# Patient Record
Sex: Male | Born: 1945 | Race: White | Hispanic: No | Smoking: Current every day smoker
Health system: Southern US, Community
[De-identification: ages and names within clinical notes are randomized; demographics above are authoritative.]

## PROBLEM LIST (undated history)

## (undated) DIAGNOSIS — C049 Malignant neoplasm of floor of mouth, unspecified: Secondary | ICD-10-CM

## (undated) DIAGNOSIS — M47812 Spondylosis without myelopathy or radiculopathy, cervical region: Secondary | ICD-10-CM

## (undated) DIAGNOSIS — Z923 Personal history of irradiation: Secondary | ICD-10-CM

## (undated) DIAGNOSIS — C61 Malignant neoplasm of prostate: Secondary | ICD-10-CM

## (undated) DIAGNOSIS — IMO0002 Reserved for concepts with insufficient information to code with codable children: Secondary | ICD-10-CM

## (undated) DIAGNOSIS — I10 Essential (primary) hypertension: Secondary | ICD-10-CM

## (undated) DIAGNOSIS — Z862 Personal history of diseases of the blood and blood-forming organs and certain disorders involving the immune mechanism: Secondary | ICD-10-CM

## (undated) DIAGNOSIS — Z811 Family history of alcohol abuse and dependence: Secondary | ICD-10-CM

## (undated) DIAGNOSIS — F32A Depression, unspecified: Secondary | ICD-10-CM

## (undated) DIAGNOSIS — F329 Major depressive disorder, single episode, unspecified: Secondary | ICD-10-CM

## (undated) HISTORY — DX: Personal history of diseases of the blood and blood-forming organs and certain disorders involving the immune mechanism: Z86.2

## (undated) HISTORY — DX: Reserved for concepts with insufficient information to code with codable children: IMO0002

## (undated) HISTORY — DX: Depression, unspecified: F32.A

## (undated) HISTORY — DX: Major depressive disorder, single episode, unspecified: F32.9

## (undated) HISTORY — PX: TONSILLECTOMY AND ADENOIDECTOMY: SHX28

## (undated) HISTORY — DX: Family history of alcohol abuse and dependence: Z81.1

---

## 2005-10-01 DIAGNOSIS — C049 Malignant neoplasm of floor of mouth, unspecified: Secondary | ICD-10-CM

## 2005-10-01 HISTORY — PX: LESION REMOVAL: SHX5196

## 2005-10-01 HISTORY — DX: Malignant neoplasm of floor of mouth, unspecified: C04.9

## 2006-08-07 ENCOUNTER — Ambulatory Visit (HOSPITAL_COMMUNITY): Admission: RE | Admit: 2006-08-07 | Discharge: 2006-08-08 | Payer: Self-pay | Admitting: Otolaryngology

## 2006-09-06 ENCOUNTER — Ambulatory Visit: Admission: RE | Admit: 2006-09-06 | Discharge: 2006-11-27 | Payer: Self-pay | Admitting: *Deleted

## 2011-10-11 DIAGNOSIS — Z23 Encounter for immunization: Secondary | ICD-10-CM | POA: Diagnosis not present

## 2012-09-27 DIAGNOSIS — Z23 Encounter for immunization: Secondary | ICD-10-CM | POA: Diagnosis not present

## 2013-10-19 DIAGNOSIS — J040 Acute laryngitis: Secondary | ICD-10-CM | POA: Diagnosis not present

## 2013-10-27 DIAGNOSIS — H66009 Acute suppurative otitis media without spontaneous rupture of ear drum, unspecified ear: Secondary | ICD-10-CM | POA: Diagnosis not present

## 2013-12-09 DIAGNOSIS — J029 Acute pharyngitis, unspecified: Secondary | ICD-10-CM | POA: Diagnosis not present

## 2014-05-10 DIAGNOSIS — R5381 Other malaise: Secondary | ICD-10-CM | POA: Diagnosis not present

## 2014-05-10 DIAGNOSIS — Z131 Encounter for screening for diabetes mellitus: Secondary | ICD-10-CM | POA: Diagnosis not present

## 2014-05-10 DIAGNOSIS — Z125 Encounter for screening for malignant neoplasm of prostate: Secondary | ICD-10-CM | POA: Diagnosis not present

## 2014-05-10 DIAGNOSIS — M2550 Pain in unspecified joint: Secondary | ICD-10-CM | POA: Diagnosis not present

## 2014-05-10 DIAGNOSIS — Z136 Encounter for screening for cardiovascular disorders: Secondary | ICD-10-CM | POA: Diagnosis not present

## 2014-07-14 DIAGNOSIS — J410 Simple chronic bronchitis: Secondary | ICD-10-CM | POA: Diagnosis not present

## 2014-07-14 DIAGNOSIS — J040 Acute laryngitis: Secondary | ICD-10-CM | POA: Diagnosis not present

## 2014-07-14 DIAGNOSIS — Z87891 Personal history of nicotine dependence: Secondary | ICD-10-CM | POA: Diagnosis not present

## 2014-07-14 DIAGNOSIS — H903 Sensorineural hearing loss, bilateral: Secondary | ICD-10-CM | POA: Diagnosis not present

## 2014-07-14 DIAGNOSIS — H6063 Unspecified chronic otitis externa, bilateral: Secondary | ICD-10-CM | POA: Diagnosis not present

## 2014-07-14 DIAGNOSIS — J322 Chronic ethmoidal sinusitis: Secondary | ICD-10-CM | POA: Diagnosis not present

## 2014-07-21 DIAGNOSIS — N529 Male erectile dysfunction, unspecified: Secondary | ICD-10-CM | POA: Diagnosis not present

## 2014-07-21 DIAGNOSIS — E780 Pure hypercholesterolemia: Secondary | ICD-10-CM | POA: Diagnosis not present

## 2014-07-21 DIAGNOSIS — Z1389 Encounter for screening for other disorder: Secondary | ICD-10-CM | POA: Diagnosis not present

## 2014-07-21 DIAGNOSIS — Z1211 Encounter for screening for malignant neoplasm of colon: Secondary | ICD-10-CM | POA: Diagnosis not present

## 2014-07-21 DIAGNOSIS — R972 Elevated prostate specific antigen [PSA]: Secondary | ICD-10-CM | POA: Diagnosis not present

## 2014-07-21 DIAGNOSIS — Z23 Encounter for immunization: Secondary | ICD-10-CM | POA: Diagnosis not present

## 2014-07-21 DIAGNOSIS — F329 Major depressive disorder, single episode, unspecified: Secondary | ICD-10-CM | POA: Diagnosis not present

## 2014-07-21 DIAGNOSIS — Z0000 Encounter for general adult medical examination without abnormal findings: Secondary | ICD-10-CM | POA: Diagnosis not present

## 2014-07-26 DIAGNOSIS — H6523 Chronic serous otitis media, bilateral: Secondary | ICD-10-CM | POA: Diagnosis not present

## 2014-07-26 DIAGNOSIS — H6693 Otitis media, unspecified, bilateral: Secondary | ICD-10-CM | POA: Diagnosis not present

## 2014-08-17 DIAGNOSIS — R972 Elevated prostate specific antigen [PSA]: Secondary | ICD-10-CM | POA: Diagnosis not present

## 2014-08-17 DIAGNOSIS — N400 Enlarged prostate without lower urinary tract symptoms: Secondary | ICD-10-CM | POA: Diagnosis not present

## 2014-08-17 DIAGNOSIS — N5201 Erectile dysfunction due to arterial insufficiency: Secondary | ICD-10-CM | POA: Diagnosis not present

## 2014-09-29 DIAGNOSIS — R972 Elevated prostate specific antigen [PSA]: Secondary | ICD-10-CM | POA: Diagnosis not present

## 2014-10-08 DIAGNOSIS — R972 Elevated prostate specific antigen [PSA]: Secondary | ICD-10-CM | POA: Diagnosis not present

## 2014-10-08 DIAGNOSIS — C61 Malignant neoplasm of prostate: Secondary | ICD-10-CM | POA: Diagnosis not present

## 2014-10-15 DIAGNOSIS — C61 Malignant neoplasm of prostate: Secondary | ICD-10-CM | POA: Diagnosis not present

## 2014-10-18 ENCOUNTER — Other Ambulatory Visit (HOSPITAL_COMMUNITY): Payer: Self-pay | Admitting: Urology

## 2014-10-18 DIAGNOSIS — C61 Malignant neoplasm of prostate: Secondary | ICD-10-CM

## 2014-10-29 ENCOUNTER — Encounter (HOSPITAL_COMMUNITY)
Admission: RE | Admit: 2014-10-29 | Discharge: 2014-10-29 | Disposition: A | Discharge status: Home / Self Care | Payer: Medicare HMO | Source: Ambulatory Visit | Attending: Urology | Admitting: Urology

## 2014-10-29 ENCOUNTER — Encounter (HOSPITAL_COMMUNITY): Payer: Self-pay

## 2014-10-29 DIAGNOSIS — C61 Malignant neoplasm of prostate: Secondary | ICD-10-CM | POA: Insufficient documentation

## 2014-10-29 MED ORDER — TECHNETIUM TC 99M MEDRONATE IV KIT
26.0000 | PACK | Freq: Once | INTRAVENOUS | Status: AC | PRN
  Administered 2014-10-29: 26 via INTRAVENOUS

## 2014-11-03 DIAGNOSIS — C61 Malignant neoplasm of prostate: Secondary | ICD-10-CM | POA: Diagnosis not present

## 2014-11-03 DIAGNOSIS — N5201 Erectile dysfunction due to arterial insufficiency: Secondary | ICD-10-CM | POA: Diagnosis not present

## 2014-11-04 ENCOUNTER — Other Ambulatory Visit (HOSPITAL_COMMUNITY): Payer: Self-pay | Admitting: Urology

## 2014-11-04 DIAGNOSIS — C61 Malignant neoplasm of prostate: Secondary | ICD-10-CM

## 2014-11-15 ENCOUNTER — Telehealth: Payer: Self-pay | Admitting: Medical Oncology

## 2014-11-15 NOTE — Telephone Encounter (Signed)
I called pt to introduce myself as the Prostate Nurse Navigator and the Coordinator of the Prostate [place].  1. I confirmed with the patient he is aware of his referral to the clinic by [name]. We confirmed his appointments for 2/23 with arrival of 12:15pm.  2. I discussed the format of the clinic and the physicians he will be seeing that day.  3. I discussed where the clinic is located and how to contact me.  4. I confirmed his address and informed him I would be mailing a packet of information and forms to be completed. I asked him to bring them with him the day of his appointment.   He voiced understanding of the above. I asked him to call me if he has any questions or concerns regarding his appointments or the forms he needs to complete.   [name], RN, BSN, [place]  [phone] Fax [phone]

## 2014-11-17 ENCOUNTER — Ambulatory Visit (HOSPITAL_COMMUNITY)
Admission: RE | Admit: 2014-11-17 | Discharge: 2014-11-17 | Disposition: A | Discharge status: Home / Self Care | Payer: Commercial Managed Care - HMO | Source: Ambulatory Visit | Attending: Urology | Admitting: Urology

## 2014-11-17 ENCOUNTER — Other Ambulatory Visit (HOSPITAL_COMMUNITY): Payer: Self-pay | Admitting: Urology

## 2014-11-17 DIAGNOSIS — C61 Malignant neoplasm of prostate: Secondary | ICD-10-CM

## 2014-11-17 DIAGNOSIS — M4804 Spinal stenosis, thoracic region: Secondary | ICD-10-CM | POA: Insufficient documentation

## 2014-11-17 DIAGNOSIS — R938 Abnormal findings on diagnostic imaging of other specified body structures: Secondary | ICD-10-CM | POA: Diagnosis present

## 2014-11-17 DIAGNOSIS — M5126 Other intervertebral disc displacement, lumbar region: Secondary | ICD-10-CM | POA: Diagnosis not present

## 2014-11-17 LAB — POCT I-STAT CREATININE: Creatinine, Ser: 1.4 mg/dL — ABNORMAL HIGH (ref 0.50–1.35)

## 2014-11-17 MED ORDER — GADOBENATE DIMEGLUMINE 529 MG/ML IV SOLN
20.0000 mL | Freq: Once | INTRAVENOUS | Status: AC | PRN
  Administered 2014-11-17: 20 mL via INTRAVENOUS

## 2014-11-22 ENCOUNTER — Telehealth: Payer: Self-pay | Admitting: Medical Oncology

## 2014-11-22 NOTE — Progress Notes (Addendum)
GU Location of Tumor / Histology: Prostate Cancer: Adenocarcinoma    If Prostate Cancer, Gleason Score is (5 +4=9 ) and PSA is (19.10 on 08/18/2014)  Theodore Lamb presented 3 1/2 years ago ago with some testicular discomfort, which a small spermatocele was discovered.  His prostate exam was unremarkable at that time with a PSA of 3.7 (2012). No PSA record from 2012-2015. There is a family history of prostate cancer.      Past/Anticipated interventions by urology, if any:Dr/ Rana Snare: Prostate Biopsy  Past/Anticipated interventions by medical oncology, if any:   Weight changes, if any: no  Bowel/Bladder complaints, if any: Urinary frequency, post-void dribbling and erectile dysfunction, diarrhea  Nausea/Vomiting, if any: no  Pain issues, if any:  no  SAFETY ISSUES:  Prior radiation? no  Pacemaker/ICD? no  Possible current pregnancy? no  Is the patient on methotrexate? no  Current Complaints / other details:

## 2014-11-22 NOTE — Telephone Encounter (Signed)
I called pt to confirm his appointment for the Prostate [facility] 11/23/14 with arrival at 12:15pm. I asked him to bring his completed paper work and he stated what paper work U asked if her received the packet I mailed him and he stated I am terrible at paper work. I encouraged him to complete it and bring it with him. If he doesn't then I will have a packet he can fill out when he arrives We reviewed where the cancer center is and the [place] parking. He voiced understanding. I asked him to call me if any questions or concerns.   [name], RN, BSN, CRNI Prostate Oncology Navigator [facility]  [phone]  Fax [phone].

## 2014-11-23 ENCOUNTER — Encounter: Payer: Self-pay | Admitting: Radiation Oncology

## 2014-11-23 ENCOUNTER — Encounter: Payer: Self-pay | Admitting: Medical Oncology

## 2014-11-23 ENCOUNTER — Ambulatory Visit (HOSPITAL_BASED_OUTPATIENT_CLINIC_OR_DEPARTMENT_OTHER): Payer: Commercial Managed Care - HMO | Admitting: Oncology

## 2014-11-23 ENCOUNTER — Ambulatory Visit
Admission: RE | Admit: 2014-11-23 | Discharge: 2014-11-23 | Disposition: A | Discharge status: Home / Self Care | Payer: Commercial Managed Care - HMO | Source: Ambulatory Visit | Attending: Radiation Oncology | Admitting: Radiation Oncology

## 2014-11-23 VITALS — BP 134/62 | HR 79 | Temp 98.5°F | Ht 69.0 in | Wt 243.4 lb

## 2014-11-23 DIAGNOSIS — F17200 Nicotine dependence, unspecified, uncomplicated: Secondary | ICD-10-CM | POA: Diagnosis not present

## 2014-11-23 DIAGNOSIS — E780 Pure hypercholesterolemia: Secondary | ICD-10-CM | POA: Diagnosis not present

## 2014-11-23 DIAGNOSIS — Z8042 Family history of malignant neoplasm of prostate: Secondary | ICD-10-CM | POA: Insufficient documentation

## 2014-11-23 DIAGNOSIS — C61 Malignant neoplasm of prostate: Secondary | ICD-10-CM

## 2014-11-23 DIAGNOSIS — N529 Male erectile dysfunction, unspecified: Secondary | ICD-10-CM | POA: Insufficient documentation

## 2014-11-23 DIAGNOSIS — Z85819 Personal history of malignant neoplasm of unspecified site of lip, oral cavity, and pharynx: Secondary | ICD-10-CM | POA: Diagnosis not present

## 2014-11-23 HISTORY — DX: Malignant neoplasm of floor of mouth, unspecified: C04.9

## 2014-11-23 HISTORY — DX: Spondylosis without myelopathy or radiculopathy, cervical region: M47.812

## 2014-11-23 HISTORY — DX: Malignant neoplasm of prostate: C61

## 2014-11-23 NOTE — Consult Note (Signed)
Chief Complaint  Prostate Cancer   Reason For Visit  Reason for consult: To discuss treatment options for high risk prostate cancer. Physician requesting consult: Dr. Rana Snare Location of consult: [place] PCP: Dr. Leighton Ruff   History of Present Illness  Theodore Lamb is a 69 year old gentleman seen today in the [facility].  He was originally seen by Dr. Risa Lamb in 2012 for a spermatocele.  His baseline PSA at that time was 3.7.  He was again seen in November of 2015 after his PSA was noted to have increased to 16.75.  This was repeated by Dr. Risa Lamb and was 19.10.  His DRE was unremarkable.  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 He underwent staging studies on 10/29/14 including a bone scan that demonstrated a focus of uptake at the right 9th thoracic vertebrae that was equivocal for degenerative change vs. a possible solitary metastatic focus and a CT scan of the pelvis that was negative for lymphadenopathy or other evidence of metastatic disease. He also underwent an MRI on 11/17/14 which was not suspicious for metastatic disease.  He does have a paternal family history of prostate cancer with his father having been diagnosed in his 65s.   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  TNM stage: cT1c N0 M0 PSA: 19.10 Gleason score: 5+4=9 Biopsy (10/08/14): 7/12 cores positive    Left: L lateral apex (20%, 3+4=7), L lateral mid (50%, 4+4=8), L mid (60%, 4+5=9), L lateral base (70%,4+3=7), L base (80%, 5+4=9)    Right: R apex (10%, 3+3=6), R lateral apex (10%, 3+3=6, PNI) Prostate volume: 27 cc  Nomogram OC disease: 8% EPE: 88% SVI: 37% LNI: 38% PFS (surgery): 21% at 5 years, 13% at 10 years  Urinary function: He has minimal LUTS. IPSS is 5. Erectile  function: He is not currently sexually active.  He is not currently in a relationship but does say that he feels it would be difficult to obtain an erection likely at this time.   Past Medical History  1. History of Anxiety (F41.9)  2. History of Arthritis  3. History of depression (Z86.59)  4. History of hypercholesterolemia (Z86.39)  5. 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 History of Oral Surgery  2. History of Sinus Surgery  3. History of Tonsillectomy  Current Meds  1. Allegra-D 24 Hour 180-240 MG TB24;  Therapy: (Recorded:17Nov2015) to Recorded  2. Cialis 20 MG Oral Tablet; TAKE 1 TABLET As Directed;  Therapy: [id number] to (Last Rx:03Feb2016)  Requested for: 03Feb2016 Ordered  3. Ibuprofen CAPS;  Therapy: (Recorded:11Jul2012) to Recorded  Allergies  1. No Known Drug Allergies  Family History  1. Family history of Family Health Status - Mother's Age  90. Family history of Father Deceased At Age 38  3. Family history of Prostate Cancer : Father  4. Family history of Renal Cell Carcinoma : Paternal Grandmother  4. Family history of Renal Failure : Maternal Aunt  6. Family history of Testicular Cancer : Paternal Uncle  44. Family history of Tuberculosis : Mother  Social History   Denied: History of Alcohol Use   Current every day smoker (F17.200)   Father deceased   Former smoker [phone])   Marital History - Single   Occupation:  One child  Review of Systems AU Complete-Male: Genitourinary, constitutional, skin, eye, otolaryngeal, hematologic/lymphatic, cardiovascular, pulmonary, endocrine, musculoskeletal, gastrointestinal, neurological and psychiatric system(s) were reviewed and pertinent findings if present are noted and are otherwise negative.    Physical Exam Constitutional: Well nourished and well developed . No acute distress.  ENT:. The ears and nose are normal in appearance.  Neck: The appearance of the neck is  normal and no neck mass is present.  Pulmonary: No respiratory distress and normal respiratory rhythm and effort.  Cardiovascular:. No peripheral edema.  Skin: Normal skin turgor, no visible rash and no visible skin lesions.  Neuro/Psych:. Mood and affect are appropriate.    Results/Data  I have reviewed his medical records, his PSA results, and we have reviewed his pathology slides and imaging studies in the GU multidisciplinary conference today.     Assessment  1. Prostate cancer (C61)  Discussion/Summary  1. High risk localized prostate cancer: I had a detailed discussion with [name] today regarding his prostate cancer and treatment options.  He has seen Dr. Alen Blew and Dr. Valere Dross already today. We reviewed the high risk nature of his disease, need for aggressive multimodality therapy, and potential risk for possible micrometastatic disease.  He understands options for treatment would include primary surgical therapy with a high risk of needing adjuvant or salvage radiation therapy and/or ADT vs. primary radiotherapy plus long term ADT.   The patient was counseled about the natural history of prostate cancer and the standard treatment options that are available for prostate cancer. It was explained to him how his age and life expectancy, clinical stage, Gleason score, and PSA affect his prognosis, the decision to proceed with additional staging studies, as well as how that information influences recommended treatment strategies. We discussed the roles for active surveillance, radiation therapy, surgical therapy, androgen deprivation, as well as ablative therapy options for the treatment of prostate cancer as appropriate to his individual cancer situation. We discussed the risks and benefits of these options with regard to their impact on cancer control and also in terms of potential adverse events, complications, and impact on quiality of life particularly related to urinary, bowel, and  sexual function. The patient was encouraged to ask questions throughout the discussion today and all questions were answered to his stated satisfaction. In addition, the patient was provided with and/or directed to appropriate resources and literature for further education about prostate cancer and treatment options.   He appears to have a very good understanding of the pros and cons of each of the outlined approaches with either radiation therapy plus long term ADT vs. primary surgical therapy.  He appears to be leaning toward radiotherapy plus ADT.  He will let me know if he does elect to proceed with surgery, and if so, I would have him return for a full examination and further discussion regarding recovery and expectations regarding his postoperative quality of life.  cc: Dr. Rana Snare Dr. Leighton Ruff Dr. Zola Button Dr. Arloa Koh  A total of 40 minutes were spent in the overall care of the patient today with 40 minutes in direct face to face consultation.    Signatures Electronically signed by : Raynelle Bring, M.D.; Nov 23 2014  9:46PM EST

## 2014-11-23 NOTE — [facility] Oncology Navigator Note (Signed)
                               Care Plan Summary  Name: [name] DOB: December 14, 1945   Your Medical Team:   [name], [facility]  [name], [facility]   [name], [place]  Recommendations: 1) ADT ( Hormone therapy)   2) Radiation Therapy  3) Prostatectomy   * These recommendations are based on information available as of today's consult.      Recommendations may change depending on the results of further tests or exams.    Next Steps: 1) Appointment with [name] to further discuss treatment    When appointments need to be scheduled, you will be contacted by [facility] and/or [facility].  Questions?  Please do not hesitate to call [name], RN, BSN, CRNI at [phone] any questions or concerns.  [name] is your Oncology Nurse Navigator and is available to assist you while you're receiving your medical care at [facility].

## 2014-11-23 NOTE — Addendum Note (Signed)
Encounter addended by: Raynelle Bring, MD on: 11/23/2014  9:52 PM<BR>     Documentation filed: Clinical Notes

## 2014-11-23 NOTE — Progress Notes (Signed)
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 male who is seen today through the courtesy of Theodore Lamb at the prostate multidisciplinary clinic for evaluation of his stage T1c high risk adenocarcinoma prostate.  He was seen over 3-1/2 years ago for a small spermatocele at which time his PSA was 3.7.  The patient was lost to follow-up and Theodore Lamb obtained a PSA this past August which was 16.75.  He was seen back by  Theodore Lamb in November 2015 and had him scheduled for prostate biopsies on 10/08/2014. His repeat PSA on 08/17/2014 was 19.1. He was found have Gleason 9 (5+4) involving 80% of one core from the left base and also Gleason 9 (4+5) involving 60% of one core from the left mid gland.  He also had Gleason 8 (4+4) involving 50% of one core from the left lateral mid gland and Gleason 7 (4+3) involving 70% of one core from the left lateral base, and Gleason 7 (3+4) involving 20% of one core from the left lateral apex.  He also had Gleason 6 (3+3) involving 10% of one core from right lateral apex and 10% of one core from the right apex. His prostate volume was approximately 30 mL.  His staging workup included a bone scan on 10/29/2014 which did show enhanced uptake along the region of the right portion of the right ninth vertebra.  A follow-up MRI scan on 11/17/2014 he showed that this represented degenerative changes.  A CT scan of the abdomen/pelvis was without evidence for metastatic disease. He is doing well from a GU and GI standpoint.  His  I PSS score is 4.  He does have erectile dysfunction which is improved with  Cialis.  PREVIOUS RADIATION THERAPY: No   PAST MEDICAL HISTORY:  has a past medical history of Depression; Squamous cell carcinoma; FH: alcoholism; H/O leukocytosis; Prostate cancer; Cervical spondylosis; and Cancer of floor of mouth (2007).     PAST SURGICAL HISTORY:  Past Surgical History  Procedure Laterality Date  . Tonsillectomy and adenoidectomy    . Lesion removal  2007    floor of mouth     FAMILY HISTORY: family history includes Colon cancer in an other family member; Testicular cancer in his paternal uncle.  His father was diagnosed with prostate cancer in his 31s and had seed implantation, he died from complications of hip surgery at 47.  His mother is alive and well at 87.   SOCIAL HISTORY:  reports that he has been smoking.  He does not have any smokeless tobacco history on file. He reports that he does not drink alcohol or use illicit drugs.  Never married, one child. 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   ALLERGIES: Viagra   MEDICATIONS:  Current Outpatient Prescriptions  Medication Sig Dispense Refill  . ibuprofen (ADVIL,MOTRIN) 200 MG tablet Take 200 mg by mouth every 6 (six) hours as needed.     No current facility-administered medications for this encounter.     REVIEW OF SYSTEMS:  Pertinent items are noted in HPI.  PHYSICAL EXAM:  height is 5\' 9"  (1.753 m) and weight is 243 lb 6.4 oz (110.406 kg). His temperature is 98.5 F (36.9 C). His blood pressure is 134/62 and his pulse is 79.   Alert and oriented 69 year old white male appearing his stated age.  Rectal examination: I am unable to feel his prostate base but there is no focal induration or nodularity.   LABORATORY DATA:  No results found for: WBC, HGB, HCT, MCV, PLT No results found for: NA, K, CL, CO2 No results found for: ALT, AST, GGT, ALKPHOS, BILITOT  PSA 19.1 from 08/18/2014   IMPRESSION:  StageT1c high risk adenocarcinoma prostate.I explained to the patient  that his prognosis is related to his stage, PSA level, and Gleason score.  His PSA level of 19.1 is of intermediate favorability but his Gleason score of 9 is distinctly unfavorable. Other prognostic factors include PSA doubling time and disease volume, and these are also unfavorable. He has high-risk disease.  We discussed surgery with the possible need for postoperative radiation therapy versus radiation therapy along with androgen deprivation therapy, ideally for 2 years.  We discussed external beam for 5 weeks followed by seed implantation or 8 weeks of external beam/IMRT.  After lengthy discussion he is most interested in external beam/IMRT along with androgen deprivation therapy.  We discussed the potential acute and late toxicities of radiation therapy and also the side effects of androgen deprivation therapy. He will need to be started on androgen deprivation therapy, and I will confirm with the patient that this is what he wants to do.  He would also need placement of 3 gold seed markers for image guidance and see me back for a follow-up visit in 2 months.   PLAN: As discussed above.    I spent 45  minutes face to face with the patient and more than 50% of that time was spent in counseling and/or coordination of care.

## 2014-11-23 NOTE — Consult Note (Signed)
Reason for Referral: Prostate cancer.   HPI: 69 year old gentleman currently of [place] where the recent diagnosis of prostate cancer. He was evaluated 3 years ago by [name] and at that time his PSA was 3.7. A repeat PSA back in November 2015 was up to 16.75 and repeated and it was 19.1. His digital rectal examination did not reveal any abnormalities. On 10/08/2014 he had a biopsy which showed a Gleason score 5+4 = 9 prostate cancer in 1 core and a 4+5 = 9 in another core. He had also 4+4 = 8 in 1 core with 2 cores showed 3+4 = 7. He also had 2 cores of 3+3 = 6. His staging workup including a bone scan which showed some activity at the T9 level but MRI ruled out any metastatic disease. He was referred to the prostate cancer multidisciplinary clinic for an evaluation. Clinically he feels well with very little urinary symptoms. His urine flow is adequate without any major decline. He has not been sexually active as of late. He does not report any headaches or blurry vision or syncope. He does not report any chest pain or palpitation. He does not report any nausea, vomiting abdominal pain. He continues to be active performing activities of daily living without any hindrance or decline.   Past Medical History  Diagnosis Date  . Depression   . 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 FH: alcoholism   . H/O leukocytosis     leukocytosis and erythrocytosis  . Prostate cancer   . Cervical spondylosis   . Cancer of floor of mouth 2007  :  Past Surgical History  Procedure Laterality Date  . Tonsillectomy and adenoidectomy    . Lesion removal  2007    floor of mouth  :   Current outpatient prescriptions:  .  ibuprofen (ADVIL,MOTRIN) 200 MG tablet, Take 200 mg by mouth every 6 (six) hours as needed., Disp: , Rfl: :  Allergies  Allergen Reactions  . Viagra [Sildenafil Citrate]   :  Family History  Problem Relation Age of Onset  . Testicular cancer  Paternal Uncle   . Colon cancer    :  History   Social History  . Marital Status: Divorced    Spouse Name: N/A  . Number of Children: N/A  . Years of Education: N/A   Occupational History  . Not on file.   Social History Main Topics  . Smoking status: Current Every Day Smoker  . Smokeless tobacco: Not on file     Comment: frequency  1 PPD . Smoking : smoking electric cigaretes . Alcohol : no     . Alcohol Use: No  . Drug Use: No  . Sexual Activity: Not on file   Other Topics Concern  . Not on file   Social History Narrative   History of smoking cigarettes :Current smoker,   Frequency : 1 PPD Smoking: smoking electric  cigaretes. Alcohol: no.  :  Pertinent items are noted in HPI.  Exam: ECOG 0 There were no vitals taken for this visit. General appearance: alert and cooperative Head: Normocephalic, without obvious abnormality Throat: lips, mucosa, and tongue normal; teeth and gums normal Neck: no adenopathy Back: negative Resp: clear to auscultation bilaterally Cardio: regular rate and rhythm, S1, S2 normal, no murmur, click, rub or gallop GI: soft, non-tender; bowel sounds normal; no masses,  no organomegaly Extremities: extremities normal, atraumatic, no cyanosis or edema Pulses: 2+ and  symmetric Skin: Skin color, texture, turgor normal. No rashes or lesions Lymph nodes: Cervical, supraclavicular, and axillary nodes normal.     Mr Thoracic Spine W Wo Contrast  11/17/2014   CLINICAL DATA:  69 year old male with prostate cancer and abnormal bone scan in the thoracic spine, approximately T9 level. Subsequent encounter.  EXAM: MRI THORACIC SPINE WITHOUT AND WITH CONTRAST  TECHNIQUE: Multiplanar and multiecho pulse sequences of the thoracic spine were obtained without and with intravenous contrast.  CONTRAST:  45mL MULTIHANCE GADOBENATE DIMEGLUMINE 529 MG/ML IV SOLN  COMPARISON:  Whole-body bone scan 10/29/2014.  FINDINGS: No thoracic spine marrow edema or abnormal  thoracic vertebral marrow enhancement is identified. There is a small benign hemangioma in the posterior T8 vertebral body. No suspicious thoracic vertebral marrow lesion.  Despite thoracic degenerative changes described below, no thoracic spinal cord signal abnormality. No abnormal intradural enhancement.  Negative visualized thoracic and upper abdominal viscera.  The following degenerative changes are noted:  T1-T2: Left paracentral disc protrusion. Uncovertebral and facet hypertrophy. Borderline to mild spinal stenosis. Up to moderate T1 foraminal stenosis.  T2-T3: Moderate to severe facet hypertrophy on the right. Moderate to severe right T2 foraminal stenosis.  T3-T4:  Moderate facet hypertrophy greater on the right.  T4-T5: Circumferential disc bulge. Moderate to severe facet hypertrophy. Mild right T4 foraminal stenosis.  T5-T6: Negative.  T6-T7: Negative.  T7-T8: Small left paracentral disc protrusion.  T8-T9: Small right paracentral disc protrusion.  T9-T10: Right eccentric disc bulge. Moderate facet hypertrophy. Borderline to mild T9 foraminal stenosis.  T10-T11: Moderate to severe facet hypertrophy greater on the right. Mild right T10 foraminal stenosis.  T11-T12:  Mild disc bulge and facet hypertrophy.  T12-L1:  Disc bulge.  IMPRESSION: 1. No metastatic disease identified in the thoracic spine. Suspect degenerative etiology for the recent bone scan findings. 2. Thoracic spine degenerative changes including occasional spinal and foraminal stenosis. No thoracic spinal cord signal abnormality.   Electronically Signed   By: Genevie Ann M.D.   On: 11/17/2014 16:26   Nm Bone Scan Whole Body  10/29/2014   CLINICAL DATA:  Prostate cancer.  EXAM: NUCLEAR MEDICINE WHOLE BODY BONE SCAN  TECHNIQUE: Whole body anterior and posterior images were obtained approximately 3 hours after intravenous injection of radiopharmaceutical.  RADIOPHARMACEUTICALS:  26 mCi Technetium-99 MDP  COMPARISON:  No prior.  FINDINGS: Bilateral  renal function and excretion. Punctate area of increased activity noted over the region of right ninth vertebrae . This may be secondary to degenerative change, gadolinium-enhanced thoracic spine MRI can be obtained to further evaluate for a solitary metastatic lesion. No other bony abnormalities noted to suggest metastatic disease.  IMPRESSION: Punctate increased activity noted over the region of the right portion of the right ninth vertebrae. This may be degenerative. Gadolinium-enhanced MRI of the thoracic spine however should be considered to evaluate for the possibility of a solitary metastatic focus. No other bony abnormalities noted to suggest metastatic disease.   Electronically Signed   By: Marcello Moores  Register   On: 10/29/2014 14:31    Assessment and Plan:   69 year old gentleman with diagnosis of prostate cancer with a PSA of 19.1 and a Gleason score of 5+4 = 9. His staging workup did not reveal any evidence of metastatic disease. His case was discussed today in the prostate cancer multidisciplinary clinic. His pathology was reviewed with the reviewing pathologist as well as his imaging studies were discussed with radiology. Options of treatments were discussed today with the patient which include radical prostatectomy  versus radiation therapy with androgen deprivation. He is in excellent health and shape and certainly both options are reasonable but he is favoring radiation treatment at this time. I discussed with him the role of androgen deprivation for at least 16-24 months and the complications associated with that. These would include fatigue, tiredness, hot flashes among others. It appears to be a reasonable option and he will consider that. All his questions were answered today.

## 2014-11-23 NOTE — Addendum Note (Signed)
Encounter addended by: Rexene Edison, MD on: 11/23/2014  5:36 PM<BR>     Documentation filed: Arn Medal VN

## 2014-11-23 NOTE — Progress Notes (Signed)
Please see consult note.  

## 2014-11-24 ENCOUNTER — Encounter: Payer: Self-pay | Admitting: Radiation Oncology

## 2014-11-24 ENCOUNTER — Telehealth: Payer: Self-pay | Admitting: *Deleted

## 2014-11-24 NOTE — Progress Notes (Signed)
CC: Dr. Rana Snare   Chart note:  I spoke with Mr. Scholze again today, and he has decided to proceed with androgen deprivation therapy with external beam/IMRT.  I spoke with Dr. Cy Blamer nurse today and she will get him set up for initiation of androgen deprivation therapy which, ideally, should be given for a total of 2 years.  I will also give him scheduled for placement of 3 gold seed markers, and a follow-up visit with me in 2 months to proceed with scheduling of treatment planning.

## 2014-11-24 NOTE — Telephone Encounter (Signed)
CALLED PATIENT TO INFORM OF GOLD SEED PLACEMENT @ DR. GRAPEY'S OFFICE ON 01-20-15 - ARRIVAL TIME - 9:30 AM AND HIS FNC VISIT WITH DR. Valere Dross ON 01/25/15 @ 8:30 AM, LVM FOR A RETURN CALL

## 2014-11-25 NOTE — Addendum Note (Signed)
Encounter addended by: Gwenette Greet, RN on: 11/25/2014 10:34 AM<BR>     Documentation filed: Inpatient Patient Education

## 2014-11-26 ENCOUNTER — Encounter: Payer: Self-pay | Admitting: *Deleted

## 2014-11-26 DIAGNOSIS — C61 Malignant neoplasm of prostate: Secondary | ICD-10-CM | POA: Diagnosis not present

## 2014-11-26 NOTE — Progress Notes (Signed)
Prostate Multidisciplinary Clinic  Clinical Social Work  Clinical Social Work met with patient prostate multidisciplinary clinic to offer support and assess for psychosocial needs. The patient scored a 6 on the Psychosocial Distress Thermometer which indicates moderate distress. Mr. Santillo shared he is somewhat apprehensive about the plan, but experiencing more distress in his personal life.  He shared he has recently seperated from his spouse and is experiencing spousal conflict.  CSW provided brief emotional support and discussed possible interventions to help cope with family issues such as counseling by a community provider or through [facility] counseling interns.  He shared he enjoys fishing and "talks with his fishing buddies" about issues he may experience.  ONCBCN DISTRESS SCREENING 11/23/2014  Screening Type Initial Screening  Distress experienced in past week (1-10) 6  Practical problem type Work/school  Family Problem type Partner  Emotional problem type Depression;Nervousness/Anxiety;Adjusting to illness;Isolation/feeling alone  Spiritual/Religous concerns type Facing my mortality  Physical Problem type Pain;Sleep/insomnia;Tingling hands/feet  Physician notified of physical symptoms Yes  Referral to clinical psychology No  Referral to clinical social work Yes  Referral to dietition No  Referral to financial advocate No  Referral to support programs Yes  Referral to palliative care No     Clinical Social Work briefly discussed Clinical Social Work role and [employer] support programs/services. Mr. Nawrot expressed interest in the prostate group and TaiChi class.  Clinical Social Work encouraged patient to call with any additional questions or concerns.   Polo Riley, MSW, LCSW, OSW-C  Clinical Social Worker  [facility]  [phone]

## 2015-01-11 DIAGNOSIS — J069 Acute upper respiratory infection, unspecified: Secondary | ICD-10-CM | POA: Diagnosis not present

## 2015-01-11 DIAGNOSIS — J209 Acute bronchitis, unspecified: Secondary | ICD-10-CM | POA: Diagnosis not present

## 2015-01-25 ENCOUNTER — Ambulatory Visit: Admission: RE | Admit: 2015-01-25 | Payer: Commercial Managed Care - HMO | Source: Ambulatory Visit

## 2015-01-25 ENCOUNTER — Ambulatory Visit
Admission: RE | Admit: 2015-01-25 | Payer: Commercial Managed Care - HMO | Source: Ambulatory Visit | Admitting: Radiation Oncology

## 2015-02-01 ENCOUNTER — Ambulatory Visit
Admission: RE | Admit: 2015-02-01 | Discharge: 2015-02-01 | Disposition: A | Discharge status: Home / Self Care | Payer: Commercial Managed Care - HMO | Source: Ambulatory Visit | Attending: Radiation Oncology | Admitting: Radiation Oncology

## 2015-02-01 ENCOUNTER — Encounter: Payer: Self-pay | Admitting: Medical Oncology

## 2015-02-01 ENCOUNTER — Encounter: Payer: Self-pay | Admitting: Radiation Oncology

## 2015-02-01 VITALS — BP 153/69 | HR 69 | Temp 97.9°F | Resp 20 | Ht 68.0 in | Wt 238.0 lb

## 2015-02-01 DIAGNOSIS — C61 Malignant neoplasm of prostate: Secondary | ICD-10-CM

## 2015-02-01 NOTE — Progress Notes (Signed)
CC: Dr. Leighton Ruff, Dr. 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  I saw him with Dr. Alinda Money at the prostate multidisciplinary clinic on 11/23/2014.  He was seen over 3-1/2 years ago for a small spermatocele at which time his PSA was 3.7. The patient was lost to follow-up and Dr. Leighton Ruff obtained a PSA this past August which was 16.75. He was seen back by [name] in November 2015 and had him scheduled for prostate biopsies on 10/08/2014. His repeat PSA on 08/17/2014 was 19.1. He was found have Gleason 9 (5+4) involving 80% of one core from the left base and also Gleason 9 (4+5) involving 60% of one core from the left mid gland. He also had Gleason 8 (4+4) involving 50% of one core from the left lateral mid gland and Gleason 7 (4+3) involving 70% of one core from the left lateral base, and Gleason 7 (3+4) involving 20% of one core from the left lateral apex. He also had Gleason 6 (3+3) involving 10% of one core from right lateral apex and 10% of one core from the right apex. His prostate volume was approximately 30 mL. His staging workup included a bone scan on 10/29/2014 which did show enhanced uptake along the region of the right portion of the right ninth vertebra. A follow-up MRI scan on 11/17/2014 he showed that this represented degenerative changes. A CT scan of the abdomen/pelvis was without evidence for metastatic disease.  His I PSS score was 4.  He has a history of erectile dysfunction which improved with Cialis.  He elected for androgen deprivation therapy with  weeks of external beam/IMRT.  I understand that he began androgen deprivation therapy in late February.  He does report hot flashes and some fatigue along with loss of sex drive.   He is scheduled for placement of 3 gold seed markers with [name] early next week.  Physical examination: Alert and oriented. Filed Vitals:   02/01/15 0750  BP: 153/69  Pulse: 69  Temp: 97.9 F (36.6 C)  Resp: 20   Rectal examination not performed today.  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  I explained to the patient the natural history of prostate cancer.  He has a very high risk for extracapsular extension and at least a 30% chance for lymph node involvement and seminal vesicle invasion.  I do recommend prostate, seminal vesicle, and nodal irradiation.  We discussed the potential acute and late toxicities of radiation therapy.  We discussed the concept of comfortable bladder filling to minimize urinary toxicity.  He will have placement of 3 gold seed markers next week by [name], and I'll have him return here for CT simulation on May 16.  He'll begin his radiation therapy in late May.  Consent is signed today.  Plan: As above.  30 minutes was spent face-to-face with the patient, primarily counseling patient and coordinating his care. Marland Kitchen

## 2015-02-01 NOTE — Progress Notes (Addendum)
Follow up new consult Prostate cancer, he is scheduled for gold seed placement next week with [name], no dysuria, has good stream, no hematuria, no hesitancy, does have frequency/urgency, at times doesn't empty fully he feel;s, only c/o chronic back pain and arthritis,  Appetite fair, , no nausea, eats 2 meals a day,  Energy level getting fatigued easily regular bowel movements, still having hot flashes worse at nights sweaty  (3-4am) drinks lots water 8:02 AM BP 153/69 mmHg  Pulse 69  Temp(Src) 97.9 F (36.6 C) (Oral)  Resp 20  Ht 5\' 8"  (1.727 m)  Wt 238 lb (107.956 kg)  BMI 36.20 kg/m2  Wt Readings from Last 3 Encounters:  11/17/14 240 lb (108.863 kg)

## 2015-02-01 NOTE — [facility] Oncology Navigator Note (Signed)
I met with Theodore Lamb today after his follow up visit with Dr. Valere Dross. He has decided to move forward with radiation. He is scheduled for his CT simulation on May 16. During his initial visit to the Prostate [employer] he was given a calender of our monthly support groups and other activities offered here at [facility]. He asked me if I had a listing of these programs. I  gave him a May calender of events and explained that they may change from month to month.   All questions were answered and I gave him my business card and asked him to call me with questions or concerns. He voiced understanding.    Theodore Rue, RN, BSN, [place]  [phone]  Fax [phone]

## 2015-02-01 NOTE — Progress Notes (Signed)
Please see the Nurse Progress Note in the MD Initial Consult Encounter for this patient. 

## 2015-02-07 DIAGNOSIS — C61 Malignant neoplasm of prostate: Secondary | ICD-10-CM | POA: Diagnosis not present

## 2015-02-14 ENCOUNTER — Ambulatory Visit
Admission: RE | Admit: 2015-02-14 | Discharge: 2015-02-14 | Disposition: A | Discharge status: Home / Self Care | Payer: Commercial Managed Care - HMO | Source: Ambulatory Visit | Attending: Radiation Oncology | Admitting: Radiation Oncology

## 2015-02-14 ENCOUNTER — Encounter: Payer: Self-pay | Admitting: Medical Oncology

## 2015-02-14 DIAGNOSIS — C61 Malignant neoplasm of prostate: Secondary | ICD-10-CM

## 2015-02-14 NOTE — Progress Notes (Signed)
[name] here for simulation today. No complaints or concerns this time. He did state he did receive a note from [facility] questioning his referral. I asked him to call Dr. Cy Blamer office who made the referral. He voiced understanding and will call me back if he can not get this issue resolved.

## 2015-02-14 NOTE — Progress Notes (Signed)
Simulation/treatment planning note: The patient was taken to the CT simulator.  A Vac lock immobilization device was constructed.  A red rubber cath was placed within the rectal vault.  He was then catheterized and contrast instilled into the bladder/urethra.  The CT data set was sent to the  MIM planning system where contoured his prostate, seminal vesicles, bladder, rectum, rectosigmoid colon, and lymph nodes CTV.  The LN CTV (CTV56) and seminal vesicles will be expanded by 0.5 cm to create respective PTV's which will receive 5600 cGy in 40 sessions.  His prostate PTV represents the prostate +0.8 cm except for 0.5 cm along the rectum.  He is now ready for IMRT simulation/treatment planning.

## 2015-02-16 ENCOUNTER — Encounter: Payer: Self-pay | Admitting: Radiation Oncology

## 2015-02-16 DIAGNOSIS — C61 Malignant neoplasm of prostate: Secondary | ICD-10-CM | POA: Diagnosis not present

## 2015-02-16 NOTE — Progress Notes (Signed)
IMRT simulation/treatment planning note: The patient completed IMRT treatment planning for treatment the management of his carcinoma prostate.  I were to is chosen to decrease the risk for both acute and late bladder and rectal toxicity compared to 3-D conformal or conventional radiation therapy.  Dose volume histograms were obtained for the target structures including the prostate, seminal vesicles, and pelvic lymph nodes.  Dose volume histograms were obtained for the avoidance structures including the bladder, rectum, femoral heads, and bowel.  We met our departmental guidelines.  I'm prescribing 7800 cGy in 40 sessions to his prostate PTV and 5600cGy to his seminal vesicle, and pelvic lymph node PTV's.  He is being treated with helical IMRT Tomotherapy with 6 MV photons.

## 2015-02-17 DIAGNOSIS — C61 Malignant neoplasm of prostate: Secondary | ICD-10-CM | POA: Diagnosis not present

## 2015-02-23 ENCOUNTER — Ambulatory Visit
Admission: RE | Admit: 2015-02-23 | Discharge: 2015-02-23 | Disposition: A | Discharge status: Home / Self Care | Payer: Commercial Managed Care - HMO | Source: Ambulatory Visit | Attending: Radiation Oncology | Admitting: Radiation Oncology

## 2015-02-23 DIAGNOSIS — C61 Malignant neoplasm of prostate: Secondary | ICD-10-CM | POA: Diagnosis not present

## 2015-02-23 NOTE — Progress Notes (Signed)
Chart note: The patient began his IMRT Tomotherapy today in the management of his carcinoma the prostate.  He is being treated with 9.3 delivered field widths corresponding to one set of IMRT treatment devices [phone]).

## 2015-02-24 ENCOUNTER — Ambulatory Visit
Admission: RE | Admit: 2015-02-24 | Discharge: 2015-02-24 | Disposition: A | Discharge status: Home / Self Care | Payer: Commercial Managed Care - HMO | Source: Ambulatory Visit | Attending: Radiation Oncology | Admitting: Radiation Oncology

## 2015-02-24 DIAGNOSIS — C61 Malignant neoplasm of prostate: Secondary | ICD-10-CM | POA: Diagnosis not present

## 2015-02-25 ENCOUNTER — Ambulatory Visit
Admission: RE | Admit: 2015-02-25 | Discharge: 2015-02-25 | Disposition: A | Discharge status: Home / Self Care | Payer: Commercial Managed Care - HMO | Source: Ambulatory Visit | Attending: Radiation Oncology | Admitting: Radiation Oncology

## 2015-02-25 DIAGNOSIS — C61 Malignant neoplasm of prostate: Secondary | ICD-10-CM | POA: Diagnosis not present

## 2015-03-01 ENCOUNTER — Ambulatory Visit
Admission: RE | Admit: 2015-03-01 | Discharge: 2015-03-01 | Disposition: A | Discharge status: Home / Self Care | Payer: Commercial Managed Care - HMO | Source: Ambulatory Visit | Attending: Radiation Oncology | Admitting: Radiation Oncology

## 2015-03-01 VITALS — BP 128/63 | HR 69 | Temp 98.1°F | Wt 241.0 lb

## 2015-03-01 DIAGNOSIS — C61 Malignant neoplasm of prostate: Secondary | ICD-10-CM | POA: Diagnosis not present

## 2015-03-01 NOTE — Progress Notes (Signed)
Weekly Management Note:  Site: Prostate/pelvic lymph nodes Current Dose:  780  cGy Projected Dose: 7800  cGy  Narrative: The patient is seen today for routine under treatment assessment. CBCT/MVCT images/port films were reviewed. The chart was reviewed.   Bladder filling is satisfactory but less than ideal.  He did not feel that his bladder was full today.  No new GU or GI difficulty.  Physical Examination:  Filed Vitals:   03/01/15 1425  BP: 128/63  Pulse: 69  Temp: 98.1 F (36.7 C)  .  Weight: 241 lb (109.317 kg).  No change.  Impression: Tolerating radiation therapy well.  I encouraged him to improve his bladder filling.  Plan: Continue radiation therapy as planned.

## 2015-03-01 NOTE — Addendum Note (Signed)
Encounter addended by: Norm Salt, RN on: 03/01/2015  4:48 PM<BR>     Documentation filed: Notes Section

## 2015-03-01 NOTE — Progress Notes (Signed)
Weekly assessment of radiation to pelvis for prostate cancer.Reviewed clinic routine.Reinforced need to have full bladder for treatment.[facility] Radiation Therapy and You Booklet to read.Dr.Murray's nurse to review side effects and answer questions during future appointment.Denies pain.No bowel or bladder problems.

## 2015-03-02 ENCOUNTER — Encounter: Payer: Self-pay | Admitting: Medical Oncology

## 2015-03-02 ENCOUNTER — Ambulatory Visit
Admission: RE | Admit: 2015-03-02 | Discharge: 2015-03-02 | Disposition: A | Discharge status: Home / Self Care | Payer: Commercial Managed Care - HMO | Source: Ambulatory Visit | Attending: Radiation Oncology | Admitting: Radiation Oncology

## 2015-03-02 DIAGNOSIS — C61 Malignant neoplasm of prostate: Secondary | ICD-10-CM | POA: Diagnosis not present

## 2015-03-02 NOTE — Progress Notes (Signed)
Oncology Nurse Navigator Documentation  Oncology Nurse Navigator Flowsheets 02/14/2015 02/14/2015 03/02/2015  Referral date to RadOnc/MedOnc 11/23/2014 - -  Navigator Encounter Type - - Treatment  Patient Visit Type Follow-up - Radonc-Pt has completed his first week of radiation. He states no problems and he is tolerating well.  Treatment Phase CT SIM CT SIM Treatment  Barriers/Navigation Needs - - No barriers at this time  Time Spent with Patient - - 15

## 2015-03-03 ENCOUNTER — Ambulatory Visit
Admission: RE | Admit: 2015-03-03 | Discharge: 2015-03-03 | Disposition: A | Discharge status: Home / Self Care | Payer: Commercial Managed Care - HMO | Source: Ambulatory Visit | Attending: Radiation Oncology | Admitting: Radiation Oncology

## 2015-03-03 DIAGNOSIS — C61 Malignant neoplasm of prostate: Secondary | ICD-10-CM | POA: Diagnosis not present

## 2015-03-04 ENCOUNTER — Ambulatory Visit
Admission: RE | Admit: 2015-03-04 | Discharge: 2015-03-04 | Disposition: A | Discharge status: Home / Self Care | Payer: Commercial Managed Care - HMO | Source: Ambulatory Visit | Attending: Radiation Oncology | Admitting: Radiation Oncology

## 2015-03-04 DIAGNOSIS — C61 Malignant neoplasm of prostate: Secondary | ICD-10-CM | POA: Diagnosis not present

## 2015-03-07 ENCOUNTER — Encounter: Payer: Self-pay | Admitting: Radiation Oncology

## 2015-03-07 ENCOUNTER — Ambulatory Visit
Admission: RE | Admit: 2015-03-07 | Discharge: 2015-03-07 | Disposition: A | Discharge status: Home / Self Care | Payer: Commercial Managed Care - HMO | Source: Ambulatory Visit | Attending: Radiation Oncology | Admitting: Radiation Oncology

## 2015-03-07 VITALS — BP 151/69 | HR 64 | Resp 16 | Wt 243.8 lb

## 2015-03-07 DIAGNOSIS — C61 Malignant neoplasm of prostate: Secondary | ICD-10-CM | POA: Diagnosis not present

## 2015-03-07 NOTE — Progress Notes (Signed)
Weekly Management Note:  Site: Prostate/pelvic lymph nodes Current Dose:  1560  cGy Projected Dose: 7800  cGy  Narrative: The patient is seen today for routine under treatment assessment. CBCT/MVCT images/port films were reviewed. The chart was reviewed.   Bladder filling is satisfactory.  No significant GU or GI difficulties.  Physical Examination:  Filed Vitals:   03/07/15 1719  BP: 151/69  Pulse: 64  Resp: 16  .  Weight: 243 lb 12.8 oz (110.587 kg).  No change.  Impression: Tolerating radiation therapy well.  Plan: Continue radiation therapy as planned.

## 2015-03-07 NOTE — Progress Notes (Signed)
Weight and vitals stable. Denies pain. Reports fatigue. Reports dysuria. Denies hematuria. Reports he feels as though his pelvis is sunburned. Reports his scrotum itches. Reports two episodes of diarrhea last week. Denies nocturia. Denies urinary leakage. Reports urgency only first thing each morning.   BP 151/69 mmHg  Pulse 64  Resp 16  Wt 243 lb 12.8 oz (110.587 kg) Wt Readings from Last 3 Encounters:  03/01/15 241 lb (109.317 kg)  11/17/14 240 lb (108.863 kg)

## 2015-03-08 ENCOUNTER — Ambulatory Visit
Admission: RE | Admit: 2015-03-08 | Discharge: 2015-03-08 | Disposition: A | Discharge status: Home / Self Care | Payer: Commercial Managed Care - HMO | Source: Ambulatory Visit | Attending: Radiation Oncology | Admitting: Radiation Oncology

## 2015-03-08 DIAGNOSIS — C61 Malignant neoplasm of prostate: Secondary | ICD-10-CM | POA: Diagnosis not present

## 2015-03-09 ENCOUNTER — Ambulatory Visit
Admission: RE | Admit: 2015-03-09 | Discharge: 2015-03-09 | Disposition: A | Discharge status: Home / Self Care | Payer: Commercial Managed Care - HMO | Source: Ambulatory Visit | Attending: Radiation Oncology | Admitting: Radiation Oncology

## 2015-03-09 DIAGNOSIS — C61 Malignant neoplasm of prostate: Secondary | ICD-10-CM | POA: Diagnosis not present

## 2015-03-10 ENCOUNTER — Ambulatory Visit
Admission: RE | Admit: 2015-03-10 | Discharge: 2015-03-10 | Disposition: A | Discharge status: Home / Self Care | Payer: Commercial Managed Care - HMO | Source: Ambulatory Visit | Attending: Radiation Oncology | Admitting: Radiation Oncology

## 2015-03-10 DIAGNOSIS — C61 Malignant neoplasm of prostate: Secondary | ICD-10-CM | POA: Diagnosis not present

## 2015-03-11 ENCOUNTER — Ambulatory Visit
Admission: RE | Admit: 2015-03-11 | Discharge: 2015-03-11 | Disposition: A | Discharge status: Home / Self Care | Payer: Commercial Managed Care - HMO | Source: Ambulatory Visit | Attending: Radiation Oncology | Admitting: Radiation Oncology

## 2015-03-11 DIAGNOSIS — C61 Malignant neoplasm of prostate: Secondary | ICD-10-CM | POA: Diagnosis not present

## 2015-03-14 ENCOUNTER — Ambulatory Visit
Admission: RE | Admit: 2015-03-14 | Discharge: 2015-03-14 | Disposition: A | Discharge status: Home / Self Care | Payer: Commercial Managed Care - HMO | Source: Ambulatory Visit | Attending: Radiation Oncology | Admitting: Radiation Oncology

## 2015-03-14 ENCOUNTER — Encounter: Payer: Self-pay | Admitting: Medical Oncology

## 2015-03-14 ENCOUNTER — Encounter: Payer: Self-pay | Admitting: Radiation Oncology

## 2015-03-14 VITALS — BP 158/81 | HR 65 | Resp 16 | Wt 242.8 lb

## 2015-03-14 DIAGNOSIS — C61 Malignant neoplasm of prostate: Secondary | ICD-10-CM

## 2015-03-14 NOTE — Progress Notes (Signed)
Oncology Nurse Navigator Documentation  Oncology Nurse Navigator Flowsheets 02/14/2015 02/14/2015 03/02/2015 03/14/2015  Referral date to RadOnc/MedOnc 11/23/2014 - - -  Navigator Encounter Type - - Treatment Treatment-Pt states he is doing well with radiation. He has no complaints.  Patient Visit Type Follow-up - Radonc Radonc  Treatment Phase CT SIM CT SIM Treatment Treatment  Barriers/Navigation Needs - - No barriers at this time -  Time Spent with Patient - - 15 15

## 2015-03-14 NOTE — Progress Notes (Signed)
   Weekly Management Note:  Outpatient    ICD-9-CM ICD-10-CM   1. Prostate cancer 185 C61     Current Dose:  25.35 Gy  Projected Dose: 78 Gy   Narrative:  The patient presents for routine under treatment assessment.  CBCT/MVCT images/Port film x-rays were reviewed.  The chart was checked.  Denies pain or fatigue. Denies any significant GU or GI difficulties.   Physical Findings:  weight is 242 lb 12.8 oz (110.133 kg). His blood pressure is 158/81 and his pulse is 65. His respiration is 16.   Wt Readings from Last 3 Encounters:  03/14/15 242 lb 12.8 oz (110.133 kg)  03/07/15 243 lb 12.8 oz (110.587 kg)  03/01/15 241 lb (109.317 kg)   NAD  Impression:  The patient is tolerating radiotherapy.  Plan:  Continue radiotherapy as planned.   This document serves as a record of services personally performed by Eppie Gibson, MD. It was created on her behalf by Arlyce Harman, a trained medical scribe. The creation of this record is based on the scribe's personal observations and the provider's statements to them. This document has been checked and approved by the attending provider. ________________________________   Eppie Gibson, M.D.

## 2015-03-14 NOTE — Progress Notes (Signed)
Weight and vitals stable. Denies pain or fatigue. Denies any significant GU or GI difficulties.

## 2015-03-15 ENCOUNTER — Ambulatory Visit
Admission: RE | Admit: 2015-03-15 | Discharge: 2015-03-15 | Disposition: A | Discharge status: Home / Self Care | Payer: Commercial Managed Care - HMO | Source: Ambulatory Visit | Attending: Radiation Oncology | Admitting: Radiation Oncology

## 2015-03-15 DIAGNOSIS — C61 Malignant neoplasm of prostate: Secondary | ICD-10-CM | POA: Diagnosis not present

## 2015-03-16 ENCOUNTER — Ambulatory Visit
Admission: RE | Admit: 2015-03-16 | Discharge: 2015-03-16 | Disposition: A | Discharge status: Home / Self Care | Payer: Commercial Managed Care - HMO | Source: Ambulatory Visit | Attending: Radiation Oncology | Admitting: Radiation Oncology

## 2015-03-16 DIAGNOSIS — C61 Malignant neoplasm of prostate: Secondary | ICD-10-CM | POA: Diagnosis not present

## 2015-03-17 ENCOUNTER — Ambulatory Visit
Admission: RE | Admit: 2015-03-17 | Discharge: 2015-03-17 | Disposition: A | Discharge status: Home / Self Care | Payer: Commercial Managed Care - HMO | Source: Ambulatory Visit | Attending: Radiation Oncology | Admitting: Radiation Oncology

## 2015-03-17 DIAGNOSIS — C61 Malignant neoplasm of prostate: Secondary | ICD-10-CM | POA: Diagnosis not present

## 2015-03-18 ENCOUNTER — Ambulatory Visit
Admission: RE | Admit: 2015-03-18 | Discharge: 2015-03-18 | Disposition: A | Discharge status: Home / Self Care | Payer: Commercial Managed Care - HMO | Source: Ambulatory Visit | Attending: Radiation Oncology | Admitting: Radiation Oncology

## 2015-03-18 DIAGNOSIS — C61 Malignant neoplasm of prostate: Secondary | ICD-10-CM | POA: Diagnosis not present

## 2015-03-21 ENCOUNTER — Ambulatory Visit
Admission: RE | Admit: 2015-03-21 | Discharge: 2015-03-21 | Disposition: A | Discharge status: Home / Self Care | Payer: Commercial Managed Care - HMO | Source: Ambulatory Visit | Attending: Radiation Oncology | Admitting: Radiation Oncology

## 2015-03-21 VITALS — BP 143/74 | HR 70 | Temp 99.0°F | Ht 68.0 in | Wt 243.0 lb

## 2015-03-21 DIAGNOSIS — C61 Malignant neoplasm of prostate: Secondary | ICD-10-CM | POA: Diagnosis not present

## 2015-03-21 NOTE — Progress Notes (Signed)
Weekly Management Note:  Site: Prostate/pelvic lymph nodes Current Dose:  3510  cGy Projected Dose: 7800  cGy  Narrative: The patient is seen today for routine under treatment assessment. CBCT/MVCT images/port films were reviewed. The chart was reviewed.   Bladder filling satisfactory.  He does have intermittent loose stools which is not unexpected.  He also has moderate fatigue.  Physical Examination:  Filed Vitals:   03/21/15 1630  BP: 143/74  Pulse: 70  Temp: 99 F (37.2 C)  .  Weight: 243 lb (110.224 kg).  No change.  Impression: Tolerating radiation therapy well.  I told he may want to get on a low residue diet to avoid loose bowel movements.  Plan: Continue radiation therapy as planned.

## 2015-03-21 NOTE — Progress Notes (Signed)
Mr. Juncaj has received 18 fractions to his pelvis for prostate cancer.  He states that he has to get up at ~ 3 am to void and, at times, he has needle like sensations in his bladder and he attributes this to 64 oz of water he drinks prior to each radiation therapy treatment, since it "stretches his bladder".  He reports intermittent loose stools and he feels his rectal area has been sunburned.     He also reports fatigue and going to bed earlier than his normal.

## 2015-03-22 ENCOUNTER — Ambulatory Visit
Admission: RE | Admit: 2015-03-22 | Discharge: 2015-03-22 | Disposition: A | Discharge status: Home / Self Care | Payer: Commercial Managed Care - HMO | Source: Ambulatory Visit | Attending: Radiation Oncology | Admitting: Radiation Oncology

## 2015-03-22 DIAGNOSIS — C61 Malignant neoplasm of prostate: Secondary | ICD-10-CM | POA: Diagnosis not present

## 2015-03-23 ENCOUNTER — Ambulatory Visit
Admission: RE | Admit: 2015-03-23 | Discharge: 2015-03-23 | Disposition: A | Discharge status: Home / Self Care | Payer: Commercial Managed Care - HMO | Source: Ambulatory Visit | Attending: Radiation Oncology | Admitting: Radiation Oncology

## 2015-03-23 DIAGNOSIS — C61 Malignant neoplasm of prostate: Secondary | ICD-10-CM | POA: Diagnosis not present

## 2015-03-24 ENCOUNTER — Ambulatory Visit
Admission: RE | Admit: 2015-03-24 | Discharge: 2015-03-24 | Disposition: A | Discharge status: Home / Self Care | Payer: Commercial Managed Care - HMO | Source: Ambulatory Visit | Attending: Radiation Oncology | Admitting: Radiation Oncology

## 2015-03-24 DIAGNOSIS — C61 Malignant neoplasm of prostate: Secondary | ICD-10-CM | POA: Diagnosis not present

## 2015-03-25 ENCOUNTER — Ambulatory Visit
Admission: RE | Admit: 2015-03-25 | Discharge: 2015-03-25 | Disposition: A | Discharge status: Home / Self Care | Payer: Commercial Managed Care - HMO | Source: Ambulatory Visit | Attending: Radiation Oncology | Admitting: Radiation Oncology

## 2015-03-25 DIAGNOSIS — C61 Malignant neoplasm of prostate: Secondary | ICD-10-CM | POA: Diagnosis not present

## 2015-03-28 ENCOUNTER — Ambulatory Visit
Admission: RE | Admit: 2015-03-28 | Discharge: 2015-03-28 | Disposition: A | Discharge status: Home / Self Care | Payer: Commercial Managed Care - HMO | Source: Ambulatory Visit | Attending: Radiation Oncology | Admitting: Radiation Oncology

## 2015-03-28 ENCOUNTER — Encounter: Payer: Self-pay | Admitting: Radiation Oncology

## 2015-03-28 VITALS — BP 159/83 | Temp 98.2°F | Ht 68.0 in | Wt 242.7 lb

## 2015-03-28 DIAGNOSIS — C61 Malignant neoplasm of prostate: Secondary | ICD-10-CM | POA: Diagnosis not present

## 2015-03-28 NOTE — Progress Notes (Signed)
Theodore Lamb has received 23 fractions to his pelvis. Reports less pressure of urinary stream, with needle like sensation at the beginning of his urinary stream.  After bedtime he voids daily at ~ 5 am.  He reports that he has a decrease in rectal irritation at this time with intermittent loose stools.

## 2015-03-28 NOTE — Progress Notes (Signed)
Weekly Management Note:  Site: Prostate/pelvic lymph nodes Current Dose:  4485  cGy Projected Dose: 7800  cGy  Narrative: The patient is seen today for routine under treatment assessment. CBCT/MVCT images/port films were reviewed. The chart was reviewed.   Bladder filling satisfactory.  No new significant GU or GI difficulties.  Physical Examination:  Filed Vitals:   03/28/15 1646  BP: 159/83  Temp: 98.2 F (36.8 C)  .  Weight: 242 lb 11.2 oz (110.088 kg).  No change.  Impression: Tolerating radiation therapy well.  Plan: Continue radiation therapy as planned.

## 2015-03-29 ENCOUNTER — Ambulatory Visit
Admission: RE | Admit: 2015-03-29 | Discharge: 2015-03-29 | Disposition: A | Discharge status: Home / Self Care | Payer: Commercial Managed Care - HMO | Source: Ambulatory Visit | Attending: Radiation Oncology | Admitting: Radiation Oncology

## 2015-03-29 DIAGNOSIS — C61 Malignant neoplasm of prostate: Secondary | ICD-10-CM | POA: Diagnosis not present

## 2015-03-30 ENCOUNTER — Ambulatory Visit
Admission: RE | Admit: 2015-03-30 | Discharge: 2015-03-30 | Disposition: A | Discharge status: Home / Self Care | Payer: Commercial Managed Care - HMO | Source: Ambulatory Visit | Attending: Radiation Oncology | Admitting: Radiation Oncology

## 2015-03-30 ENCOUNTER — Encounter: Payer: Self-pay | Admitting: Medical Oncology

## 2015-03-30 DIAGNOSIS — C61 Malignant neoplasm of prostate: Secondary | ICD-10-CM | POA: Diagnosis not present

## 2015-03-31 ENCOUNTER — Ambulatory Visit
Admission: RE | Admit: 2015-03-31 | Discharge: 2015-03-31 | Disposition: A | Discharge status: Home / Self Care | Payer: Commercial Managed Care - HMO | Source: Ambulatory Visit | Attending: Radiation Oncology | Admitting: Radiation Oncology

## 2015-03-31 DIAGNOSIS — C61 Malignant neoplasm of prostate: Secondary | ICD-10-CM | POA: Diagnosis not present

## 2015-03-31 DIAGNOSIS — Z5111 Encounter for antineoplastic chemotherapy: Secondary | ICD-10-CM | POA: Diagnosis not present

## 2015-04-01 ENCOUNTER — Ambulatory Visit
Admission: RE | Admit: 2015-04-01 | Discharge: 2015-04-01 | Disposition: A | Discharge status: Home / Self Care | Payer: Commercial Managed Care - HMO | Source: Ambulatory Visit | Attending: Radiation Oncology | Admitting: Radiation Oncology

## 2015-04-01 DIAGNOSIS — C61 Malignant neoplasm of prostate: Secondary | ICD-10-CM | POA: Diagnosis not present

## 2015-04-05 ENCOUNTER — Ambulatory Visit
Admission: RE | Admit: 2015-04-05 | Discharge: 2015-04-05 | Disposition: A | Discharge status: Home / Self Care | Payer: Commercial Managed Care - HMO | Source: Ambulatory Visit | Attending: Radiation Oncology | Admitting: Radiation Oncology

## 2015-04-05 ENCOUNTER — Encounter: Payer: Self-pay | Admitting: Radiation Oncology

## 2015-04-05 VITALS — BP 142/86 | HR 67 | Resp 16 | Wt 238.2 lb

## 2015-04-05 DIAGNOSIS — C61 Malignant neoplasm of prostate: Secondary | ICD-10-CM | POA: Diagnosis not present

## 2015-04-05 NOTE — Progress Notes (Signed)
Weight and vitals stable. Reports dysuria at the beginning of each void. Reports nocturia x1. Reports intermittent loose stool. Reports rectal irritation has resolved. Reports bowel urgency. Reports weak urine stream. Reports fatigue.  Resp 16  Wt 238 lb 3.2 oz (108.047 kg) Wt Readings from Last 3 Encounters:  04/05/15 238 lb 3.2 oz (108.047 kg)  03/28/15 242 lb 11.2 oz (110.088 kg)  03/21/15 243 lb (110.224 kg)

## 2015-04-05 NOTE — Progress Notes (Signed)
Weekly Management Note:  Site: Prostate/pelvic lymph nodes Current Dose:  5460  cGy Projected Dose: 7800  cGy  Narrative: The patient is seen today for routine under treatment assessment. CBCT/MVCT images/port films were reviewed. The chart was reviewed.   Bladder filling satisfactory.  He does have slight dysuria on initiation of his urinary stream.  Less rectal irritation.  Occasional loosening of his bowels as expected.  Physical Examination:  Filed Vitals:   04/05/15 0920  BP: 142/86  Pulse: 67  Resp: 16  .  Weight: 238 lb 3.2 oz (108.047 kg).  No change.  Impression: Tolerating radiation therapy well.  Plan: Continue radiation therapy as planned.

## 2015-04-06 ENCOUNTER — Ambulatory Visit
Admission: RE | Admit: 2015-04-06 | Discharge: 2015-04-06 | Disposition: A | Discharge status: Home / Self Care | Payer: Commercial Managed Care - HMO | Source: Ambulatory Visit | Attending: Radiation Oncology | Admitting: Radiation Oncology

## 2015-04-06 DIAGNOSIS — C61 Malignant neoplasm of prostate: Secondary | ICD-10-CM | POA: Diagnosis not present

## 2015-04-07 ENCOUNTER — Ambulatory Visit
Admission: RE | Admit: 2015-04-07 | Discharge: 2015-04-07 | Disposition: A | Discharge status: Home / Self Care | Payer: Commercial Managed Care - HMO | Source: Ambulatory Visit | Attending: Radiation Oncology | Admitting: Radiation Oncology

## 2015-04-07 DIAGNOSIS — C61 Malignant neoplasm of prostate: Secondary | ICD-10-CM | POA: Diagnosis not present

## 2015-04-08 ENCOUNTER — Ambulatory Visit
Admission: RE | Admit: 2015-04-08 | Discharge: 2015-04-08 | Disposition: A | Discharge status: Home / Self Care | Payer: Commercial Managed Care - HMO | Source: Ambulatory Visit | Attending: Radiation Oncology | Admitting: Radiation Oncology

## 2015-04-08 DIAGNOSIS — C61 Malignant neoplasm of prostate: Secondary | ICD-10-CM | POA: Diagnosis not present

## 2015-04-11 ENCOUNTER — Ambulatory Visit
Admission: RE | Admit: 2015-04-11 | Discharge: 2015-04-11 | Disposition: A | Discharge status: Home / Self Care | Payer: Commercial Managed Care - HMO | Source: Ambulatory Visit | Attending: Radiation Oncology | Admitting: Radiation Oncology

## 2015-04-11 ENCOUNTER — Encounter: Payer: Self-pay | Admitting: Radiation Oncology

## 2015-04-11 VITALS — BP 143/87 | HR 76 | Temp 98.6°F | Ht 68.0 in | Wt 235.7 lb

## 2015-04-11 DIAGNOSIS — C61 Malignant neoplasm of prostate: Secondary | ICD-10-CM

## 2015-04-11 NOTE — Progress Notes (Addendum)
[name] has received 32 fractions to his pelvis for Prostate cancer. He reports that his urinary stream is not as forceful, and notes a stinging sensation at the start of his stream.  He reports that he intermittent loose stools but states he was constipated this am.  He also notes fatigue,"I don't have any punch anymore." Last Anti-androgen injection was given 2 weeks.

## 2015-04-11 NOTE — Progress Notes (Signed)
   Weekly Management Note:  outpatient    ICD-9-CM ICD-10-CM   1. Prostate cancer 185 C61     Current Dose:  62.4 Gy  Projected Dose: 78 Gy   Narrative:  The patient presents for routine under treatment assessment.  CBCT/MVCT images/Port film x-rays were reviewed.  The chart was checked. [name] has received 32 fractions to his pelvis for Prostate cancer. He reports that his urinary stream is not as forceful, and notes a stinging sensation at the start of his stream.  He reports that he intermittent loose stools but states he was constipated this am.  He also notes fatigue,"I don't have any punch anymore." Last Anti-androgen injection was given 2 weeks.  Physical Findings:  height is 5\' 8"  (1.727 m) and weight is 235 lb 11.2 oz (106.913 kg). His temperature is 98.6 F (37 C). His blood pressure is 143/87 and his pulse is 76.   Wt Readings from Last 3 Encounters:  04/11/15 235 lb 11.2 oz (106.913 kg)  04/05/15 238 lb 3.2 oz (108.047 kg)  03/28/15 242 lb 11.2 oz (110.088 kg)   NAD  Impression:  The patient is tolerating radiotherapy.  Plan:  Continue radiotherapy as planned.    ________________________________   Eppie Gibson, M.D.

## 2015-04-12 ENCOUNTER — Ambulatory Visit
Admission: RE | Admit: 2015-04-12 | Discharge: 2015-04-12 | Disposition: A | Discharge status: Home / Self Care | Payer: Commercial Managed Care - HMO | Source: Ambulatory Visit | Attending: Radiation Oncology | Admitting: Radiation Oncology

## 2015-04-12 DIAGNOSIS — C61 Malignant neoplasm of prostate: Secondary | ICD-10-CM | POA: Diagnosis not present

## 2015-04-13 ENCOUNTER — Ambulatory Visit
Admission: RE | Admit: 2015-04-13 | Discharge: 2015-04-13 | Disposition: A | Discharge status: Home / Self Care | Payer: Commercial Managed Care - HMO | Source: Ambulatory Visit | Attending: Radiation Oncology | Admitting: Radiation Oncology

## 2015-04-13 DIAGNOSIS — C61 Malignant neoplasm of prostate: Secondary | ICD-10-CM | POA: Diagnosis not present

## 2015-04-14 ENCOUNTER — Ambulatory Visit
Admission: RE | Admit: 2015-04-14 | Discharge: 2015-04-14 | Disposition: A | Discharge status: Home / Self Care | Payer: Commercial Managed Care - HMO | Source: Ambulatory Visit | Attending: Radiation Oncology | Admitting: Radiation Oncology

## 2015-04-14 DIAGNOSIS — C61 Malignant neoplasm of prostate: Secondary | ICD-10-CM | POA: Diagnosis not present

## 2015-04-15 ENCOUNTER — Ambulatory Visit
Admission: RE | Admit: 2015-04-15 | Discharge: 2015-04-15 | Disposition: A | Discharge status: Home / Self Care | Payer: Commercial Managed Care - HMO | Source: Ambulatory Visit | Attending: Radiation Oncology | Admitting: Radiation Oncology

## 2015-04-15 DIAGNOSIS — C61 Malignant neoplasm of prostate: Secondary | ICD-10-CM | POA: Diagnosis not present

## 2015-04-18 ENCOUNTER — Ambulatory Visit
Admission: RE | Admit: 2015-04-18 | Discharge: 2015-04-18 | Disposition: A | Discharge status: Home / Self Care | Payer: Commercial Managed Care - HMO | Source: Ambulatory Visit | Attending: Radiation Oncology | Admitting: Radiation Oncology

## 2015-04-18 VITALS — BP 149/72 | HR 68 | Temp 98.0°F | Wt 237.4 lb

## 2015-04-18 DIAGNOSIS — C61 Malignant neoplasm of prostate: Secondary | ICD-10-CM | POA: Diagnosis not present

## 2015-04-18 NOTE — Progress Notes (Signed)
[name] has received 37 fractions to his pelvis for prostate cancer.  He continues to have needle-like sensation at the end of his urinary and intermittent dribbling.  Nocturia, on average, 1-2 times. Reports changes from soft to loose stools intermittently. Had one episode of uncontrolled diarrhea on Friday last week, but states not further episodes.

## 2015-04-18 NOTE — Progress Notes (Signed)
Weekly Management Note:  Site: Prostate/pelvic lymph nodes Current Dose:  7215  cGy Projected Dose: 7800  cGy  Narrative: The patient is seen today for routine under treatment assessment. CBCT/MVCT images/port films were reviewed. The chart was reviewed.   Bladder filling is satisfactory.  No new GU or GI difficulties.  He does report some diminution of his force of urinary stream.  He also had 1 episode of diarrhea but this has not recurred.  Physical Examination:  Filed Vitals:   04/18/15 1617  BP: 149/72  Pulse: 68  Temp: 98 F (36.7 C)  .  Weight: 237 lb 6.4 oz (107.684 kg).  No change.  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  He will try to control this with a low residue diet.  Plan: Continue radiation therapy as planned.  One-month follow-up after completion of radiation therapy.

## 2015-04-19 ENCOUNTER — Ambulatory Visit
Admission: RE | Admit: 2015-04-19 | Discharge: 2015-04-19 | Disposition: A | Discharge status: Home / Self Care | Payer: Commercial Managed Care - HMO | Source: Ambulatory Visit | Attending: Radiation Oncology | Admitting: Radiation Oncology

## 2015-04-19 DIAGNOSIS — C61 Malignant neoplasm of prostate: Secondary | ICD-10-CM | POA: Diagnosis not present

## 2015-04-20 ENCOUNTER — Ambulatory Visit
Admission: RE | Admit: 2015-04-20 | Discharge: 2015-04-20 | Disposition: A | Discharge status: Home / Self Care | Payer: Commercial Managed Care - HMO | Source: Ambulatory Visit | Attending: Radiation Oncology | Admitting: Radiation Oncology

## 2015-04-20 DIAGNOSIS — C61 Malignant neoplasm of prostate: Secondary | ICD-10-CM | POA: Diagnosis not present

## 2015-04-21 ENCOUNTER — Encounter: Payer: Self-pay | Admitting: Medical Oncology

## 2015-04-21 ENCOUNTER — Encounter: Payer: Self-pay | Admitting: Radiation Oncology

## 2015-04-21 ENCOUNTER — Ambulatory Visit
Admission: RE | Admit: 2015-04-21 | Discharge: 2015-04-21 | Disposition: A | Discharge status: Home / Self Care | Payer: Commercial Managed Care - HMO | Source: Ambulatory Visit | Attending: Radiation Oncology | Admitting: Radiation Oncology

## 2015-04-21 DIAGNOSIS — C61 Malignant neoplasm of prostate: Secondary | ICD-10-CM | POA: Diagnosis not present

## 2015-04-21 NOTE — Progress Notes (Signed)
Oncology Nurse Navigator Documentation  Oncology Nurse Navigator Flowsheets 03/14/2015 03/30/2015 04/21/2015  Referral date to RadOnc/MedOnc - - -  Navigator Encounter Type Treatment Treatment Treatment-Celebrated with patient as he rang the bell. He thanked everyone for the great care he received. He will follow up with Dr. Valere Dross in one month. I asked him to call me with any questions or concerns. He requested a calender with the support groups here at [facility]. I encouraged him to join the Prostate support group and other activities we offer.   Patient Visit Type - - Radonc  Treatment Phase - - Final Radiation Tx  Barriers/Navigation Needs - - -  Time Spent with Patient - - 15

## 2015-04-21 NOTE — Progress Notes (Signed)
[facility] End of Treatment Note  Name:Theodore Lamb  Date: 04/21/2015 SHU:[id number] DOB:09-21-1946   Status:outpatient    CC: Gerrit Heck, MD  Dr. Rana Snare  REFERRING PHYSICIAN:   Dr. Rana Snare   DIAGNOSIS:  Clinical stage T1c high-risk adenocarcinoma prostate    INDICATION FOR TREATMENT: Curative   TREATMENT DATES: 02/23/2015 through 04/21/2015                          SITE/DOSE: Prostate 7800 cGy in 40 sessions, seminal vesicles, and pelvic lymph nodes 5600 cGy in 40 sessions                         BEAMS/ENERGY:  Helical IMRT Tomotherapy with 6 MV photons                 NARRATIVE:  Theodore Lamb tolerated his treatment well with only some loosening of his bowels and slowing of his urinary stream by completion of therapy.  He also had moderate fatigue felt to be secondary to both androgen deprivation therapy and his external beam treatment.                    PLAN: Routine followup in one month. Patient instructed to call if questions or worsening complaints in interim.

## 2015-05-24 ENCOUNTER — Encounter: Payer: Self-pay | Admitting: Radiation Oncology

## 2015-05-25 ENCOUNTER — Encounter: Payer: Self-pay | Admitting: Radiation Oncology

## 2015-05-25 ENCOUNTER — Ambulatory Visit
Admission: RE | Admit: 2015-05-25 | Discharge: 2015-05-25 | Disposition: A | Discharge status: Home / Self Care | Payer: Commercial Managed Care - HMO | Source: Ambulatory Visit | Attending: Radiation Oncology | Admitting: Radiation Oncology

## 2015-05-25 VITALS — BP 130/81 | HR 71 | Temp 97.5°F | Ht 68.0 in | Wt 223.6 lb

## 2015-05-25 DIAGNOSIS — C61 Malignant neoplasm of prostate: Secondary | ICD-10-CM

## 2015-05-25 HISTORY — DX: Personal history of irradiation: Z92.3

## 2015-05-25 NOTE — Progress Notes (Addendum)
[name] here for reassessment s/p xrt to his prostate.  He reports feeling depressed with difficulty concentrating since starting Androgen deprivation with 2nd injedtion 5-6 weeks ago. and also experiencing fatigue and periods of weakness and "shaking".  He also feels as if his hand/eye coordination is off.  He is having "pain/discomfort in all of his joints" with lumbar pain and his eyes are more sensitive to light.  Also notes periods of SOB or palpitations.  Also concerned about his bills.

## 2015-05-25 NOTE — Progress Notes (Signed)
CC: Dr. Rana Snare, Dr. Leighton Ruff  Follow-up note:  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  He tells me that he feels somewhat depressed with difficulty concentrating and severe fatigue since beginning  androgen deprivation therapy earlier this year.  He tells me he had his last injection approximate 6 weeks ago and he believes that this was a "four-month shot".    He would like to consider stopping androgen deprivation therapy based on his change in quality of life.  He is doing well from a GU standpoint although he does have occasional discomfort with urination, "but no pain".  He does not have any nocturia.  He does have occasional rectal urgency but is otherwise doing well from a GI standpoint.  He is not yet have an appointment to go back to see [name].  The physical examination: Alert and oriented. Filed Vitals:   05/25/15 1017  BP: 130/81  Pulse: 71  Temp: 97.5 F (36.4 C)   Rectal examination not performed today.  Impression: He has recovered recently well from a GU and GI standpoint.  He is concerned about his quality of life and whether not he needs to continue with his androgen deprivation therapy.  I quoted him a 15% improvement in overall survival, long-term, with standard androgen deprivation therapy for 2 years when given in conjunction with radiation therapy for high risk disease.  He is aware that androgen deprivation therapy may cause dementia, but this risk is very very low.  He tells me he will make an appointment to see [name] within the next few months.  Plan: Follow-up with [name] for discussion of future Lupron shots and future PSA determinations.  I've not scheduled the patient for a formal follow-up visit, but I would be more than happy see him in the future should the need  arise.  I ask that [name] keep me posted on his progress.

## 2015-06-09 DIAGNOSIS — C61 Malignant neoplasm of prostate: Secondary | ICD-10-CM | POA: Diagnosis not present

## 2015-06-13 DIAGNOSIS — C61 Malignant neoplasm of prostate: Secondary | ICD-10-CM | POA: Diagnosis not present

## 2015-07-20 ENCOUNTER — Telehealth: Payer: Self-pay | Admitting: Medical Oncology

## 2015-07-20 NOTE — Telephone Encounter (Signed)
Oncology Nurse Navigator Documentation  Oncology Nurse Navigator Flowsheets 03/30/2015 04/21/2015 07/20/2015  Referral date to RadOnc/MedOnc - - -  Navigator Encounter Type Treatment Treatment Telephone;3 month-Left a voice mail requesting a return call to follow up 3 month post radiation tx.  Patient Visit Type - Radonc -  Treatment Phase - Final Radiation Tx -  Barriers/Navigation Needs - - -  Time Spent with Patient - 15 -

## 2015-07-21 ENCOUNTER — Telehealth: Payer: Self-pay | Admitting: Medical Oncology

## 2015-07-21 NOTE — Telephone Encounter (Signed)
Pt returned my call and left a message stating that he is doing well. He has a few questions and asked me to return his call. I called him back to get the answering machine. I will continue to call him so we can talk.

## 2015-07-25 DIAGNOSIS — R002 Palpitations: Secondary | ICD-10-CM | POA: Diagnosis not present

## 2015-07-25 DIAGNOSIS — Z1389 Encounter for screening for other disorder: Secondary | ICD-10-CM | POA: Diagnosis not present

## 2015-07-25 DIAGNOSIS — Z0000 Encounter for general adult medical examination without abnormal findings: Secondary | ICD-10-CM | POA: Diagnosis not present

## 2015-07-25 DIAGNOSIS — R0609 Other forms of dyspnea: Secondary | ICD-10-CM | POA: Diagnosis not present

## 2015-07-25 DIAGNOSIS — E7800 Pure hypercholesterolemia, unspecified: Secondary | ICD-10-CM | POA: Diagnosis not present

## 2015-07-25 DIAGNOSIS — Z1211 Encounter for screening for malignant neoplasm of colon: Secondary | ICD-10-CM | POA: Diagnosis not present

## 2015-07-25 DIAGNOSIS — R5383 Other fatigue: Secondary | ICD-10-CM | POA: Diagnosis not present

## 2015-07-25 DIAGNOSIS — C61 Malignant neoplasm of prostate: Secondary | ICD-10-CM | POA: Diagnosis not present

## 2015-08-16 ENCOUNTER — Telehealth: Payer: Self-pay | Admitting: Medical Oncology

## 2015-08-16 NOTE — Telephone Encounter (Signed)
Oncology Nurse Navigator Documentation  Oncology Nurse Navigator Flowsheets 04/21/2015 07/20/2015 08/16/2015  Referral date to RadOnc/MedOnc - - -  Navigator Encounter Type Treatment Telephone;3 month Telephone- Left [name] a message to return my call. He had some questions when I called him for his 3 month follow up. I have attempted to reach him but I have been unsuccessful.  Patient Visit Type Radonc - -  Treatment Phase Final Radiation Tx - -  Barriers/Navigation Needs - - -  Time Spent with Patient 15 - -

## 2015-09-13 DIAGNOSIS — R002 Palpitations: Secondary | ICD-10-CM | POA: Diagnosis not present

## 2015-09-13 DIAGNOSIS — R0602 Shortness of breath: Secondary | ICD-10-CM | POA: Diagnosis not present

## 2015-09-13 DIAGNOSIS — E668 Other obesity: Secondary | ICD-10-CM | POA: Diagnosis not present

## 2015-09-14 DIAGNOSIS — R002 Palpitations: Secondary | ICD-10-CM | POA: Diagnosis not present

## 2015-09-14 DIAGNOSIS — R0602 Shortness of breath: Secondary | ICD-10-CM | POA: Diagnosis not present

## 2015-09-14 DIAGNOSIS — E668 Other obesity: Secondary | ICD-10-CM | POA: Diagnosis not present

## 2015-09-16 DIAGNOSIS — R002 Palpitations: Secondary | ICD-10-CM | POA: Diagnosis not present

## 2015-10-17 DIAGNOSIS — R002 Palpitations: Secondary | ICD-10-CM | POA: Diagnosis not present

## 2015-10-17 DIAGNOSIS — R0609 Other forms of dyspnea: Secondary | ICD-10-CM | POA: Diagnosis not present

## 2015-10-17 DIAGNOSIS — E668 Other obesity: Secondary | ICD-10-CM | POA: Diagnosis not present

## 2015-10-17 DIAGNOSIS — R0602 Shortness of breath: Secondary | ICD-10-CM | POA: Diagnosis not present

## 2015-12-29 DIAGNOSIS — C61 Malignant neoplasm of prostate: Secondary | ICD-10-CM | POA: Diagnosis not present

## 2016-01-06 DIAGNOSIS — C61 Malignant neoplasm of prostate: Secondary | ICD-10-CM | POA: Diagnosis not present

## 2016-01-06 DIAGNOSIS — N5201 Erectile dysfunction due to arterial insufficiency: Secondary | ICD-10-CM | POA: Diagnosis not present

## 2016-01-06 DIAGNOSIS — Z0000 Encounter for general adult medical examination without abnormal findings: Secondary | ICD-10-CM | POA: Diagnosis not present

## 2016-07-12 DIAGNOSIS — C61 Malignant neoplasm of prostate: Secondary | ICD-10-CM | POA: Diagnosis not present

## 2016-07-18 DIAGNOSIS — C61 Malignant neoplasm of prostate: Secondary | ICD-10-CM | POA: Diagnosis not present

## 2016-07-26 DIAGNOSIS — E7800 Pure hypercholesterolemia, unspecified: Secondary | ICD-10-CM | POA: Diagnosis not present

## 2016-07-26 DIAGNOSIS — N529 Male erectile dysfunction, unspecified: Secondary | ICD-10-CM | POA: Diagnosis not present

## 2016-07-26 DIAGNOSIS — Z23 Encounter for immunization: Secondary | ICD-10-CM | POA: Diagnosis not present

## 2016-07-26 DIAGNOSIS — Z0000 Encounter for general adult medical examination without abnormal findings: Secondary | ICD-10-CM | POA: Diagnosis not present

## 2016-07-26 DIAGNOSIS — C61 Malignant neoplasm of prostate: Secondary | ICD-10-CM | POA: Diagnosis not present

## 2016-07-26 DIAGNOSIS — Z1389 Encounter for screening for other disorder: Secondary | ICD-10-CM | POA: Diagnosis not present

## 2016-07-26 DIAGNOSIS — Z1211 Encounter for screening for malignant neoplasm of colon: Secondary | ICD-10-CM | POA: Diagnosis not present

## 2016-07-26 DIAGNOSIS — F339 Major depressive disorder, recurrent, unspecified: Secondary | ICD-10-CM | POA: Diagnosis not present

## 2016-07-26 DIAGNOSIS — K625 Hemorrhage of anus and rectum: Secondary | ICD-10-CM | POA: Diagnosis not present

## 2016-08-14 DIAGNOSIS — H524 Presbyopia: Secondary | ICD-10-CM | POA: Diagnosis not present

## 2016-08-14 DIAGNOSIS — H2513 Age-related nuclear cataract, bilateral: Secondary | ICD-10-CM | POA: Diagnosis not present

## 2016-08-14 DIAGNOSIS — H5203 Hypermetropia, bilateral: Secondary | ICD-10-CM | POA: Diagnosis not present

## 2016-12-30 ENCOUNTER — Emergency Department (HOSPITAL_COMMUNITY)
Admission: EM | Admit: 2016-12-30 | Discharge: 2016-12-30 | Disposition: A | Discharge status: Home / Self Care | Payer: Medicare HMO | Attending: Physician Assistant | Admitting: Physician Assistant

## 2016-12-30 ENCOUNTER — Emergency Department (HOSPITAL_COMMUNITY): Payer: Medicare HMO

## 2016-12-30 ENCOUNTER — Encounter (HOSPITAL_COMMUNITY): Payer: Self-pay | Admitting: Emergency Medicine

## 2016-12-30 DIAGNOSIS — F17200 Nicotine dependence, unspecified, uncomplicated: Secondary | ICD-10-CM | POA: Insufficient documentation

## 2016-12-30 DIAGNOSIS — R0789 Other chest pain: Secondary | ICD-10-CM | POA: Diagnosis present

## 2016-12-30 DIAGNOSIS — R1013 Epigastric pain: Secondary | ICD-10-CM | POA: Diagnosis not present

## 2016-12-30 DIAGNOSIS — R0602 Shortness of breath: Secondary | ICD-10-CM | POA: Diagnosis not present

## 2016-12-30 DIAGNOSIS — Z8546 Personal history of malignant neoplasm of prostate: Secondary | ICD-10-CM | POA: Insufficient documentation

## 2016-12-30 LAB — COMPREHENSIVE METABOLIC PANEL
ALT: 16 U/L — ABNORMAL LOW (ref 17–63)
AST: 19 U/L (ref 15–41)
Albumin: 3.7 g/dL (ref 3.5–5.0)
Alkaline Phosphatase: 41 U/L (ref 38–126)
Anion gap: 8 (ref 5–15)
BUN: 18 mg/dL (ref 6–20)
CO2: 25 mmol/L (ref 22–32)
Calcium: 9.4 mg/dL (ref 8.9–10.3)
Chloride: 108 mmol/L (ref 101–111)
Creatinine, Ser: 1.09 mg/dL (ref 0.61–1.24)
GFR calc Af Amer: >60 mL/min (ref >60)
GFR calc non Af Amer: >60 mL/min (ref >60)
Glucose, Bld: 100 mg/dL — ABNORMAL HIGH (ref 65–99)
Potassium: 4.1 mmol/L (ref 3.5–5.1)
Sodium: 141 mmol/L (ref 135–145)
Total Bilirubin: 0.9 mg/dL (ref 0.3–1.2)
Total Protein: 6.4 g/dL — ABNORMAL LOW (ref 6.5–8.1)

## 2016-12-30 LAB — CBC WITH DIFFERENTIAL/PLATELET
Basophils Absolute: 0 10*3/uL (ref 0.0–0.1)
Basophils Relative: 0 %
Eosinophils Absolute: 0.1 10*3/uL (ref 0.0–0.7)
Eosinophils Relative: 2 %
HCT: 44.6 % (ref 39.0–52.0)
Hemoglobin: 15 g/dL (ref 13.0–17.0)
Lymphocytes Relative: 12 %
Lymphs Abs: 1 10*3/uL (ref 0.7–4.0)
MCH: 30.4 pg (ref 26.0–34.0)
MCHC: 33.6 g/dL (ref 30.0–36.0)
MCV: 90.3 fL (ref 78.0–100.0)
Monocytes Absolute: 0.5 10*3/uL (ref 0.1–1.0)
Monocytes Relative: 6 %
Neutro Abs: 7 10*3/uL (ref 1.7–7.7)
Neutrophils Relative %: 80 %
Platelets: 201 10*3/uL (ref 150–400)
RBC: 4.94 MIL/uL (ref 4.22–5.81)
RDW: 13.2 % (ref 11.5–15.5)
WBC: 8.7 10*3/uL (ref 4.0–10.5)

## 2016-12-30 LAB — I-STAT TROPONIN, ED
Troponin i, poc: 0 ng/mL (ref 0.00–0.08)
Troponin i, poc: 0 ng/mL (ref 0.00–0.08)

## 2016-12-30 MED ORDER — OMEPRAZOLE 20 MG PO CPDR: 20.0000 mg | DELAYED_RELEASE_CAPSULE | Freq: Every day | ORAL | 0 refills | Status: DC

## 2016-12-30 MED ORDER — GI COCKTAIL ~~LOC~~
30.0000 mL | Freq: Once | ORAL | Status: AC
  Administered 2016-12-30: 30 mL via ORAL
  Filled 2016-12-30: qty 30

## 2016-12-30 NOTE — ED Provider Notes (Signed)
[facility] DEPT Provider Note   CSN: [id number] Arrival date & time: 12/30/16  4492     History   Chief Complaint Chief Complaint  Patient presents with  . Chest Pain    HPI Theodore Lamb is a 71 y.o. male.  HPI  Patient is a 71 year old male presenting with chest heaviness that started yesterday. Patient reports that he [place] and had an episode of dizziness to cause him to walk outside and tried to catch his breath. Patient reports that it disappeared and then came back last night and then again this morning with a little bit of chest pressure in the central chest.  Patient denies any shortness breath no diaphoresis did not move to his jaw or either arm. Patient says it's still there. He says he thinks it's made a little bit worse with eating. Patient has no risk factors for pulmonary embolism.  Patient h denies taking any medication for hypertension. Denies any diabetes. Endorses hyperlipidemia.   Past Medical History:  Diagnosis Date  . 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 Cervical spondylosis   . Depression   . FH: alcoholism   . H/O leukocytosis    leukocytosis and erythrocytosis  . Prostate cancer ([place])   . S/P radiation therapy 02/23/2015 through 04/21/2015   Prostate 7800 cGy in 40 sessions, seminal vesicles, and pelvic lymph nodes 5600 cGy in 40 sessions   . 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 Prostate cancer ([place]) 11/23/2014    Past Surgical History:  Procedure Laterality Date  . LESION REMOVAL  2007   floor of mouth  . TONSILLECTOMY AND ADENOIDECTOMY         Home Medications    Prior to Admission medications   Medication Sig Start Date End Date Taking? Authorizing Provider  omeprazole (PRILOSEC) 20 MG capsule Take 1 capsule (20 mg total) by mouth daily. 12/30/16   Corryn Madewell Lyn Velda Wendt, MD    Family  History Family History  Problem Relation Age of Onset  . Testicular cancer Paternal Uncle   . Colon cancer      Social History Social History  Substance Use Topics  . Smoking status: Current Every Day Smoker  . Smokeless tobacco: Current User     Comment: frequency  1 PPD . Smoking : smoking electric cigaretes . Alcohol : no     . Alcohol use No     Allergies   Patient has no known allergies.   Review of Systems Review of Systems  Constitutional: Negative for activity change.  Respiratory: Positive for chest tightness and shortness of breath.   Cardiovascular: Positive for chest pain.  Gastrointestinal: Negative for abdominal pain.  All other systems reviewed and are negative.    Physical Exam Updated Vital Signs BP (!) 153/69   Pulse (!) 49   Temp 98 F (36.7 C) (Oral)   Resp 20   SpO2 97%   Physical Exam  Constitutional: He is oriented to person, place, and time. He appears well-nourished.  HENT:  Head: Normocephalic.  Eyes: Conjunctivae are normal.  Cardiovascular: Normal rate and regular rhythm.   No murmur heard. Pulmonary/Chest: Effort normal and breath sounds normal. No respiratory distress.  Abdominal: Soft. He exhibits no distension. There is no tenderness.  Neurological: He is oriented to person, place, and time.  Skin: Skin is warm and dry.  He is not diaphoretic.  Psychiatric: He has a normal mood and affect. His behavior is normal.     ED Treatments / Results  Labs (all labs ordered are listed, but only abnormal results are displayed) Labs Reviewed  COMPREHENSIVE METABOLIC PANEL - Abnormal; Notable for the following:       Result Value   Glucose, Bld 100 (*)    Total Protein 6.4 (*)    ALT 16 (*)    All other components within normal limits  CBC WITH DIFFERENTIAL/PLATELET  Randolm Idol, ED  Randolm Idol, ED    EKG  EKG Interpretation  Date/Time:  Sunday December 30 2016 10:36:39 EDT Ventricular Rate:  60 PR Interval:    QRS  Duration: 97 QT Interval:  425 QTC Calculation: 425 R Axis:   71 Text Interpretation:  Sinus rhythm Probable anteroseptal infarct, old Normal sinus rhythm Confirmed by Gerald Leitz ([id number]) on 12/30/2016 10:51:53 AM       Radiology Dg Chest 2 View  Result Date: 12/30/2016 CLINICAL DATA:  Full sensation in the chest with slight shortness of breath. Dizziness yesterday. EXAM: CHEST  2 VIEW COMPARISON:  None. FINDINGS: Normal sized heart. Bilateral nipple shadows. Clear lungs. The lungs are mildly hyperexpanded with mild diffuse peribronchial thickening and accentuation of the interstitial markings. Thoracic spine degenerative changes. IMPRESSION: No acute abnormality.  Mild changes of COPD and chronic bronchitis. Electronically Signed   By: Claudie Revering M.D.   On: 12/30/2016 12:05    Procedures Procedures (including critical care time)  Medications Ordered in ED Medications  gi cocktail (Maalox,Lidocaine,Donnatal) (30 mLs Oral Given 12/30/16 1234)     Initial Impression / Assessment and Plan / ED Course  I have reviewed the triage vital signs and the nursing notes.  Pertinent labs & imaging results that were available during my care of the patient were reviewed by me and considered in my medical decision making (see chart for details).     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 Patient had chest pain that started last night. Nonexertional did not radiate. There are no components of the tongue like ischemic chest pain except its location and pressure. Patient says is continued on and off till this morning. Patient had a normal echo and exam by Dr. Wynonia Lawman from cardiology one year ago.  We will initiate with troponin, x-ray, labs.  Heart score 3.  Discussed with the patient that we can't be 100% sure its not hisheart without admitting him. However patient would prefer to go home to take care of his dog. I think this is a reasonable  decision given that the pain got so much better with the GI cocktail. Additionally since the pain has been going on since yesterday so I would anticipate a positive troponin at that were indeed ischemic related. Patient also has had normal echo within the last year with Dr. Wynonia Lawman from cardiology and he has excellent follow-up. For all these reasons I believe it safe for patient to be discharged home and follow up with his primary care, cardiology.   Final Clinical Impressions(s) / ED Diagnoses   Final diagnoses:  Epigastric pain    New Prescriptions New Prescriptions   OMEPRAZOLE (PRILOSEC) 20 MG CAPSULE    Take 1 capsule (20 mg total) by mouth daily.     Kevin Space Julio Alm, MD 12/30/16 1506

## 2016-12-30 NOTE — Discharge Instructions (Signed)
We are unsure what caused her chest pain today. We discussed how it could be your heart but is less likely given the 2 negative tests. We do want you to start on omeprazole which will help reduce the amount of acid in her stomach produces. Please follow-up with Dr. Wynonia Lawman from cardiology. And return if you've any increase in shortness of breath or CP or other concerns.

## 2016-12-30 NOTE — ED Triage Notes (Signed)
Chest tightness that started yesterday and some nausea and a spell of dizziness yesterday , denies radiation

## 2017-05-06 DIAGNOSIS — N529 Male erectile dysfunction, unspecified: Secondary | ICD-10-CM | POA: Diagnosis not present

## 2017-05-06 DIAGNOSIS — C61 Malignant neoplasm of prostate: Secondary | ICD-10-CM | POA: Diagnosis not present

## 2017-05-06 DIAGNOSIS — Z8546 Personal history of malignant neoplasm of prostate: Secondary | ICD-10-CM | POA: Diagnosis not present

## 2017-07-05 DIAGNOSIS — C61 Malignant neoplasm of prostate: Secondary | ICD-10-CM | POA: Diagnosis not present

## 2017-07-05 DIAGNOSIS — N529 Male erectile dysfunction, unspecified: Secondary | ICD-10-CM | POA: Diagnosis not present

## 2017-07-29 DIAGNOSIS — Z23 Encounter for immunization: Secondary | ICD-10-CM | POA: Diagnosis not present

## 2017-07-29 DIAGNOSIS — Z862 Personal history of diseases of the blood and blood-forming organs and certain disorders involving the immune mechanism: Secondary | ICD-10-CM | POA: Diagnosis not present

## 2017-07-29 DIAGNOSIS — C61 Malignant neoplasm of prostate: Secondary | ICD-10-CM | POA: Diagnosis not present

## 2017-07-29 DIAGNOSIS — L989 Disorder of the skin and subcutaneous tissue, unspecified: Secondary | ICD-10-CM | POA: Diagnosis not present

## 2017-07-29 DIAGNOSIS — Z0000 Encounter for general adult medical examination without abnormal findings: Secondary | ICD-10-CM | POA: Diagnosis not present

## 2017-07-29 DIAGNOSIS — Z1389 Encounter for screening for other disorder: Secondary | ICD-10-CM | POA: Diagnosis not present

## 2017-07-29 DIAGNOSIS — M25551 Pain in right hip: Secondary | ICD-10-CM | POA: Diagnosis not present

## 2017-07-29 DIAGNOSIS — E7800 Pure hypercholesterolemia, unspecified: Secondary | ICD-10-CM | POA: Diagnosis not present

## 2017-07-29 DIAGNOSIS — N529 Male erectile dysfunction, unspecified: Secondary | ICD-10-CM | POA: Diagnosis not present

## 2017-09-06 DIAGNOSIS — L814 Other melanin hyperpigmentation: Secondary | ICD-10-CM | POA: Diagnosis not present

## 2017-09-06 DIAGNOSIS — L820 Inflamed seborrheic keratosis: Secondary | ICD-10-CM | POA: Diagnosis not present

## 2017-09-06 DIAGNOSIS — L821 Other seborrheic keratosis: Secondary | ICD-10-CM | POA: Diagnosis not present

## 2017-09-06 DIAGNOSIS — Z1283 Encounter for screening for malignant neoplasm of skin: Secondary | ICD-10-CM | POA: Diagnosis not present

## 2017-09-06 DIAGNOSIS — L818 Other specified disorders of pigmentation: Secondary | ICD-10-CM | POA: Diagnosis not present

## 2017-09-06 DIAGNOSIS — L218 Other seborrheic dermatitis: Secondary | ICD-10-CM | POA: Diagnosis not present

## 2017-11-29 DIAGNOSIS — N528 Other male erectile dysfunction: Secondary | ICD-10-CM | POA: Diagnosis not present

## 2017-11-29 DIAGNOSIS — Z8546 Personal history of malignant neoplasm of prostate: Secondary | ICD-10-CM | POA: Diagnosis not present

## 2017-11-29 DIAGNOSIS — C61 Malignant neoplasm of prostate: Secondary | ICD-10-CM | POA: Diagnosis not present

## 2017-12-11 DIAGNOSIS — M25512 Pain in left shoulder: Secondary | ICD-10-CM | POA: Diagnosis not present

## 2017-12-11 DIAGNOSIS — M25551 Pain in right hip: Secondary | ICD-10-CM | POA: Diagnosis not present

## 2017-12-11 DIAGNOSIS — M25552 Pain in left hip: Secondary | ICD-10-CM | POA: Diagnosis not present

## 2018-03-27 DIAGNOSIS — M5416 Radiculopathy, lumbar region: Secondary | ICD-10-CM | POA: Diagnosis not present

## 2018-03-27 DIAGNOSIS — M25512 Pain in left shoulder: Secondary | ICD-10-CM | POA: Diagnosis not present

## 2018-06-06 DIAGNOSIS — Z8546 Personal history of malignant neoplasm of prostate: Secondary | ICD-10-CM | POA: Diagnosis not present

## 2018-06-06 DIAGNOSIS — N528 Other male erectile dysfunction: Secondary | ICD-10-CM | POA: Diagnosis not present

## 2018-06-06 DIAGNOSIS — C61 Malignant neoplasm of prostate: Secondary | ICD-10-CM | POA: Diagnosis not present

## 2018-07-31 DIAGNOSIS — Z87891 Personal history of nicotine dependence: Secondary | ICD-10-CM | POA: Diagnosis not present

## 2018-07-31 DIAGNOSIS — C61 Malignant neoplasm of prostate: Secondary | ICD-10-CM | POA: Diagnosis not present

## 2018-07-31 DIAGNOSIS — Z23 Encounter for immunization: Secondary | ICD-10-CM | POA: Diagnosis not present

## 2018-07-31 DIAGNOSIS — E7800 Pure hypercholesterolemia, unspecified: Secondary | ICD-10-CM | POA: Diagnosis not present

## 2018-07-31 DIAGNOSIS — Z1211 Encounter for screening for malignant neoplasm of colon: Secondary | ICD-10-CM | POA: Diagnosis not present

## 2018-07-31 DIAGNOSIS — R030 Elevated blood-pressure reading, without diagnosis of hypertension: Secondary | ICD-10-CM | POA: Diagnosis not present

## 2018-07-31 DIAGNOSIS — Z1159 Encounter for screening for other viral diseases: Secondary | ICD-10-CM | POA: Diagnosis not present

## 2018-07-31 DIAGNOSIS — N529 Male erectile dysfunction, unspecified: Secondary | ICD-10-CM | POA: Diagnosis not present

## 2018-07-31 DIAGNOSIS — F339 Major depressive disorder, recurrent, unspecified: Secondary | ICD-10-CM | POA: Diagnosis not present

## 2018-07-31 DIAGNOSIS — Z0000 Encounter for general adult medical examination without abnormal findings: Secondary | ICD-10-CM | POA: Diagnosis not present

## 2018-08-04 ENCOUNTER — Other Ambulatory Visit: Payer: Self-pay | Admitting: Family Medicine

## 2018-08-04 DIAGNOSIS — Z87891 Personal history of nicotine dependence: Secondary | ICD-10-CM

## 2018-09-01 DIAGNOSIS — M67912 Unspecified disorder of synovium and tendon, left shoulder: Secondary | ICD-10-CM | POA: Diagnosis not present

## 2018-09-01 DIAGNOSIS — M67911 Unspecified disorder of synovium and tendon, right shoulder: Secondary | ICD-10-CM | POA: Diagnosis not present

## 2018-10-09 DIAGNOSIS — I10 Essential (primary) hypertension: Secondary | ICD-10-CM | POA: Diagnosis not present

## 2018-10-09 DIAGNOSIS — E7800 Pure hypercholesterolemia, unspecified: Secondary | ICD-10-CM | POA: Diagnosis not present

## 2018-10-09 DIAGNOSIS — J309 Allergic rhinitis, unspecified: Secondary | ICD-10-CM | POA: Diagnosis not present

## 2018-10-31 DIAGNOSIS — Z79899 Other long term (current) drug therapy: Secondary | ICD-10-CM | POA: Diagnosis not present

## 2018-10-31 DIAGNOSIS — I10 Essential (primary) hypertension: Secondary | ICD-10-CM | POA: Diagnosis not present

## 2018-10-31 DIAGNOSIS — E7800 Pure hypercholesterolemia, unspecified: Secondary | ICD-10-CM | POA: Diagnosis not present

## 2018-11-11 DIAGNOSIS — R5381 Other malaise: Secondary | ICD-10-CM | POA: Diagnosis not present

## 2018-11-11 DIAGNOSIS — R0789 Other chest pain: Secondary | ICD-10-CM | POA: Diagnosis not present

## 2018-11-11 DIAGNOSIS — I10 Essential (primary) hypertension: Secondary | ICD-10-CM | POA: Diagnosis not present

## 2018-12-05 DIAGNOSIS — Z8546 Personal history of malignant neoplasm of prostate: Secondary | ICD-10-CM | POA: Diagnosis not present

## 2018-12-05 DIAGNOSIS — C61 Malignant neoplasm of prostate: Secondary | ICD-10-CM | POA: Diagnosis not present

## 2018-12-05 DIAGNOSIS — N528 Other male erectile dysfunction: Secondary | ICD-10-CM | POA: Diagnosis not present

## 2019-06-01 DIAGNOSIS — N528 Other male erectile dysfunction: Secondary | ICD-10-CM | POA: Diagnosis not present

## 2019-06-01 DIAGNOSIS — Z8546 Personal history of malignant neoplasm of prostate: Secondary | ICD-10-CM | POA: Diagnosis not present

## 2019-06-01 DIAGNOSIS — C61 Malignant neoplasm of prostate: Secondary | ICD-10-CM | POA: Diagnosis not present

## 2019-07-15 DIAGNOSIS — M67911 Unspecified disorder of synovium and tendon, right shoulder: Secondary | ICD-10-CM | POA: Diagnosis not present

## 2019-07-15 DIAGNOSIS — M67919 Unspecified disorder of synovium and tendon, unspecified shoulder: Secondary | ICD-10-CM | POA: Diagnosis not present

## 2019-07-15 DIAGNOSIS — M67912 Unspecified disorder of synovium and tendon, left shoulder: Secondary | ICD-10-CM | POA: Diagnosis not present

## 2019-08-06 DIAGNOSIS — C61 Malignant neoplasm of prostate: Secondary | ICD-10-CM | POA: Diagnosis not present

## 2019-08-06 DIAGNOSIS — Z8589 Personal history of malignant neoplasm of other organs and systems: Secondary | ICD-10-CM | POA: Diagnosis not present

## 2019-08-06 DIAGNOSIS — Z0000 Encounter for general adult medical examination without abnormal findings: Secondary | ICD-10-CM | POA: Diagnosis not present

## 2019-08-06 DIAGNOSIS — E7800 Pure hypercholesterolemia, unspecified: Secondary | ICD-10-CM | POA: Diagnosis not present

## 2019-08-06 DIAGNOSIS — Z1211 Encounter for screening for malignant neoplasm of colon: Secondary | ICD-10-CM | POA: Diagnosis not present

## 2019-08-06 DIAGNOSIS — I10 Essential (primary) hypertension: Secondary | ICD-10-CM | POA: Diagnosis not present

## 2019-08-06 DIAGNOSIS — F339 Major depressive disorder, recurrent, unspecified: Secondary | ICD-10-CM | POA: Diagnosis not present

## 2019-08-06 DIAGNOSIS — K219 Gastro-esophageal reflux disease without esophagitis: Secondary | ICD-10-CM | POA: Diagnosis not present

## 2019-08-07 DIAGNOSIS — M545 Low back pain: Secondary | ICD-10-CM | POA: Diagnosis not present

## 2019-08-12 DIAGNOSIS — M47896 Other spondylosis, lumbar region: Secondary | ICD-10-CM | POA: Diagnosis not present

## 2019-08-12 DIAGNOSIS — M7541 Impingement syndrome of right shoulder: Secondary | ICD-10-CM | POA: Diagnosis not present

## 2019-08-12 DIAGNOSIS — M7542 Impingement syndrome of left shoulder: Secondary | ICD-10-CM | POA: Diagnosis not present

## 2019-09-03 DIAGNOSIS — M7541 Impingement syndrome of right shoulder: Secondary | ICD-10-CM | POA: Diagnosis not present

## 2019-09-03 DIAGNOSIS — M7542 Impingement syndrome of left shoulder: Secondary | ICD-10-CM | POA: Diagnosis not present

## 2019-09-03 DIAGNOSIS — M47896 Other spondylosis, lumbar region: Secondary | ICD-10-CM | POA: Diagnosis not present

## 2019-09-07 DIAGNOSIS — M7542 Impingement syndrome of left shoulder: Secondary | ICD-10-CM | POA: Diagnosis not present

## 2019-09-07 DIAGNOSIS — M47896 Other spondylosis, lumbar region: Secondary | ICD-10-CM | POA: Diagnosis not present

## 2019-09-07 DIAGNOSIS — M7541 Impingement syndrome of right shoulder: Secondary | ICD-10-CM | POA: Diagnosis not present

## 2019-09-09 DIAGNOSIS — M7542 Impingement syndrome of left shoulder: Secondary | ICD-10-CM | POA: Diagnosis not present

## 2019-09-09 DIAGNOSIS — M7541 Impingement syndrome of right shoulder: Secondary | ICD-10-CM | POA: Diagnosis not present

## 2019-09-09 DIAGNOSIS — M47896 Other spondylosis, lumbar region: Secondary | ICD-10-CM | POA: Diagnosis not present

## 2019-09-14 DIAGNOSIS — M7542 Impingement syndrome of left shoulder: Secondary | ICD-10-CM | POA: Diagnosis not present

## 2019-09-14 DIAGNOSIS — M7541 Impingement syndrome of right shoulder: Secondary | ICD-10-CM | POA: Diagnosis not present

## 2019-09-14 DIAGNOSIS — M47896 Other spondylosis, lumbar region: Secondary | ICD-10-CM | POA: Diagnosis not present

## 2019-09-18 DIAGNOSIS — M47896 Other spondylosis, lumbar region: Secondary | ICD-10-CM | POA: Diagnosis not present

## 2019-09-18 DIAGNOSIS — M7541 Impingement syndrome of right shoulder: Secondary | ICD-10-CM | POA: Diagnosis not present

## 2019-09-18 DIAGNOSIS — M7542 Impingement syndrome of left shoulder: Secondary | ICD-10-CM | POA: Diagnosis not present

## 2019-09-21 DIAGNOSIS — M47896 Other spondylosis, lumbar region: Secondary | ICD-10-CM | POA: Diagnosis not present

## 2019-09-21 DIAGNOSIS — M7542 Impingement syndrome of left shoulder: Secondary | ICD-10-CM | POA: Diagnosis not present

## 2019-09-21 DIAGNOSIS — M7541 Impingement syndrome of right shoulder: Secondary | ICD-10-CM | POA: Diagnosis not present

## 2019-09-23 DIAGNOSIS — M7542 Impingement syndrome of left shoulder: Secondary | ICD-10-CM | POA: Diagnosis not present

## 2019-09-23 DIAGNOSIS — M7541 Impingement syndrome of right shoulder: Secondary | ICD-10-CM | POA: Diagnosis not present

## 2019-09-23 DIAGNOSIS — M47896 Other spondylosis, lumbar region: Secondary | ICD-10-CM | POA: Diagnosis not present

## 2019-10-05 DIAGNOSIS — M47896 Other spondylosis, lumbar region: Secondary | ICD-10-CM | POA: Diagnosis not present

## 2019-10-05 DIAGNOSIS — M7541 Impingement syndrome of right shoulder: Secondary | ICD-10-CM | POA: Diagnosis not present

## 2019-10-05 DIAGNOSIS — M7542 Impingement syndrome of left shoulder: Secondary | ICD-10-CM | POA: Diagnosis not present

## 2019-10-08 DIAGNOSIS — H04123 Dry eye syndrome of bilateral lacrimal glands: Secondary | ICD-10-CM | POA: Diagnosis not present

## 2019-10-08 DIAGNOSIS — H2513 Age-related nuclear cataract, bilateral: Secondary | ICD-10-CM | POA: Diagnosis not present

## 2019-10-08 DIAGNOSIS — H43812 Vitreous degeneration, left eye: Secondary | ICD-10-CM | POA: Diagnosis not present

## 2019-10-08 DIAGNOSIS — H5010 Unspecified exotropia: Secondary | ICD-10-CM | POA: Diagnosis not present

## 2019-10-12 DIAGNOSIS — M47896 Other spondylosis, lumbar region: Secondary | ICD-10-CM | POA: Diagnosis not present

## 2019-10-12 DIAGNOSIS — M7541 Impingement syndrome of right shoulder: Secondary | ICD-10-CM | POA: Diagnosis not present

## 2019-10-12 DIAGNOSIS — M7542 Impingement syndrome of left shoulder: Secondary | ICD-10-CM | POA: Diagnosis not present

## 2019-10-29 DIAGNOSIS — Z03818 Encounter for observation for suspected exposure to other biological agents ruled out: Secondary | ICD-10-CM | POA: Diagnosis not present

## 2019-10-29 DIAGNOSIS — J069 Acute upper respiratory infection, unspecified: Secondary | ICD-10-CM | POA: Diagnosis not present

## 2019-10-29 DIAGNOSIS — R42 Dizziness and giddiness: Secondary | ICD-10-CM | POA: Diagnosis not present

## 2019-11-18 ENCOUNTER — Other Ambulatory Visit: Payer: Self-pay

## 2019-11-18 ENCOUNTER — Encounter (HOSPITAL_COMMUNITY): Payer: Self-pay

## 2019-11-18 ENCOUNTER — Emergency Department (HOSPITAL_COMMUNITY)
Admission: EM | Admit: 2019-11-18 | Discharge: 2019-11-18 | Disposition: A | Discharge status: Home / Self Care | Payer: Medicare HMO | Attending: Emergency Medicine | Admitting: Emergency Medicine

## 2019-11-18 DIAGNOSIS — R531 Weakness: Secondary | ICD-10-CM | POA: Insufficient documentation

## 2019-11-18 DIAGNOSIS — J3489 Other specified disorders of nose and nasal sinuses: Secondary | ICD-10-CM | POA: Diagnosis not present

## 2019-11-18 DIAGNOSIS — R110 Nausea: Secondary | ICD-10-CM | POA: Insufficient documentation

## 2019-11-18 DIAGNOSIS — R030 Elevated blood-pressure reading, without diagnosis of hypertension: Secondary | ICD-10-CM | POA: Insufficient documentation

## 2019-11-18 DIAGNOSIS — Z5321 Procedure and treatment not carried out due to patient leaving prior to being seen by health care provider: Secondary | ICD-10-CM | POA: Diagnosis not present

## 2019-11-18 DIAGNOSIS — R42 Dizziness and giddiness: Secondary | ICD-10-CM | POA: Insufficient documentation

## 2019-11-18 LAB — CBC
HCT: 45.2 % (ref 39.0–52.0)
Hemoglobin: 14.8 g/dL (ref 13.0–17.0)
MCH: 29.4 pg (ref 26.0–34.0)
MCHC: 32.7 g/dL (ref 30.0–36.0)
MCV: 89.7 fL (ref 80.0–100.0)
Platelets: 257 10*3/uL (ref 150–400)
RBC: 5.04 MIL/uL (ref 4.22–5.81)
RDW: 13.1 % (ref 11.5–15.5)
WBC: 9.8 10*3/uL (ref 4.0–10.5)
nRBC: 0 % (ref 0.0–0.2)

## 2019-11-18 LAB — BASIC METABOLIC PANEL
Anion gap: 11 (ref 5–15)
BUN: 23 mg/dL (ref 8–23)
CO2: 22 mmol/L (ref 22–32)
Calcium: 9.6 mg/dL (ref 8.9–10.3)
Chloride: 106 mmol/L (ref 98–111)
Creatinine, Ser: 1.38 mg/dL — ABNORMAL HIGH (ref 0.61–1.24)
GFR calc Af Amer: 58 mL/min — ABNORMAL LOW (ref >60)
GFR calc non Af Amer: 50 mL/min — ABNORMAL LOW (ref >60)
Glucose, Bld: 126 mg/dL — ABNORMAL HIGH (ref 70–99)
Potassium: 3.9 mmol/L (ref 3.5–5.1)
Sodium: 139 mmol/L (ref 135–145)

## 2019-11-18 MED ORDER — SODIUM CHLORIDE 0.9% FLUSH: 3.0000 mL | Freq: Once | INTRAVENOUS | Status: DC

## 2019-11-18 NOTE — ED Notes (Signed)
Pt called for vitals recheck no answer in lobby. Pt moved off the floor

## 2019-11-18 NOTE — ED Notes (Signed)
Called pt for urinalysis, no answer x2

## 2019-11-18 NOTE — ED Notes (Signed)
Pt name called for a updated set of vitals, no response

## 2019-11-18 NOTE — ED Notes (Addendum)
Pt called for vials recheck in lobby no answer

## 2019-11-18 NOTE — ED Triage Notes (Signed)
Pt presents w/dizzines, weakness and nausea x3-4 weeks, seen pcp had a covid swab which was negative. Today reports new onset of high blood pressure. SBP 180/200's . Pt also reports being "clogged up" and took alegra D with relieving sinus pressure

## 2019-11-18 NOTE — ED Notes (Signed)
Pt called for vitals recheck in lobby no answer

## 2019-11-20 DIAGNOSIS — R0981 Nasal congestion: Secondary | ICD-10-CM | POA: Diagnosis not present

## 2019-11-20 DIAGNOSIS — I10 Essential (primary) hypertension: Secondary | ICD-10-CM | POA: Diagnosis not present

## 2019-11-20 DIAGNOSIS — R42 Dizziness and giddiness: Secondary | ICD-10-CM | POA: Diagnosis not present

## 2019-11-23 DIAGNOSIS — R42 Dizziness and giddiness: Secondary | ICD-10-CM | POA: Diagnosis not present

## 2019-11-23 DIAGNOSIS — I10 Essential (primary) hypertension: Secondary | ICD-10-CM | POA: Diagnosis not present

## 2019-12-07 DIAGNOSIS — N529 Male erectile dysfunction, unspecified: Secondary | ICD-10-CM | POA: Diagnosis not present

## 2019-12-07 DIAGNOSIS — C61 Malignant neoplasm of prostate: Secondary | ICD-10-CM | POA: Diagnosis not present

## 2019-12-25 ENCOUNTER — Ambulatory Visit: Payer: Medicare HMO | Attending: Internal Medicine

## 2019-12-25 DIAGNOSIS — Z23 Encounter for immunization: Secondary | ICD-10-CM

## 2019-12-25 NOTE — Progress Notes (Signed)
   Covid-19 Vaccination Clinic  Name:  WREN MERGES    MRN: [id number] DOB: 1946/03/10  12/25/2019  Mr. Westmeyer was observed post Covid-19 immunization for 15 minutes without incident. He was provided with Vaccine Information Sheet and instruction to access the V-Safe system.   Mr. Zysk was instructed to call 911 with any severe reactions post vaccine: Marland Kitchen Difficulty breathing  . Swelling of face and throat  . A fast heartbeat  . A bad rash all over body  . Dizziness and weakness   Immunizations Administered    Name Date Dose VIS Date Route   Pfizer COVID-19 Vaccine 12/25/2019 11:31 AM 0.3 mL 09/11/2019 Intramuscular   Manufacturer: [place]   Lot: [id number]   [place]: [id number]

## 2020-01-18 ENCOUNTER — Ambulatory Visit: Payer: Medicare HMO | Attending: Internal Medicine

## 2020-01-18 DIAGNOSIS — Z23 Encounter for immunization: Secondary | ICD-10-CM

## 2020-01-18 NOTE — Progress Notes (Signed)
   Covid-19 Vaccination Clinic  Name:  Theodore Lamb    MRN: [id number] DOB: 05/03/1946  01/18/2020  [name] was observed post Covid-19 immunization for 15 minutes without incident. He was provided with Vaccine Information Sheet and instruction to access the V-Safe system.   [name] was instructed to call 911 with any severe reactions post vaccine: Marland Kitchen Difficulty breathing  . Swelling of face and throat  . A fast heartbeat  . A bad rash all over body  . Dizziness and weakness   Immunizations Administered    Name Date Dose VIS Date Route   Pfizer COVID-19 Vaccine 01/18/2020  3:28 PM 0.3 mL 11/25/2018 Intramuscular   Manufacturer: [place]   Lot: [id number]   [place]: [id number]

## 2020-05-23 DIAGNOSIS — R110 Nausea: Secondary | ICD-10-CM | POA: Diagnosis not present

## 2020-05-23 DIAGNOSIS — R0789 Other chest pain: Secondary | ICD-10-CM | POA: Diagnosis not present

## 2020-05-23 DIAGNOSIS — R42 Dizziness and giddiness: Secondary | ICD-10-CM | POA: Diagnosis not present

## 2020-05-23 DIAGNOSIS — R0602 Shortness of breath: Secondary | ICD-10-CM | POA: Diagnosis not present

## 2020-05-23 DIAGNOSIS — I10 Essential (primary) hypertension: Secondary | ICD-10-CM | POA: Diagnosis not present

## 2020-05-31 DIAGNOSIS — Z131 Encounter for screening for diabetes mellitus: Secondary | ICD-10-CM | POA: Diagnosis not present

## 2020-05-31 DIAGNOSIS — E7800 Pure hypercholesterolemia, unspecified: Secondary | ICD-10-CM | POA: Diagnosis not present

## 2020-05-31 DIAGNOSIS — Z87898 Personal history of other specified conditions: Secondary | ICD-10-CM | POA: Diagnosis not present

## 2020-05-31 DIAGNOSIS — R002 Palpitations: Secondary | ICD-10-CM | POA: Diagnosis not present

## 2020-06-07 ENCOUNTER — Other Ambulatory Visit: Payer: Self-pay

## 2020-06-07 ENCOUNTER — Encounter: Payer: Self-pay | Admitting: Cardiovascular Disease

## 2020-06-07 ENCOUNTER — Ambulatory Visit: Payer: Medicare HMO | Admitting: Cardiovascular Disease

## 2020-06-07 DIAGNOSIS — R079 Chest pain, unspecified: Secondary | ICD-10-CM

## 2020-06-07 MED ORDER — METOPROLOL TARTRATE 100 MG PO TABS: ORAL_TABLET | ORAL | 0 refills | Status: DC

## 2020-06-07 NOTE — Patient Instructions (Addendum)
Medication Instructions:  Your physician recommends that you continue on your current medications as directed. Please refer to the Current Medication list given to you today.  *If you need a refill on your cardiac medications before your next appointment, please call your pharmacy*   Lab Work: None Ordered If you have labs (blood work) drawn today and your tests are completely normal, you will receive your results only by: Marland Kitchen MyChart Message (if you have MyChart) OR . A paper copy in the mail If you have any lab test that is abnormal or we need to change your treatment, we will call you to review the results.   Testing/Procedures: Your physician has recommended that you wear an event monitor. Event monitors are medical devices that record the heart's electrical activity. Doctors most often [place] these monitors to diagnose arrhythmias. Arrhythmias are problems with the speed or rhythm of the heartbeat. The monitor is a small, portable device. You can wear one while you do your normal daily activities. This is usually used to diagnose what is causing palpitations/syncope (passing out).  [employer] Cardiac Event Monitor Instructions Your physician has requested you wear your cardiac event monitor for _____ days, (1-30). [employer] may call or text to confirm a shipping address. The monitor will be sent to a land address via UPS. [employer] will not ship a monitor to a PO BOX. It typically takes 3-5 days to receive your monitor after it has been enrolled. [employer] will assist with USPS tracking if your package is delayed. The telephone number for [employer] is [phone]. Once you have received your monitor, please review the enclosed instructions. Instruction tutorials can also be viewed under help and settings on the enclosed cell phone. Your monitor has already been registered assigning a specific monitor serial # to you.  Applying the monitor Remove cell phone from case and turn it on.  The cell phone works as Dealer and needs to be within [employer] of you at all times. The cell phone will need to be charged on a daily basis. We recommend you plug the cell phone into the enclosed charger at your bedside table every night.  Monitor batteries: You will receive two monitor batteries labelled #1 and #2. These are your recorders. Plug battery #2 onto the second connection on the enclosed charger. Keep one battery on the charger at all times. This will keep the monitor battery deactivated. It will also keep it fully charged for when you need to switch your monitor batteries. A small light will be blinking on the battery emblem when it is charging. The light on the battery emblem will remain on when the battery is fully charged.  Open package of a Monitor strip. Insert battery #1 into black hood on strip and gently squeeze monitor battery onto connection as indicated in instruction booklet. Set aside while preparing skin.  Choose location for your strip, vertical or horizontal, as indicated in the instruction booklet. Shave to remove all hair from location. There cannot be any lotions, oils, powders, or colognes on skin where monitor is to be applied. Wipe skin clean with enclosed Saline wipe. Dry skin completely.  Peel paper labeled #1 off the back of the Monitor strip exposing the adhesive. Place the monitor on the chest in the vertical or horizontal position shown in the instruction booklet. One arrow on the monitor strip must be pointing upward. Carefully remove paper labeled #2, attaching remainder of strip to your skin. Try not to create any folds or  wrinkles in the strip as you apply it.  Firmly press and release the circle in the center of the monitor battery. You will hear a small beep. This is turning the monitor battery on. The heart emblem on the monitor battery will light up every 5 seconds if the monitor battery in turned on and connected to the patient  securely. Do not push and hold the circle down as this turns the monitor battery off. The cell phone will locate the monitor battery. A screen will appear on the cell phone checking the connection of your monitor strip. This may read poor connection initially but change to good connection within the next minute. Once your monitor accepts the connection you will hear a series of 3 beeps followed by a climbing crescendo of beeps. A screen will appear on the cell phone showing the two monitor strip placement options. Touch the picture that demonstrates where you applied the monitor strip.  Your monitor strip and battery are waterproof. You are able to shower, bathe, or swim with the monitor on. They just ask you do not submerge deeper than 3 feet underwater. We recommend removing the monitor if you are swimming in a lake, river, or ocean.  Your monitor battery will need to be switched to a fully charged monitor battery approximately once a week. The cell phone will alert you of an action which needs to be made.  On the cell phone, tap for details to reveal connection status, monitor battery status, and cell phone battery status. The green dots indicates your monitor is in good status. A red dot indicates there is something that needs your attention.  To record a symptom, click the circle on the monitor battery. In 30-60 seconds a list of symptoms will appear on the cell phone. Select your symptom and tap save. Your monitor will record a sustained or significant arrhythmia regardless of you clicking the button. Some patients do not feel the heart rhythm irregularities. [employer] will notify us of any serious or critical events.  Refer to instruction booklet for instructions on switching batteries, changing strips, the Do not disturb or Pause features, or any additional questions.  Call [employer] at [phone], to confirm your monitor is transmitting and record your baseline. They  will answer any questions you may have regarding the monitor instructions at that time.  Returning the monitor to [employer] Place all equipment back into blue box. Peel off strip of paper to expose adhesive and close box securely. There is a prepaid UPS shipping label on this box. Drop in a UPS drop box, or at a UPS facility like [employer]. You may also contact [employer] to arrange UPS to pick up monitor package at your home.   Your cardiac CT will be scheduled at one of the below locations:   [facility] [address] [place], [place] [address] [phone]  OR  [facility] [address] [place], [place] [address] [phone]  If scheduled at [facility], please arrive at the [facility] main entrance of [facility] 30 minutes prior to test start time. Proceed to the [facility] Radiology Department (first floor) to check-in and test prep.  If scheduled at [facility], please arrive 15 mins early for check-in and test prep.  Please follow these instructions carefully (unless otherwise directed):  Hold all erectile dysfunction medications at least 3 days (72 hrs) prior to test.  On the Night Before the  Test: . Be sure to Drink plenty of water. . Do not consume any caffeinated/decaffeinated beverages or chocolate 12 hours prior to your test. . Do not take any antihistamines 12 hours prior to your test. . If the patient has contrast allergy: ? Patient will need a prescription for Prednisone and very clear instructions (as follows): 1. Prednisone 50 mg - take 13 hours prior to test 2. Take another Prednisone 50 mg 7 hours prior to test 3. Take another Prednisone 50 mg 1 hour prior to test 4. Take Benadryl 50 mg 1 hour prior to test . Patient must complete all four doses of above prophylactic medications. . Patient will need a ride after test due to Benadryl.  On the Day of  the Test: . Drink plenty of water. Do not drink any water within one hour of the test. . Do not eat any food 4 hours prior to the test. . You may take your regular medications prior to the test.  . Take metoprolol (Lopressor) two hours prior to test.        After the Test: . Drink plenty of water. . After receiving IV contrast, you may experience a mild flushed feeling. This is normal. . On occasion, you may experience a mild rash up to 24 hours after the test. This is not dangerous. If this occurs, you can take Benadryl 25 mg and increase your fluid intake. . If you experience trouble breathing, this can be serious. If it is severe call 911 IMMEDIATELY. If it is mild, please call our office. . If you take any of these medications: Glipizide/Metformin, Avandament, Glucavance, please do not take 48 hours after completing test unless otherwise instructed.   Once we have confirmed authorization from your insurance company, we will call you to set up a date and time for your test. Based on how quickly your insurance processes prior authorizations requests, please allow up to 4 weeks to be contacted for scheduling your Cardiac CT appointment. Be advised that routine Cardiac CT appointments could be scheduled as many as 8 weeks after your provider has ordered it.  For non-scheduling related questions, please contact the cardiac imaging nurse navigator should you have any questions/concerns: Marchia Bond, Cardiac Imaging Nurse Navigator Burley Saver, Interim Cardiac Imaging Nurse [place] [facility] Direct Office Dial: [phone]   For scheduling needs, including cancellations and rescheduling, please call Vivien Rota at [phone], option 3.     Follow-Up: At [facility], you and your health needs are our priority.  As part of our continuing mission to provide you with exceptional heart care, we have created designated Provider Care Teams.  These Care Teams include  your primary Cardiologist (physician) and Advanced Practice Providers (APPs -  Physician Assistants and Nurse Practitioners) who all work together to provide you with the care you need, when you need it.   Your next appointment:   6 month(s) on Friday March 11 at 11:20 am  The format for your next appointment:   In Person  Provider:   You may see Mertie Moores, MD or one of the following Advanced Practice Providers on your designated Care Team:    Richardson Dopp, PA-C  [place], [place]

## 2020-06-07 NOTE — Progress Notes (Signed)
Cardiology Office Note:    Date:  06/07/2020   ID:  Theodore Lamb, DOB 13-May-1946, MRN [id number]  PCP:  Leighton Ruff, MD  [facility] Cardiologist:  Camden Mazzaferro  [facility] Electrophysiologist:  None   Referring MD: Leighton Ruff, MD   Chief Complaint  Patient presents with  . Palpitations     Sept. 7, 2021   Theodore Lamb is a 74 y.o. male with a hx of  Palpitations and blurred vision. We were asked to see him today by Dr. Drema Dallas for further evaluation of his palpitations.   Has been having unpredictable spells for the past 3-4 weeks.  Occurs spontaneously,   Not preceded by anything in particular  May be associated with eating  - several have occurred 30 min after eating .  Works around his house.   Does not exercise per se.   Does not walk much  Is limited by back pain .  The spells will last 15-30 min.  Has mild episodes that start with mild mid sternal pressure,  A flutter sensation, mild heart burn,  And the sensation that he needs to take more frequent breaths . Som bad episodes cause some vision changes, nausea , hands and feet numbness.  Assoc with tightness in his throat .   Checks his Omron BP cuff and his VS are normal .   Eats and drinks regularly .  Not associated with syncope or presyncope  The symptoms dissapate over 1-2 minutes and then he feels well .   Has been seen by Dr. Wynonia Lawman in the past  Has worn a monitor and did an echo  Was told his echo was fine .   Past Medical History:  Diagnosis Date  . 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 Cervical spondylosis   . Depression   . FH: alcoholism   . H/O leukocytosis    leukocytosis and erythrocytosis  . Prostate cancer ([place])   . S/P radiation therapy 02/23/2015 through 04/21/2015   Prostate 7800 cGy in 40 sessions, seminal vesicles, and pelvic lymph nodes 5600 cGy in 40 sessions   . 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 LESION REMOVAL  2007   floor of mouth  . TONSILLECTOMY AND ADENOIDECTOMY      Current Medications: Current Meds  Medication Sig  . Glucosamine-Chondroitin (GLUCOSAMINE CHONDR COMPLEX PO) Take 1 tablet by mouth daily in the afternoon.  Marland Kitchen ibuprofen (ADVIL) 200 MG tablet Take 1 tablet by mouth every 6 (six) hours as needed.  Marland Kitchen losartan (COZAAR) 100 MG tablet Take 1 tablet by mouth daily in the afternoon.  . Multiple Vitamin (MULTIVITAMIN) tablet Take 1 tablet by mouth daily.  . Niacin (VITAMIN B-3 PO) Take 1 tablet by mouth daily in the afternoon.  . Zinc 50 MG TABS Take 1 tablet by mouth daily in the afternoon.     Allergies:   Patient has no known allergies.   Social History   Socioeconomic History  . Marital status: Divorced    Spouse name: Not on file  . Number of children: Not on file  . Years of education: Not on file  . Highest education level: Not on file  Occupational History  . Not on file  Tobacco Use  . Smoking status: Current Every Day Smoker  . Smokeless tobacco: Current User  . Tobacco comment: frequency  1 PPD .  Smoking : smoking electric cigaretes . Alcohol : no     Substance and Sexual Activity  . Alcohol use: No    Alcohol/week: 0.0 standard drinks  . Drug use: No  . Sexual activity: Not on file  Other Topics Concern  . Not on file  Social History Narrative   History of smoking cigarettes :Current smoker,   Frequency : 1 PPD Smoking: smoking electric  cigaretes. Alcohol: no.   Social Determinants of Health   Financial Resource Strain:   . Difficulty of Paying Living Expenses: Not on file  Food Insecurity:   . Worried About Charity fundraiser in the Last Year: Not on file  . Ran Out of Food in the Last Year: Not on file  Transportation Needs:   . Lack of Transportation (Medical): Not on file  . Lack of Transportation (Non-Medical): Not on file  Physical Activity:   . Days of Exercise per  Week: Not on file  . Minutes of Exercise per Session: Not on file  Stress:   . Feeling of Stress : Not on file  Social Connections:   . Frequency of Communication with Friends and Family: Not on file  . Frequency of Social Gatherings with Friends and Family: Not on file  . Attends Religious Services: Not on file  . Active Member of Clubs or Organizations: Not on file  . Attends Archivist Meetings: Not on file  . Marital Status: Not on file     Family History: The patient's family history includes Colon cancer in an other family member; Testicular cancer in his paternal uncle.  ROS:   Please see the history of present illness.     All other systems reviewed and are negative.  EKGs/Labs/Other Studies Reviewed:    The following studies were reviewed today:   EKG:   From Dr. Drema Dallas office - Aug. 23, 2021:  NSR at 74.  No ST or T wave changes.   Recent Labs: 11/18/2019: BUN 23; Creatinine, Ser 1.38; Hemoglobin 14.8; Platelets 257; Potassium 3.9; Sodium 139  Recent Lipid Panel No results found for: CHOL, TRIG, HDL, CHOLHDL, VLDL, LDLCALC, LDLDIRECT  Physical Exam:    VS:  BP 122/66   Pulse 72   Ht $R'5\' 8"'Gf$  (1.727 m)   Wt 242 lb 12.8 oz (110.1 kg)   SpO2 94%   BMI 36.92 kg/m     Wt Readings from Last 3 Encounters:  06/07/20 242 lb 12.8 oz (110.1 kg)  05/25/15 223 lb 9.6 oz (101.4 kg)  04/18/15 237 lb 6.4 oz (107.7 kg)     GEN:  Middle age man, moderately obese  HEENT: Normal NECK: No JVD; No carotid bruits LYMPHATICS: No lymphadenopathy CARDIAC: RRR, no murmurs, rubs, gallops RESPIRATORY:  Clear to auscultation without rales, wheezing or rhonchi  ABDOMEN: Soft, non-tender, non-distended MUSCULOSKELETAL:  No edema; No deformity  SKIN: Warm and dry NEUROLOGIC:  Alert and oriented x 3 PSYCHIATRIC:  Normal affect   ASSESSMENT:    1. Chest pain, unspecified type    PLAN:    In order of problems listed above:  1. Palpitation:   Pt has been having  palpitations for the past several weeks.   He thinks some of these are associated with eating ( has been eating very poorly until this past week.  Was eating sausage buiscuits and other fast foods constantly  accioated with CP , indigestion   These episodes might be due to indigestion/gastroesophageal reflux disease.  I do think  he has some risk factors for coronary artery disease including hypertension and hyperlipidemia. I would like to place an event monitor for further evaluation of his palpitations.  I would also like to perform a coronary CT angiogram for further evaluation of his episodes of chest pain.  I will see him again in 6 months for follow-up visit.   Medication Adjustments/Labs and Tests Ordered: Current medicines are reviewed at length with the patient today.  Concerns regarding medicines are outlined above.  Orders Placed This Encounter  Procedures  . CT CORONARY MORPH W/CTA COR W/SCORE W/CA W/CM &/OR WO/CM  . CT CORONARY FRACTIONAL FLOW RESERVE DATA PREP  . CT CORONARY FRACTIONAL FLOW RESERVE FLUID ANALYSIS  . Cardiac event monitor   Meds ordered this encounter  Medications  . metoprolol tartrate (LOPRESSOR) 100 MG tablet    Sig: Take 1 tablet (100 mg) 2 hours before your CT    Dispense:  1 tablet    Refill:  0    Patient Instructions  Medication Instructions:  Your physician recommends that you continue on your current medications as directed. Please refer to the Current Medication list given to you today.  *If you need a refill on your cardiac medications before your next appointment, please call your pharmacy*   Lab Work: None Ordered If you have labs (blood work) drawn today and your tests are completely normal, you will receive your results only by: Marland Kitchen MyChart Message (if you have MyChart) OR . A paper copy in the mail If you have any lab test that is abnormal or we need to change your treatment, we will call you to review the  results.   Testing/Procedures: Your physician has recommended that you wear an event monitor. Event monitors are medical devices that record the heart's electrical activity. Doctors most often [place] these monitors to diagnose arrhythmias. Arrhythmias are problems with the speed or rhythm of the heartbeat. The monitor is a small, portable device. You can wear one while you do your normal daily activities. This is usually used to diagnose what is causing palpitations/syncope (passing out).  [employer] Cardiac Event Monitor Instructions Your physician has requested you wear your cardiac event monitor for _____ days, (1-30). [employer] may call or text to confirm a shipping address. The monitor will be sent to a land address via UPS. [employer] will not ship a monitor to a PO BOX. It typically takes 3-5 days to receive your monitor after it has been enrolled. [employer] will assist with USPS tracking if your package is delayed. The telephone number for [employer] is [phone]. Once you have received your monitor, please review the enclosed instructions. Instruction tutorials can also be viewed under help and settings on the enclosed cell phone. Your monitor has already been registered assigning a specific monitor serial # to you.  Applying the monitor Remove cell phone from case and turn it on. The cell phone works as Dealer and needs to be within [employer] of you at all times. The cell phone will need to be charged on a daily basis. We recommend you plug the cell phone into the enclosed charger at your bedside table every night.  Monitor batteries: You will receive two monitor batteries labelled #1 and #2. These are your recorders. Plug battery #2 onto the second connection on the enclosed charger. Keep one battery on the charger at all times. This will keep the monitor battery deactivated. It will also keep it fully charged for when you need to switch  your monitor batteries. A small  light will be blinking on the battery emblem when it is charging. The light on the battery emblem will remain on when the battery is fully charged.  Open package of a Monitor strip. Insert battery #1 into black hood on strip and gently squeeze monitor battery onto connection as indicated in instruction booklet. Set aside while preparing skin.  Choose location for your strip, vertical or horizontal, as indicated in the instruction booklet. Shave to remove all hair from location. There cannot be any lotions, oils, powders, or colognes on skin where monitor is to be applied. Wipe skin clean with enclosed Saline wipe. Dry skin completely.  Peel paper labeled #1 off the back of the Monitor strip exposing the adhesive. Place the monitor on the chest in the vertical or horizontal position shown in the instruction booklet. One arrow on the monitor strip must be pointing upward. Carefully remove paper labeled #2, attaching remainder of strip to your skin. Try not to create any folds or wrinkles in the strip as you apply it.  Firmly press and release the circle in the center of the monitor battery. You will hear a small beep. This is turning the monitor battery on. The heart emblem on the monitor battery will light up every 5 seconds if the monitor battery in turned on and connected to the patient securely. Do not push and hold the circle down as this turns the monitor battery off. The cell phone will locate the monitor battery. A screen will appear on the cell phone checking the connection of your monitor strip. This may read poor connection initially but change to good connection within the next minute. Once your monitor accepts the connection you will hear a series of 3 beeps followed by a climbing crescendo of beeps. A screen will appear on the cell phone showing the two monitor strip placement options. Touch the picture that demonstrates where you applied the monitor strip.  Your monitor strip and  battery are waterproof. You are able to shower, bathe, or swim with the monitor on. They just ask you do not submerge deeper than 3 feet underwater. We recommend removing the monitor if you are swimming in a lake, river, or ocean.  Your monitor battery will need to be switched to a fully charged monitor battery approximately once a week. The cell phone will alert you of an action which needs to be made.  On the cell phone, tap for details to reveal connection status, monitor battery status, and cell phone battery status. The green dots indicates your monitor is in good status. A red dot indicates there is something that needs your attention.  To record a symptom, click the circle on the monitor battery. In 30-60 seconds a list of symptoms will appear on the cell phone. Select your symptom and tap save. Your monitor will record a sustained or significant arrhythmia regardless of you clicking the button. Some patients do not feel the heart rhythm irregularities. [employer] will notify us of any serious or critical events.  Refer to instruction booklet for instructions on switching batteries, changing strips, the Do not disturb or Pause features, or any additional questions.  Call [employer] at [phone], to confirm your monitor is transmitting and record your baseline. They will answer any questions you may have regarding the monitor instructions at that time.  Returning the monitor to [place] all equipment back into blue box. Peel off strip of paper to expose adhesive and close box  securely. There is a prepaid UPS shipping label on this box. Drop in a UPS drop box, or at a UPS facility like [employer]. You may also contact [employer] to arrange UPS to pick up monitor package at your home.   Your cardiac CT will be scheduled at one of the below locations:   [facility] [address] [place], [place] [address] [phone]  [facility] [address] [place], [place] [address] [phone]  If scheduled at [facility], please arrive at the [facility] main entrance of [facility] 30 minutes prior to test start time. Proceed to the [facility] Radiology Department (first floor) to check-in and test prep.  If scheduled at [facility], please arrive 15 mins early for check-in and test prep.  Please follow these instructions carefully (unless otherwise directed):  Hold all erectile dysfunction medications at least 3 days (72 hrs) prior to test.  On the Night Before the Test: . Be sure to Drink plenty of water. . Do not consume any caffeinated/decaffeinated beverages or chocolate 12 hours prior to your test. . Do not take any antihistamines 12 hours prior to your test. . If the patient has contrast allergy: ? Patient will need a prescription for Prednisone and very clear instructions (as follows): 1. Prednisone 50 mg - take 13 hours prior to test 2. Take another Prednisone 50 mg 7 hours prior to test 3. Take another Prednisone 50 mg 1 hour prior to test 4. Take Benadryl 50 mg 1 hour prior to test . Patient must complete all four doses of above prophylactic medications. . Patient will need a ride after test due to Benadryl.  On the Day of the Test: . Drink plenty of water. Do not drink any water within one hour of the test. . Do not eat any food 4 hours prior to the test. . You may take your regular medications prior to the test.  . Take metoprolol (Lopressor) two hours prior to test.        After the Test: . Drink plenty of water. . After receiving IV contrast, you may experience a mild flushed feeling. This is normal. . On occasion, you may experience a mild rash up to 24 hours after the test. This is not dangerous. If this occurs, you can take Benadryl 25 mg and increase your fluid intake. . If you experience trouble breathing, this can be  serious. If it is severe call 911 IMMEDIATELY. If it is mild, please call our office. . If you take any of these medications: Glipizide/Metformin, Avandament, Glucavance, please do not take 48 hours after completing test unless otherwise instructed.   Once we have confirmed authorization from your insurance company, we will call you to set up a date and time for your test. Based on how quickly your insurance processes prior authorizations requests, please allow up to 4 weeks to be contacted for scheduling your Cardiac CT appointment. Be advised that routine Cardiac CT appointments could be scheduled as many as 8 weeks after your provider has ordered it.  For non-scheduling related questions, please contact the cardiac imaging nurse navigator should you have any questions/concerns: Marchia Bond, Cardiac Imaging Nurse Navigator Burley Saver, Interim Cardiac Imaging Nurse [place] [facility] Direct Office Dial: [phone]   For scheduling needs, including cancellations and rescheduling, please call Vivien Rota at [phone], option 3.     Follow-Up: At [facility]  HeartCare, you and your health needs are our priority.  As part of our continuing mission to provide you with exceptional heart care, we have created designated Provider Care Teams.  These Care Teams include your primary Cardiologist (physician) and Advanced Practice Providers (APPs -  Physician Assistants and Nurse Practitioners) who all work together to provide you with the care you need, when you need it.   Your next appointment:   6 month(s) on Friday March 11 at 11:20 am  The format for your next appointment:   In Person  Provider:   You may see Mertie Moores, MD or one of the following Advanced Practice Providers on your designated Care Team:    Richardson Dopp, PA-C  Robbie Lis, [place]        Signed, Mertie Moores, MD  06/07/2020 3:29 PM    [place]

## 2020-06-08 ENCOUNTER — Telehealth: Payer: Self-pay | Admitting: Radiology

## 2020-06-08 NOTE — Telephone Encounter (Signed)
Enrolled patient for a 30 day [employer] Event Monitor to be mailed to patients home  

## 2020-06-13 DIAGNOSIS — N529 Male erectile dysfunction, unspecified: Secondary | ICD-10-CM | POA: Diagnosis not present

## 2020-06-13 DIAGNOSIS — C61 Malignant neoplasm of prostate: Secondary | ICD-10-CM | POA: Diagnosis not present

## 2020-06-20 ENCOUNTER — Encounter (INDEPENDENT_AMBULATORY_CARE_PROVIDER_SITE_OTHER): Payer: Medicare HMO

## 2020-06-20 DIAGNOSIS — R079 Chest pain, unspecified: Secondary | ICD-10-CM | POA: Diagnosis not present

## 2020-06-20 DIAGNOSIS — R002 Palpitations: Secondary | ICD-10-CM | POA: Diagnosis not present

## 2020-07-12 ENCOUNTER — Emergency Department (HOSPITAL_COMMUNITY): Payer: Medicare HMO

## 2020-07-12 ENCOUNTER — Other Ambulatory Visit: Payer: Self-pay

## 2020-07-12 ENCOUNTER — Emergency Department (HOSPITAL_COMMUNITY)
Admission: EM | Admit: 2020-07-12 | Discharge: 2020-07-12 | Disposition: A | Discharge status: Home / Self Care | Payer: Medicare HMO | Attending: Emergency Medicine | Admitting: Emergency Medicine

## 2020-07-12 ENCOUNTER — Encounter (HOSPITAL_COMMUNITY): Payer: Self-pay

## 2020-07-12 DIAGNOSIS — R42 Dizziness and giddiness: Secondary | ICD-10-CM | POA: Diagnosis not present

## 2020-07-12 DIAGNOSIS — R072 Precordial pain: Secondary | ICD-10-CM | POA: Diagnosis not present

## 2020-07-12 DIAGNOSIS — Z85818 Personal history of malignant neoplasm of other sites of lip, oral cavity, and pharynx: Secondary | ICD-10-CM | POA: Diagnosis not present

## 2020-07-12 DIAGNOSIS — R0789 Other chest pain: Secondary | ICD-10-CM | POA: Diagnosis not present

## 2020-07-12 DIAGNOSIS — Z8546 Personal history of malignant neoplasm of prostate: Secondary | ICD-10-CM | POA: Diagnosis not present

## 2020-07-12 DIAGNOSIS — R079 Chest pain, unspecified: Secondary | ICD-10-CM | POA: Diagnosis not present

## 2020-07-12 DIAGNOSIS — F17210 Nicotine dependence, cigarettes, uncomplicated: Secondary | ICD-10-CM | POA: Diagnosis not present

## 2020-07-12 DIAGNOSIS — R0602 Shortness of breath: Secondary | ICD-10-CM | POA: Insufficient documentation

## 2020-07-12 DIAGNOSIS — Z7982 Long term (current) use of aspirin: Secondary | ICD-10-CM | POA: Diagnosis not present

## 2020-07-12 DIAGNOSIS — I10 Essential (primary) hypertension: Secondary | ICD-10-CM | POA: Diagnosis not present

## 2020-07-12 DIAGNOSIS — R202 Paresthesia of skin: Secondary | ICD-10-CM | POA: Insufficient documentation

## 2020-07-12 DIAGNOSIS — R002 Palpitations: Secondary | ICD-10-CM | POA: Diagnosis present

## 2020-07-12 LAB — CBC
HCT: 45.5 % (ref 39.0–52.0)
Hemoglobin: 14.8 g/dL (ref 13.0–17.0)
MCH: 29.3 pg (ref 26.0–34.0)
MCHC: 32.5 g/dL (ref 30.0–36.0)
MCV: 90.1 fL (ref 80.0–100.0)
Platelets: 239 10*3/uL (ref 150–400)
RBC: 5.05 MIL/uL (ref 4.22–5.81)
RDW: 13 % (ref 11.5–15.5)
WBC: 8.8 10*3/uL (ref 4.0–10.5)
nRBC: 0 % (ref 0.0–0.2)

## 2020-07-12 LAB — TROPONIN I (HIGH SENSITIVITY)
Troponin I (High Sensitivity): 7 ng/L (ref <18)
Troponin I (High Sensitivity): 6 ng/L (ref <18)
Troponin I (High Sensitivity): 8 ng/L (ref <18)

## 2020-07-12 LAB — BASIC METABOLIC PANEL
Anion gap: 8 (ref 5–15)
BUN: 17 mg/dL (ref 8–23)
CO2: 27 mmol/L (ref 22–32)
Calcium: 9.8 mg/dL (ref 8.9–10.3)
Chloride: 103 mmol/L (ref 98–111)
Creatinine, Ser: 1.33 mg/dL — ABNORMAL HIGH (ref 0.61–1.24)
GFR, Estimated: 52 mL/min — ABNORMAL LOW (ref >60)
Glucose, Bld: 100 mg/dL — ABNORMAL HIGH (ref 70–99)
Potassium: 4.1 mmol/L (ref 3.5–5.1)
Sodium: 138 mmol/L (ref 135–145)

## 2020-07-12 NOTE — Discharge Instructions (Addendum)
Work-up here today without any acute findings.  Call Dr. Elmarie Shiley office to see when they are planning to do the CT of your coronary arteries.  Return for any new or worse symptoms.

## 2020-07-12 NOTE — ED Notes (Signed)
Pt arrived back to room alert and oriented does not appear in distress. Pt denies pain at this time Respirations are even and non-labored, skin is warm, dry and intact.  Pt states when he does have "episodes" they last between 15-20 minutes.  States he has been seen by a cardiologist and wore a monitor because he has been having chest pains of a while now, yesterday and today were different because they were accompanied by dizziness as well.  Pt denies any activity that seems to trigger episodes   changed into gown and placed on cardiac monitor, call light within reach

## 2020-07-12 NOTE — ED Provider Notes (Addendum)
[facility] EMERGENCY DEPARTMENT Provider Note   CSN: [id number] Arrival date & time: 07/12/20  1118     History Chief Complaint  Patient presents with  . Chest Pain    Theodore Lamb is a 74 y.o. male.  Patient being followed lasting anywhere from 15 to 20 minutes where he has palpitations has some chest  pain gets dizzy but never does pass out.  No specific trigger episodes.  Patient had an episode today that between 10 or 1030 lasted about 30 minutes.  None since.  Feels fine now.  This 1 had chest pressure in the center of the chest some dizziness and shortness of breath and had a little bit of bilateral facial numbness.  No weakness no vision changes.  No headache.        Past Medical History:  Diagnosis Date  . 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 Cervical spondylosis   . Depression   . FH: alcoholism   . H/O leukocytosis    leukocytosis and erythrocytosis  . Prostate cancer ([place])   . S/P radiation therapy 02/23/2015 through 04/21/2015   Prostate 7800 cGy in 40 sessions, seminal vesicles, and pelvic lymph nodes 5600 cGy in 40 sessions   . 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 Prostate cancer ([place]) 11/23/2014    Past Surgical History:  Procedure Laterality Date  . LESION REMOVAL  2007   floor of mouth  . TONSILLECTOMY AND ADENOIDECTOMY         Family History  Problem Relation Age of Onset  . Testicular cancer Paternal Uncle   . Colon cancer Other     Social History   Tobacco Use  . Smoking status: Current Every Day Smoker  . Smokeless tobacco: Current User  . Tobacco comment: frequency  1 PPD . Smoking : smoking electric cigaretes . Alcohol : no     Substance Use Topics  . Alcohol use: No    Alcohol/week: 0.0 standard drinks  . Drug use: No    Home Medications Prior to Admission medications   Medication Sig  Start Date End Date Taking? Authorizing Provider  aspirin EC 81 MG tablet Take 81 mg by mouth daily. Swallow whole.   Yes [provider]  Cholecalciferol (VITAMIN D3) 10 MCG (400 UNIT) tablet Take 400 Units by mouth daily.   Yes [provider]  Glucosamine-Chondroitin (GLUCOSAMINE CHONDR COMPLEX PO) Take 1 tablet by mouth daily in the afternoon.   Yes [provider]  losartan (COZAAR) 100 MG tablet Take 1 tablet by mouth daily in the afternoon. 05/20/20  Yes [provider]  Multiple Vitamin (MULTIVITAMIN) tablet Take 1 tablet by mouth daily.   Yes [provider]  Niacin (VITAMIN B-3 PO) Take 1 tablet by mouth daily in the afternoon.   Yes [provider]  Zinc 50 MG TABS Take 1 tablet by mouth daily in the afternoon.   Yes [provider]  metoprolol tartrate (LOPRESSOR) 100 MG tablet Take 1 tablet (100 mg) 2 hours before your CT Patient not taking: Reported on 07/12/2020 06/07/20   Nahser, Wonda Cheng, MD    Allergies    Patient has no known allergies.  Review of Systems   Review of Systems  Constitutional: Negative for chills and fever.  HENT: Negative for congestion, rhinorrhea and sore throat.   Eyes: Negative  for visual disturbance.  Respiratory: Positive for shortness of breath. Negative for cough.   Cardiovascular: Positive for chest pain and palpitations. Negative for leg swelling.  Gastrointestinal: Negative for abdominal pain, diarrhea, nausea and vomiting.  Genitourinary: Negative for dysuria.  Musculoskeletal: Negative for back pain and neck pain.  Skin: Negative for rash.  Neurological: Negative for dizziness, light-headedness and headaches.  Hematological: Does not bruise/bleed easily.  Psychiatric/Behavioral: Negative for confusion.    Physical Exam Updated Vital Signs BP (!) 150/81   Pulse (!) 52   Temp 98.6 F (37 C) (Oral)   Resp 19   Ht 1.727 m (5\' 8" )   Wt 106.6 kg   SpO2 93%   BMI 35.73 kg/m    Physical Exam Vitals and nursing note reviewed.  Constitutional:      General: He is not in acute distress.    Appearance: Normal appearance. He is well-developed.  HENT:     Head: Normocephalic and atraumatic.  Eyes:     Extraocular Movements: Extraocular movements intact.     Conjunctiva/sclera: Conjunctivae normal.     Pupils: Pupils are equal, round, and reactive to light.  Cardiovascular:     Rate and Rhythm: Normal rate and regular rhythm.     Heart sounds: No murmur heard.   Pulmonary:     Effort: Pulmonary effort is normal. No respiratory distress.     Breath sounds: Normal breath sounds.  Abdominal:     Palpations: Abdomen is soft.     Tenderness: There is no abdominal tenderness.  Musculoskeletal:        General: Normal range of motion.     Cervical back: Normal range of motion and neck supple.  Skin:    General: Skin is warm and dry.     Capillary Refill: Capillary refill takes less than 2 seconds.  Neurological:     General: No focal deficit present.     Mental Status: He is alert and oriented to person, place, and time.     Cranial Nerves: No cranial nerve deficit.     Sensory: No sensory deficit.     ED Results / Procedures / Treatments   Labs (all labs ordered are listed, but only abnormal results are displayed) Labs Reviewed  BASIC METABOLIC PANEL - Abnormal; Notable for the following components:      Result Value   Glucose, Bld 100 (*)    Creatinine, Ser 1.33 (*)    GFR, Estimated 52 (*)    All other components within normal limits  CBC  TROPONIN I (HIGH SENSITIVITY)  TROPONIN I (HIGH SENSITIVITY)  TROPONIN I (HIGH SENSITIVITY)  TROPONIN I (HIGH SENSITIVITY)    EKG EKG Interpretation  Date/Time:  Tuesday July 12 2020 11:18:42 EDT Ventricular Rate:  66 PR Interval:  176 QRS Duration: 90 QT Interval:  410 QTC Calculation: 429 R Axis:   20 Text Interpretation: Normal sinus rhythm Normal ECG Confirmed by Fredia Sorrow [phone]) on  07/12/2020 4:30:06 PM   Radiology DG Chest 2 View  Result Date: 07/12/2020 CLINICAL DATA:  Chest pain EXAM: CHEST - 2 VIEW COMPARISON:  2018 FINDINGS: Slightly increased interstitial prominence since 2018. No pleural effusion or pneumothorax. Cardiomediastinal contours are within normal limits. No acute osseous abnormality. IMPRESSION: Slightly increased interstitial prominence since 2018. May reflect progression of chronic changes or minor superimposed edema. Electronically Signed   By: Macy Mis M.D.   On: 07/12/2020 11:59    Procedures Procedures (including critical care time)  Medications Ordered in  ED Medications - No data to display  ED Course  I have reviewed the triage vital signs and the nursing notes.  Pertinent labs & imaging results that were available during my care of the patient were reviewed by me and considered in my medical decision making (see chart for details).    MDM Rules/Calculators/A&P                          Work-up here cardiac enzymes x3 without any acute changes.  Patient being followed by cardiology for this symptom complex its been going on for a period of time.  Has a CT angio of the coronary arteries planned.  Recommend patient follow back up with cardiology to see when they are planning to do that.  X-ray without any acute findings.  Labs are any significant abnormalities.  EKG without any acute changes.  Patient stable for discharge home.  Patient had one single episode at about 10 or 1030 this morning that lasted about 30 minutes and has had none since.  Stated cardiology is trying to figure out the cause of these episodes.  Symptoms seem to be extremely atypical for something that would be neurologic.  Recommend further follow-up to completely rule out cardiac etiology.   Final Clinical Impression(s) / ED Diagnoses Final diagnoses:  Precordial pain    Rx / DC Orders ED Discharge Orders    None       Fredia Sorrow, MD 07/12/20  1631    Fredia Sorrow, MD 07/12/20 1904

## 2020-07-12 NOTE — ED Triage Notes (Signed)
To triage via EMS from home.  Onset `10:30a chest pressure center of chest, dizziness, shortness of breath, and bilateral face numbness.  Pt currently wearing heart monitor for same symptoms that have been occurring for the past month   Denies dizziness, shortness of breath, and bilateral face numbness.   EMS BP 192/92 HR 90 RR 22 SpO2 99%  CBG 168

## 2020-07-12 NOTE — ED Notes (Signed)
Patient verbalizes understanding of discharge instructions. Opportunity for questioning and answers were provided. Armband removed by staff, pt discharged from ED ambulatory to home.  

## 2020-07-18 ENCOUNTER — Telehealth (HOSPITAL_COMMUNITY): Payer: Self-pay | Admitting: Emergency Medicine

## 2020-07-18 NOTE — Telephone Encounter (Signed)
Attempted to call patient regarding upcoming cardiac CT appointment. °Left message on voicemail with name and callback number °Ezme Duch RN Navigator Cardiac Imaging °[facility] °[phone] Office °[phone] Cell ° °

## 2020-07-19 ENCOUNTER — Ambulatory Visit (HOSPITAL_COMMUNITY)
Admission: RE | Admit: 2020-07-19 | Discharge: 2020-07-19 | Disposition: A | Discharge status: Home / Self Care | Payer: Medicare HMO | Source: Ambulatory Visit | Attending: Cardiovascular Disease | Admitting: Cardiovascular Disease

## 2020-07-19 ENCOUNTER — Encounter (HOSPITAL_COMMUNITY): Payer: Self-pay

## 2020-07-19 DIAGNOSIS — I700 Atherosclerosis of aorta: Secondary | ICD-10-CM | POA: Insufficient documentation

## 2020-07-19 DIAGNOSIS — R079 Chest pain, unspecified: Secondary | ICD-10-CM | POA: Diagnosis not present

## 2020-07-19 DIAGNOSIS — K449 Diaphragmatic hernia without obstruction or gangrene: Secondary | ICD-10-CM | POA: Diagnosis not present

## 2020-07-19 DIAGNOSIS — I2510 Atherosclerotic heart disease of native coronary artery without angina pectoris: Secondary | ICD-10-CM | POA: Diagnosis not present

## 2020-07-19 DIAGNOSIS — N62 Hypertrophy of breast: Secondary | ICD-10-CM | POA: Insufficient documentation

## 2020-07-19 MED ORDER — IOHEXOL 350 MG/ML SOLN
80.0000 mL | Freq: Once | INTRAVENOUS | Status: AC | PRN
  Administered 2020-07-19: 80 mL via INTRAVENOUS

## 2020-07-19 MED ORDER — NITROGLYCERIN 0.4 MG SL SUBL: 0.8000 mg | SUBLINGUAL_TABLET | Freq: Once | SUBLINGUAL | Status: AC

## 2020-07-19 MED ORDER — NITROGLYCERIN 0.4 MG SL SUBL
SUBLINGUAL_TABLET | SUBLINGUAL | Status: AC
  Administered 2020-07-19: 0.8 mg via SUBLINGUAL
  Filled 2020-07-19: qty 2

## 2020-07-20 ENCOUNTER — Ambulatory Visit (HOSPITAL_COMMUNITY)
Admission: RE | Admit: 2020-07-20 | Discharge: 2020-07-20 | Disposition: A | Discharge status: Home / Self Care | Payer: Medicare HMO | Source: Ambulatory Visit | Attending: Cardiovascular Disease | Admitting: Cardiovascular Disease

## 2020-07-20 DIAGNOSIS — R079 Chest pain, unspecified: Secondary | ICD-10-CM

## 2020-07-21 DIAGNOSIS — I2510 Atherosclerotic heart disease of native coronary artery without angina pectoris: Secondary | ICD-10-CM | POA: Diagnosis not present

## 2020-07-26 ENCOUNTER — Telehealth: Payer: Self-pay | Admitting: Cardiovascular Disease

## 2020-07-26 NOTE — Telephone Encounter (Signed)
Theodore Lamb, Theodore Cheng, MD  P Cv Div Ch St Triage Coronary calcium score: The patient's coronary artery calcium score  is 438, which places the patient in the 46 percentile   Mild - moderate CAD.  Unlikely to be causing his symptoms.  I suspect that his symptoms are due to GI etiology - indigestion  He does have some CAD and he should aim for an LDL of 70 or lower  His last LDL was 191.  See if he will start rosuvastatin 20 mg a day  Check lipids, liver enz, bmp in 3 months .    Left message for patient to call back.

## 2020-07-26 NOTE — Telephone Encounter (Signed)
Pt calling in regards to his lab results. He saw the MyChart results but wanted to discuss the recommendation to start medication. He wanted to know if he could just see if he could wait to see how his  diet changes could affect his cholesterol. He eats a mainly vegetarian diet but has occasional [place] or chicken. He feels much better eating this way. He would like to avoid taking medication as long as possible

## 2020-07-28 ENCOUNTER — Telehealth: Payer: Self-pay | Admitting: Cardiovascular Disease

## 2020-07-28 ENCOUNTER — Other Ambulatory Visit: Payer: Self-pay | Admitting: Cardiovascular Disease

## 2020-07-28 DIAGNOSIS — R002 Palpitations: Secondary | ICD-10-CM

## 2020-07-28 DIAGNOSIS — I2510 Atherosclerotic heart disease of native coronary artery without angina pectoris: Secondary | ICD-10-CM

## 2020-07-28 DIAGNOSIS — R079 Chest pain, unspecified: Secondary | ICD-10-CM

## 2020-07-28 DIAGNOSIS — E785 Hyperlipidemia, unspecified: Secondary | ICD-10-CM

## 2020-07-28 MED ORDER — ROSUVASTATIN CALCIUM 5 MG PO TABS: 5.0000 mg | ORAL_TABLET | Freq: Every day | ORAL | 3 refills | Status: DC

## 2020-07-28 NOTE — Telephone Encounter (Signed)
Patient is returning call for results. 

## 2020-07-28 NOTE — Telephone Encounter (Signed)
Patient aware. See telephone encounter dated 10/28 for full details.

## 2020-07-28 NOTE — Telephone Encounter (Signed)
Cardiac monitor results reviewed with patient who verbalized understanding. Questions answered to his satisfaction.  Reviewed coronary CT results and plan of care with patient. He reports he has drastically changed his diet to reduce fat and calories. I advised that per Dr. Acie Fredrickson, the patient's diet improvements are encouraging, but it will not likely get patient's LDL to goal of <70. Patient agrees to start rosuvastatin 5 mg daily and will get repeat lab work on 1/27. I advised him to call back prior to lab appointment with questions or concerns. He verbalized understanding and agreement with plan and thanked me for the call.

## 2020-10-11 DIAGNOSIS — H2513 Age-related nuclear cataract, bilateral: Secondary | ICD-10-CM | POA: Diagnosis not present

## 2020-10-11 DIAGNOSIS — H5010 Unspecified exotropia: Secondary | ICD-10-CM | POA: Diagnosis not present

## 2020-10-11 DIAGNOSIS — D3131 Benign neoplasm of right choroid: Secondary | ICD-10-CM | POA: Diagnosis not present

## 2020-10-11 DIAGNOSIS — H43813 Vitreous degeneration, bilateral: Secondary | ICD-10-CM | POA: Diagnosis not present

## 2020-10-11 DIAGNOSIS — H04123 Dry eye syndrome of bilateral lacrimal glands: Secondary | ICD-10-CM | POA: Diagnosis not present

## 2020-10-18 ENCOUNTER — Encounter (INDEPENDENT_AMBULATORY_CARE_PROVIDER_SITE_OTHER): Payer: Medicare HMO | Admitting: Ophthalmology

## 2020-10-27 ENCOUNTER — Other Ambulatory Visit: Payer: Medicare HMO

## 2020-10-27 ENCOUNTER — Other Ambulatory Visit: Payer: Self-pay

## 2020-10-27 DIAGNOSIS — E785 Hyperlipidemia, unspecified: Secondary | ICD-10-CM | POA: Diagnosis not present

## 2020-10-27 DIAGNOSIS — R079 Chest pain, unspecified: Secondary | ICD-10-CM

## 2020-10-27 DIAGNOSIS — I2510 Atherosclerotic heart disease of native coronary artery without angina pectoris: Secondary | ICD-10-CM | POA: Diagnosis not present

## 2020-10-27 LAB — BASIC METABOLIC PANEL
BUN/Creatinine Ratio: 14 (ref 10–24)
BUN: 17 mg/dL (ref 8–27)
CO2: 23 mmol/L (ref 20–29)
Calcium: 9.2 mg/dL (ref 8.6–10.2)
Chloride: 106 mmol/L (ref 96–106)
Creatinine, Ser: 1.2 mg/dL (ref 0.76–1.27)
GFR calc Af Amer: 68 mL/min/{1.73_m2} (ref >59)
GFR calc non Af Amer: 59 mL/min/{1.73_m2} — ABNORMAL LOW (ref >59)
Glucose: 107 mg/dL — ABNORMAL HIGH (ref 65–99)
Potassium: 4.4 mmol/L (ref 3.5–5.2)
Sodium: 141 mmol/L (ref 134–144)

## 2020-10-27 LAB — LIPID PANEL
Chol/HDL Ratio: 5.1 ratio — ABNORMAL HIGH (ref 0.0–5.0)
Cholesterol, Total: 194 mg/dL (ref 100–199)
HDL: 38 mg/dL — ABNORMAL LOW (ref >39)
LDL Chol Calc (NIH): 115 mg/dL — ABNORMAL HIGH (ref 0–99)
Triglycerides: 235 mg/dL — ABNORMAL HIGH (ref 0–149)
VLDL Cholesterol Cal: 41 mg/dL — ABNORMAL HIGH (ref 5–40)

## 2020-10-27 LAB — HEPATIC FUNCTION PANEL
ALT: 19 IU/L (ref 0–44)
AST: 16 IU/L (ref 0–40)
Albumin: 3.7 g/dL (ref 3.7–4.7)
Alkaline Phosphatase: 57 IU/L (ref 44–121)
Bilirubin Total: 0.2 mg/dL (ref 0.0–1.2)
Bilirubin, Direct: <0.1 mg/dL (ref 0.00–0.40)
Total Protein: 6.2 g/dL (ref 6.0–8.5)

## 2020-10-28 ENCOUNTER — Telehealth: Payer: Self-pay | Admitting: *Deleted

## 2020-10-28 DIAGNOSIS — E785 Hyperlipidemia, unspecified: Secondary | ICD-10-CM

## 2020-10-28 MED ORDER — ROSUVASTATIN CALCIUM 20 MG PO TABS: 20.0000 mg | ORAL_TABLET | Freq: Every day | ORAL | 3 refills | Status: DC

## 2020-10-28 NOTE — Telephone Encounter (Signed)
Patient notified.  Prescription sent to [facility].  Patient will come in for fasting lab work on April 28,2022

## 2020-10-28 NOTE — Telephone Encounter (Signed)
-----   Message from Thayer Headings, MD sent at 10/27/2020  5:47 PM EST ----- Has moderate CAD by coronary CT angio.   Please increase rosuvastatin to 20 mg a day .  He needs to be very careful about his diet.  Recheck lipids. Liver enz, bmp in  3 months

## 2020-10-31 ENCOUNTER — Other Ambulatory Visit: Payer: Self-pay

## 2020-10-31 ENCOUNTER — Encounter (INDEPENDENT_AMBULATORY_CARE_PROVIDER_SITE_OTHER): Payer: Self-pay | Admitting: Ophthalmology

## 2020-10-31 ENCOUNTER — Ambulatory Visit (INDEPENDENT_AMBULATORY_CARE_PROVIDER_SITE_OTHER): Payer: Medicare HMO | Admitting: Ophthalmology

## 2020-10-31 DIAGNOSIS — H2513 Age-related nuclear cataract, bilateral: Secondary | ICD-10-CM | POA: Insufficient documentation

## 2020-10-31 DIAGNOSIS — H43813 Vitreous degeneration, bilateral: Secondary | ICD-10-CM | POA: Diagnosis not present

## 2020-10-31 DIAGNOSIS — D3131 Benign neoplasm of right choroid: Secondary | ICD-10-CM | POA: Diagnosis not present

## 2020-10-31 HISTORY — DX: Age-related nuclear cataract, bilateral: H25.13

## 2020-10-31 NOTE — Assessment & Plan Note (Signed)
The nature of choroidal nevus was discussed with the patient and an informational form was offered.  Photo documentation was discussed with the patient.  Periodic follow-up may be needed for a lifetime. The patient's questions were answered. At minimum, annual exams will be needed if no signs of early growth. 

## 2020-10-31 NOTE — Assessment & Plan Note (Signed)
The nature of cataract was discussed with the patient as well as the elective nature of surgery. The patient was reassured that surgery at a later date does not put the patient at risk for a worse outcome. It was emphasized that the need for surgery is dictated by the patient's quality of life as influenced by the cataract. Patient was instructed to maintain close follow up with their general eye care doctor.  Minor OU observe

## 2020-10-31 NOTE — Progress Notes (Signed)
10/31/2020     CHIEF COMPLAINT Patient presents for Retina Evaluation (NP-Nevus OD, ref'd by R. Groat///Pt reports blurry vision a few months ago OU, flashes that come and go OU (pt was told possible ocular migraine). Pt denies any pain or pressure. )   HISTORY OF PRESENT ILLNESS: Theodore Lamb is a 75 y.o. male who presents to the clinic today for:   HPI    Retina Evaluation    In right eye.  This started 2 weeks ago.  Duration of 2. Additional comments: NP-Nevus OD, ref'd by R. Groat   Pt reports blurry vision a few months ago OU, flashes that come and go OU (pt was told possible ocular migraine). Pt denies any pain or pressure.        Last edited by Nichola Sizer D on 10/31/2020  9:35 AM. (History)      Referring physician: Leighton Ruff, MD [place],  [place] [id number]  HISTORICAL INFORMATION:   Selected notes from the MEDICAL RECORD NUMBER       CURRENT MEDICATIONS: No current outpatient medications on file. (Ophthalmic Drugs)   No current facility-administered medications for this visit. (Ophthalmic Drugs)   Current Outpatient Medications (Other)  Medication Sig  . aspirin EC 81 MG tablet Take 81 mg by mouth daily. Swallow whole.  . Cholecalciferol (VITAMIN D3) 10 MCG (400 UNIT) tablet Take 400 Units by mouth daily.  . Glucosamine-Chondroitin (GLUCOSAMINE CHONDR COMPLEX PO) Take 1 tablet by mouth daily in the afternoon.  Marland Kitchen losartan (COZAAR) 100 MG tablet Take 1 tablet by mouth daily in the afternoon.  . metoprolol tartrate (LOPRESSOR) 100 MG tablet Take 1 tablet (100 mg) 2 hours before your CT (Patient not taking: Reported on 07/12/2020)  . Multiple Vitamin (MULTIVITAMIN) tablet Take 1 tablet by mouth daily.  . Niacin (VITAMIN B-3 PO) Take 1 tablet by mouth daily in the afternoon.  . rosuvastatin (CRESTOR) 20 MG tablet Take 1 tablet (20 mg total) by mouth daily.  . Zinc 50 MG TABS Take 1 tablet by mouth daily in the afternoon.   No  current facility-administered medications for this visit. (Other)      REVIEW OF SYSTEMS:    ALLERGIES No Known Allergies  PAST MEDICAL HISTORY Past Medical History:  Diagnosis Date  . 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 Cervical spondylosis   . Depression   . FH: alcoholism   . H/O leukocytosis    leukocytosis and erythrocytosis  . Prostate cancer ([place])   . S/P radiation therapy 02/23/2015 through 04/21/2015   Prostate 7800 cGy in 40 sessions, seminal vesicles, and pelvic lymph nodes 5600 cGy in 40 sessions   . 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 LESION REMOVAL  2007   floor of mouth  . TONSILLECTOMY AND ADENOIDECTOMY      FAMILY HISTORY Family History  Problem Relation Age of Onset  . Testicular cancer Paternal Uncle   . Colon cancer Other     SOCIAL HISTORY Social History   Tobacco Use  . Smoking status: Current Every Day Smoker  . Smokeless tobacco: Current User  . Tobacco comment: frequency  1 PPD . Smoking : smoking electric cigaretes . Alcohol : no     Substance Use Topics  . Alcohol use: No    Alcohol/week: 0.0 standard drinks  . Drug use: No  OPHTHALMIC EXAM: Base Eye Exam    Visual Acuity (ETDRS)      Right Left   Dist cc 20/40 20/20 -1   Dist ph cc 20/30 -2    Correction: Glasses       Tonometry (Tonopen, 9:42 AM)      Right Left   Pressure 14 12       Pupils      Pupils Dark Light Shape React APD   Right PERRL 3 2 Round Brisk None   Left PERRL 3 2 Round Brisk None       Visual Fields (Counting fingers)      Left Right    Full Full       Extraocular Movement      Right Left    Full Full       Neuro/Psych    Oriented x3: Yes       Dilation    Both eyes: 1.0% Mydriacyl, 2.5% Phenylephrine @ 9:42 AM        Slit Lamp and Fundus Exam    External Exam      Right Left   External Normal Normal        Slit Lamp Exam      Right Left   Lids/Lashes Normal Normal   Conjunctiva/Sclera White and quiet White and quiet   Cornea Clear Clear   Anterior Chamber Deep and quiet Deep and quiet   Iris Round and reactive Round and reactive   Lens 2+ Nuclear sclerosis 2+ Nuclear sclerosis   Anterior Vitreous Normal Normal       Fundus Exam      Right Left   Posterior Vitreous Posterior vitreous detachment Posterior vitreous detachment   Disc Normal Normal   C/D Ratio 0.25 0.25   Macula Normal Normal   Vessels Normal Normal   Periphery Flat nevus inferonasal to nerve 3.0x1.75DD, no atrophy, no fluid Normal          IMAGING AND PROCEDURES  Imaging and Procedures for 10/31/20  OCT, Retina - OU - Both Eyes       Right Eye Quality was good. Scan locations included subfoveal. Central Foveal Thickness: 269. Progression has no prior data. Findings include normal foveal contour.   Left Eye Quality was good. Scan locations included subfoveal. Central Foveal Thickness: 264. Progression has no prior data. Findings include normal foveal contour.   Notes Incidental posterior vitreous detachment noted OU       Color Fundus Photography Optos - OU - Both Eyes       Right Eye Progression has been stable. Disc findings include normal observations. Macula : normal observations. Vessels : normal observations.   Left Eye Progression has been stable. Disc findings include normal observations. Macula : normal observations. Vessels : normal observations. Periphery : normal observations.   Notes Choroidal nevus inferonasal to nerve, flat low risk features.  No lipofuscin, no thickening, regular pigmentation, no atrophy, no vascularization.       B-Scan Ultrasound - OD - Right Eye       Quality was good. Findings included vitreous opacities, posterior vitreous detachment.   Notes No signs of choroidal elevation on examination today                ASSESSMENT/PLAN:  Nuclear  sclerotic cataract of both eyes The nature of cataract was discussed with the patient as well as the elective nature of surgery. The patient was reassured that surgery at a later date does not put the  patient at risk for a worse outcome. It was emphasized that the need for surgery is dictated by the patient's quality of life as influenced by the cataract. Patient was instructed to maintain close follow up with their general eye care doctor.  Minor OU observe  Choroidal nevus, right The nature of choroidal nevus was discussed with the patient and an informational form was offered.  Photo documentation was discussed with the patient.  Periodic follow-up may be needed for a lifetime. The patient's questions were answered. At minimum, annual exams will be needed if no signs of early growth.  Posterior vitreous detachment of both eyes   The nature of posterior vitreous detachment was discussed with the patient as well as its physiology, its age prevalence, and its possible implication regarding retinal breaks and detachment.  An informational brochure was given to the patient.  All the patient's questions were answered.  The patient was asked to return if new or different flashes or floaters develops.   Patient was instructed to contact office immediately if any changes were noticed. I explained to the patient that vitreous inside the eye is similar to jello inside a bowl. As the jello melts it can start to pull away from the bowl, similarly the vitreous throughout our lives can begin to pull away from the retina. That process is called a posterior vitreous detachment. In some cases, the vitreous can tug hard enough on the retina to form a retinal tear. I discussed with the patient the signs and symptoms of a retinal detachment.  Do not rub the eye.      ICD-10-CM   1. Choroidal nevus, right  D31.31 OCT, Retina - OU - Both Eyes    Color Fundus Photography Optos - OU - Both Eyes    B-Scan Ultrasound - OD -  Right Eye  2. Nuclear sclerotic cataract of both eyes  H25.13   3. Posterior vitreous detachment of both eyes  H43.813 OCT, Retina - OU - Both Eyes    1.  Patient evaluated today for choroidal nevus of the right eye found by [facility] eye care.  There are no high risk features.  Size is small.  Color fundus photography to document as well as verification by B-scan and a skin ultrasonography confirm the absence of detectable thickness to this lesion.  2.  Initial follow-up will be 6 months to confirm no significant growth or change in configuration.  3.  I explained to patient this is a very low risk lesion.  Follow-up with Dr. Carolynn Sayers and [facility] eye care regarding general eye care needs and following cataracts  Ophthalmic Meds Ordered this visit:  No orders of the defined types were placed in this encounter.      Return in about 6 months (around 04/30/2021) for COLOR FP, DILATE OU.  There are no Patient Instructions on file for this visit.   Explained the diagnoses, plan, and follow up with the patient and they expressed understanding.  Patient expressed understanding of the importance of proper follow up care.   Clent Demark Crews Mccollam M.D. Diseases & Surgery of the Retina and Vitreous [facility] [place] 10/31/20     Abbreviations: M myopia (nearsighted); A astigmatism; H hyperopia (farsighted); P presbyopia; Mrx spectacle prescription;  CTL contact lenses; OD right eye; OS left eye; OU both eyes  XT exotropia; ET esotropia; PEK punctate epithelial keratitis; PEE punctate epithelial erosions; DES dry eye syndrome; MGD meibomian gland dysfunction; ATs artificial tears; PFAT's preservative free  artificial tears; [place] nuclear sclerotic cataract; PSC posterior subcapsular cataract; ERM epi-retinal membrane; PVD posterior vitreous detachment; RD retinal detachment; DM diabetes mellitus; DR diabetic retinopathy; NPDR non-proliferative diabetic retinopathy; PDR proliferative diabetic  retinopathy; CSME clinically significant macular edema; DME diabetic macular edema; dbh dot blot hemorrhages; CWS cotton wool spot; POAG primary open angle glaucoma; C/D cup-to-disc ratio; HVF humphrey visual field; GVF goldmann visual field; OCT optical coherence tomography; IOP intraocular pressure; BRVO Branch retinal vein occlusion; CRVO central retinal vein occlusion; CRAO central retinal artery occlusion; BRAO branch retinal artery occlusion; RT retinal tear; SB scleral buckle; PPV pars plana vitrectomy; VH Vitreous hemorrhage; PRP panretinal laser photocoagulation; IVK intravitreal kenalog; VMT vitreomacular traction; MH Macular hole;  NVD neovascularization of the disc; NVE neovascularization elsewhere; AREDS age related eye disease study; ARMD age related macular degeneration; POAG primary open angle glaucoma; EBMD epithelial/anterior basement membrane dystrophy; ACIOL anterior chamber intraocular lens; IOL intraocular lens; PCIOL posterior chamber intraocular lens; Phaco/IOL phacoemulsification with intraocular lens placement; [place] photorefractive keratectomy; LASIK laser assisted in situ keratomileusis; HTN hypertension; DM diabetes mellitus; COPD chronic obstructive pulmonary disease

## 2020-10-31 NOTE — Assessment & Plan Note (Signed)

## 2020-11-01 DIAGNOSIS — I10 Essential (primary) hypertension: Secondary | ICD-10-CM | POA: Diagnosis not present

## 2020-11-01 DIAGNOSIS — R7303 Prediabetes: Secondary | ICD-10-CM | POA: Diagnosis not present

## 2020-11-01 DIAGNOSIS — Z8546 Personal history of malignant neoplasm of prostate: Secondary | ICD-10-CM | POA: Diagnosis not present

## 2020-11-01 DIAGNOSIS — N644 Mastodynia: Secondary | ICD-10-CM | POA: Diagnosis not present

## 2020-11-03 ENCOUNTER — Other Ambulatory Visit: Payer: Self-pay | Admitting: Family Medicine

## 2020-11-03 DIAGNOSIS — N644 Mastodynia: Secondary | ICD-10-CM

## 2020-12-08 ENCOUNTER — Encounter: Payer: Self-pay | Admitting: Cardiovascular Disease

## 2020-12-08 NOTE — Progress Notes (Signed)
Cardiology Office Note:    Date:  12/09/2020   ID:  Theodore Lamb, DOB Aug 17, 1946, MRN [id number]  PCP:  Theodore Cruel, MD  [facility] Cardiologist:  Theodore Lamb  [facility] Electrophysiologist:  None   Referring MD: Theodore Ruff, MD   Chief Complaint  Patient presents with  . Chest Pain  . Hyperlipidemia     Sept. 7, 2021   Theodore Lamb is a 75 y.o. male with a hx of  Palpitations and blurred vision. We were asked to see him today by Dr. Drema Lamb for further evaluation of his palpitations.   Has been having unpredictable spells for the past 3-4 weeks.  Occurs spontaneously,   Not preceded by anything in particular  May be associated with eating  - several have occurred 30 min after eating .  Works around his house.   Does not exercise per se.   Does not walk much  Is limited by back pain .  The spells will last 15-30 min.  Has mild episodes that start with mild mid sternal pressure,  A flutter sensation, mild heart burn,  And the sensation that he needs to take more frequent breaths . Som bad episodes cause some vision changes, nausea , hands and feet numbness.  Assoc with tightness in his throat .   Checks his Omron BP cuff and his VS are normal .   Eats and drinks regularly .  Not associated with syncope or presyncope  The symptoms dissapate over 1-2 minutes and then he feels well .   Has been seen by Dr. Wynonia Lamb in the past  Has worn a monitor and did an echo  Was told his echo was fine .   December 09, 2020:   Theodore Lamb is seen today for follow up of his palpitations,  Moderate CAD , HLD   CCTA  in Oct. 2021 reveals : Coronary calcium score: The patient's coronary artery calcium score is 438, which places the patient in the 46 percentile. 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     Past Medical History:  Diagnosis Date  . 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 Cervical spondylosis   . Depression   . FH: alcoholism   . H/O leukocytosis    leukocytosis and erythrocytosis  . Prostate cancer ([place])   . S/P radiation therapy 02/23/2015 through 04/21/2015   Prostate 7800 cGy in 40 sessions, seminal vesicles, and pelvic lymph nodes 5600 cGy in 40 sessions   . 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 LESION REMOVAL  2007   floor of mouth  . TONSILLECTOMY AND ADENOIDECTOMY      Current Medications: Current Meds  Medication Sig  . aspirin EC 81 MG tablet Take 81 mg by mouth daily. Swallow whole.  Marland Kitchen buPROPion (WELLBUTRIN XL) 150 MG 24 hr tablet Take 1 tablet by mouth daily.  . Cholecalciferol (VITAMIN D3) 10 MCG (400 UNIT) tablet Take 400 Units by mouth daily.  . Glucosamine-Chondroitin (GLUCOSAMINE CHONDR COMPLEX PO) Take 1 tablet by mouth daily in  the afternoon.  Marland Kitchen losartan (COZAAR) 100 MG tablet Take 1 tablet by mouth daily in the afternoon.  . Multiple Vitamin (MULTIVITAMIN) tablet Take 1 tablet by mouth daily.  . Niacin (VITAMIN B-3 PO) Take 1 tablet by mouth daily in the afternoon.  . rosuvastatin (CRESTOR) 20 MG tablet Take 1 tablet (20 mg total) by mouth daily.  . Zinc 50 MG TABS Take 1 tablet by mouth daily in the afternoon.  . [DISCONTINUED] metoprolol tartrate (LOPRESSOR) 100 MG tablet Take 1 tablet (100 mg) 2 hours before your CT     Allergies:   Patient has no known allergies.   Social History   Socioeconomic History  . Marital status: Divorced    Spouse name: Not on file  . Number of children: Not on file  . Years of education: Not on file  . Highest education level: Not on file  Occupational History  . Not  on file  Tobacco Use  . Smoking status: Current Every Day Smoker  . Smokeless tobacco: Current User  . Tobacco comment: frequency  1 PPD . Smoking : smoking electric cigaretes . Alcohol : no     Substance and Sexual Activity  . Alcohol use: No    Alcohol/week: 0.0 standard drinks  . Drug use: No  . Sexual activity: Not on file  Other Topics Concern  . Not on file  Social History Narrative   History of smoking cigarettes :Current smoker,   Frequency : 1 PPD Smoking: smoking electric  cigaretes. Alcohol: no.   Social Determinants of Health   Financial Resource Strain: Not on file  Food Insecurity: Not on file  Transportation Needs: Not on file  Physical Activity: Not on file  Stress: Not on file  Social Connections: Not on file     Family History: The patient's family history includes Colon cancer in an other family member; Testicular cancer in his paternal uncle.  ROS:   Please see the history of present illness.     All other systems reviewed and are negative.  EKGs/Labs/Other Studies Reviewed:    The following studies were reviewed today:      Recent Labs: 07/12/2020: Hemoglobin 14.8; Platelets 239 10/27/2020: ALT 19; BUN 17; Creatinine, Ser 1.20; Potassium 4.4; Sodium 141  Recent Lipid Panel    Component Value Date/Time   CHOL 194 10/27/2020 1220   TRIG 235 (H) 10/27/2020 1220   HDL 38 (L) 10/27/2020 1220   CHOLHDL 5.1 (H) 10/27/2020 1220   LDLCALC 115 (H) 10/27/2020 1220    Physical Exam:    Physical Exam: Blood pressure 104/60, pulse 74, height 5\' 8"  (1.727 m), weight 247 lb 9.6 oz (112.3 kg), SpO2 95 %.  GEN:  Well nourished, well developed in no acute distress HEENT: Normal NECK: No JVD; No carotid bruits LYMPHATICS: No lymphadenopathy CARDIAC: RRR , no murmurs, rubs, gallops RESPIRATORY:  Clear to auscultation without rales, wheezing or rhonchi  ABDOMEN: Soft, non-tender, non-distended MUSCULOSKELETAL:  No edema; No deformity  SKIN: Warm and  dry NEUROLOGIC:  Alert and oriented x 3    EKG:    ASSESSMENT:    No diagnosis found. PLAN:       Palpitation:     He seen to be feeling better. No significant palpitations  2.  Hyperlipidemia:   Needs to improve his diet.  Eats lots of carbs and especially white bread   .  I will see him again in 6 months for follow-up visit.   Medication Adjustments/Labs and  Tests Ordered: Current medicines are reviewed at length with the patient today.  Concerns regarding medicines are outlined above.  No orders of the defined types were placed in this encounter.  No orders of the defined types were placed in this encounter.   Patient Instructions  Medication Instructions:  Your physician recommends that you continue on your current medications as directed. Please refer to the Current Medication list given to you today.  *If you need a refill on your cardiac medications before your next appointment, please call your pharmacy*   Lab Work: none If you have labs (blood work) drawn today and your tests are completely normal, you will receive your results only by: Marland Kitchen MyChart Message (if you have MyChart) OR . A paper copy in the mail If you have any lab test that is abnormal or we need to change your treatment, we will call you to review the results.   Testing/Procedures: none   Follow-Up: At [facility], you and your health needs are our priority.  As part of our continuing mission to provide you with exceptional heart care, we have created designated Provider Care Teams.  These Care Teams include your primary Cardiologist (physician) and Advanced Practice Providers (APPs -  Physician Assistants and Nurse Practitioners) who all work together to provide you with the care you need, when you need it.   Your next appointment:   6 month(s)  The format for your next appointment:   In Person  Provider:   You will see one of the following Advanced Practice Providers on your  designated Care Team:    Richardson Dopp, PA-C  Robbie Lis, [place]       Signed, Mertie Moores, MD  12/09/2020 12:54 PM    [place]

## 2020-12-09 ENCOUNTER — Ambulatory Visit: Payer: Medicare HMO | Admitting: Cardiovascular Disease

## 2020-12-09 ENCOUNTER — Encounter: Payer: Self-pay | Admitting: Cardiovascular Disease

## 2020-12-09 ENCOUNTER — Other Ambulatory Visit: Payer: Self-pay

## 2020-12-09 DIAGNOSIS — R002 Palpitations: Secondary | ICD-10-CM | POA: Insufficient documentation

## 2020-12-09 DIAGNOSIS — E782 Mixed hyperlipidemia: Secondary | ICD-10-CM

## 2020-12-09 DIAGNOSIS — E785 Hyperlipidemia, unspecified: Secondary | ICD-10-CM | POA: Insufficient documentation

## 2020-12-09 NOTE — Patient Instructions (Signed)
Medication Instructions:  Your physician recommends that you continue on your current medications as directed. Please refer to the Current Medication list given to you today.  *If you need a refill on your cardiac medications before your next appointment, please call your pharmacy*   Lab Work: none If you have labs (blood work) drawn today and your tests are completely normal, you will receive your results only by: Marland Kitchen MyChart Message (if you have MyChart) OR . A paper copy in the mail If you have any lab test that is abnormal or we need to change your treatment, we will call you to review the results.   Testing/Procedures: none   Follow-Up: At [facility], you and your health needs are our priority.  As part of our continuing mission to provide you with exceptional heart care, we have created designated Provider Care Teams.  These Care Teams include your primary Cardiologist (physician) and Advanced Practice Providers (APPs -  Physician Assistants and Nurse Practitioners) who all work together to provide you with the care you need, when you need it.   Your next appointment:   6 month(s)  The format for your next appointment:   In Person  Provider:   You will see one of the following Advanced Practice Providers on your designated Care Team:    Richardson Dopp, PA-C  Vin Forsyth, [place]

## 2020-12-12 ENCOUNTER — Ambulatory Visit
Admission: RE | Admit: 2020-12-12 | Discharge: 2020-12-12 | Disposition: A | Discharge status: Home / Self Care | Payer: Medicare HMO | Source: Ambulatory Visit | Attending: Family Medicine | Admitting: Family Medicine

## 2020-12-12 ENCOUNTER — Other Ambulatory Visit: Payer: Medicare HMO

## 2020-12-12 DIAGNOSIS — N644 Mastodynia: Secondary | ICD-10-CM

## 2020-12-12 DIAGNOSIS — R922 Inconclusive mammogram: Secondary | ICD-10-CM | POA: Diagnosis not present

## 2020-12-12 DIAGNOSIS — N62 Hypertrophy of breast: Secondary | ICD-10-CM | POA: Diagnosis not present

## 2020-12-22 DIAGNOSIS — C61 Malignant neoplasm of prostate: Secondary | ICD-10-CM | POA: Diagnosis not present

## 2020-12-22 DIAGNOSIS — N529 Male erectile dysfunction, unspecified: Secondary | ICD-10-CM | POA: Diagnosis not present

## 2021-01-25 ENCOUNTER — Other Ambulatory Visit: Payer: Self-pay

## 2021-01-25 ENCOUNTER — Other Ambulatory Visit: Payer: Medicare HMO | Admitting: *Deleted

## 2021-01-25 DIAGNOSIS — E785 Hyperlipidemia, unspecified: Secondary | ICD-10-CM | POA: Diagnosis not present

## 2021-01-25 LAB — LIPID PANEL
Chol/HDL Ratio: 4.1 ratio (ref 0.0–5.0)
Cholesterol, Total: 169 mg/dL (ref 100–199)
HDL: 41 mg/dL (ref >39)
LDL Chol Calc (NIH): 104 mg/dL — ABNORMAL HIGH (ref 0–99)
Triglycerides: 133 mg/dL (ref 0–149)
VLDL Cholesterol Cal: 24 mg/dL (ref 5–40)

## 2021-01-25 LAB — BASIC METABOLIC PANEL
BUN/Creatinine Ratio: 18 (ref 10–24)
BUN: 23 mg/dL (ref 8–27)
CO2: 25 mmol/L (ref 20–29)
Calcium: 9.4 mg/dL (ref 8.6–10.2)
Chloride: 102 mmol/L (ref 96–106)
Creatinine, Ser: 1.31 mg/dL — ABNORMAL HIGH (ref 0.76–1.27)
Glucose: 98 mg/dL (ref 65–99)
Potassium: 4.7 mmol/L (ref 3.5–5.2)
Sodium: 139 mmol/L (ref 134–144)
eGFR: 57 mL/min/{1.73_m2} — ABNORMAL LOW (ref >59)

## 2021-01-25 LAB — HEPATIC FUNCTION PANEL
ALT: 17 IU/L (ref 0–44)
AST: 13 IU/L (ref 0–40)
Albumin: 4 g/dL (ref 3.7–4.7)
Alkaline Phosphatase: 62 IU/L (ref 44–121)
Bilirubin Total: 0.3 mg/dL (ref 0.0–1.2)
Bilirubin, Direct: <0.1 mg/dL (ref 0.00–0.40)
Total Protein: 6.6 g/dL (ref 6.0–8.5)

## 2021-01-26 ENCOUNTER — Other Ambulatory Visit: Payer: Medicare HMO

## 2021-01-30 ENCOUNTER — Telehealth: Payer: Self-pay

## 2021-01-30 DIAGNOSIS — E782 Mixed hyperlipidemia: Secondary | ICD-10-CM

## 2021-01-30 MED ORDER — EZETIMIBE 10 MG PO TABS: 10.0000 mg | ORAL_TABLET | Freq: Every day | ORAL | 3 refills | Status: DC

## 2021-01-30 NOTE — Telephone Encounter (Signed)
RN spoke to patient regarding lab results and new medication. Patient verbalized understanding. Appointment scheduled for 8/2 with the lab to check lipid and alt. Zetia orders entered.

## 2021-04-26 ENCOUNTER — Ambulatory Visit: Payer: Medicare HMO

## 2021-05-01 ENCOUNTER — Encounter (INDEPENDENT_AMBULATORY_CARE_PROVIDER_SITE_OTHER): Payer: Medicare HMO | Admitting: Ophthalmology

## 2021-05-02 ENCOUNTER — Other Ambulatory Visit: Payer: Medicare HMO

## 2021-06-01 ENCOUNTER — Ambulatory Visit (INDEPENDENT_AMBULATORY_CARE_PROVIDER_SITE_OTHER): Payer: Medicare HMO | Admitting: Ophthalmology

## 2021-06-01 ENCOUNTER — Other Ambulatory Visit: Payer: Self-pay

## 2021-06-01 ENCOUNTER — Encounter (INDEPENDENT_AMBULATORY_CARE_PROVIDER_SITE_OTHER): Payer: Self-pay | Admitting: Ophthalmology

## 2021-06-01 DIAGNOSIS — H43813 Vitreous degeneration, bilateral: Secondary | ICD-10-CM

## 2021-06-01 DIAGNOSIS — D3131 Benign neoplasm of right choroid: Secondary | ICD-10-CM

## 2021-06-01 DIAGNOSIS — H2513 Age-related nuclear cataract, bilateral: Secondary | ICD-10-CM | POA: Diagnosis not present

## 2021-06-01 NOTE — Assessment & Plan Note (Signed)

## 2021-06-01 NOTE — Assessment & Plan Note (Signed)
Progressive NSC, with darkening more right eye than left eye today.  Patient needs follow-up with Dr. Nathanial Rancher and [facility] eye care

## 2021-06-01 NOTE — Progress Notes (Signed)
06/01/2021     CHIEF COMPLAINT Patient presents for  Chief Complaint  Patient presents with   Retina Follow Up      HISTORY OF PRESENT ILLNESS: Theodore Lamb is a 75 y.o. male who presents to the clinic today for:   HPI     Retina Follow Up   Patient presents with  Other.  In both eyes.  This started 7 months ago.  Severity is mild.  Duration of 7 months.  Since onset it is stable.        Comments   7 month fu OU and OCT/FP Pt states, "I think my vision is about the same but it seems like I am getting more and more sensitive to bright sun light."       Last edited by Kendra Opitz, COA on 06/01/2021  8:45 AM.      Referring physician: Clent Jacks, MD [address] [place],  [place] [address]  HISTORICAL INFORMATION:   Selected notes from the [facility]: No current outpatient medications on file. (Ophthalmic Drugs)   No current facility-administered medications for this visit. (Ophthalmic Drugs)   Current Outpatient Medications (Other)  Medication Sig   aspirin EC 81 MG tablet Take 81 mg by mouth daily. Swallow whole.   buPROPion (WELLBUTRIN XL) 150 MG 24 hr tablet Take 1 tablet by mouth daily.   Cholecalciferol (VITAMIN D3) 10 MCG (400 UNIT) tablet Take 400 Units by mouth daily.   ezetimibe (ZETIA) 10 MG tablet Take 1 tablet (10 mg total) by mouth daily.   Glucosamine-Chondroitin (GLUCOSAMINE CHONDR COMPLEX PO) Take 1 tablet by mouth daily in the afternoon.   losartan (COZAAR) 100 MG tablet Take 1 tablet by mouth daily in the afternoon.   Multiple Vitamin (MULTIVITAMIN) tablet Take 1 tablet by mouth daily.   Niacin (VITAMIN B-3 PO) Take 1 tablet by mouth daily in the afternoon.   rosuvastatin (CRESTOR) 20 MG tablet Take 1 tablet (20 mg total) by mouth daily.   Zinc 50 MG TABS Take 1 tablet by mouth daily in the afternoon.   No current facility-administered medications for this visit. 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 Smoking :  smoking electric cigaretes . Alcohol : no     Substance Use Topics   Alcohol use: No    Alcohol/week: 0.0 standard drinks   Drug use: No         OPHTHALMIC EXAM:  Base Eye Exam     Visual Acuity (ETDRS)       Right Left   Dist cc 20/40 -1 20/20 -2   Dist ph cc NI          Tonometry (Tonopen, 8:51 AM)       Right Left   Pressure 14 14         Pupils       Pupils Dark Light Shape React APD   Right PERRL 3 2 Round Brisk None   Left PERRL 3 2 Round Brisk None         Visual Fields (Counting fingers)       Left Right    Full Full         Extraocular Movement       Right Left    Full Full         Neuro/Psych     Oriented x3: Yes         Dilation     Both eyes: 1.0% Mydriacyl, 2.5% Phenylephrine @ 8:51 AM           Slit Lamp and Fundus Exam     External Exam       Right Left   External Normal Normal         Slit Lamp Exam       Right Left   Lids/Lashes Normal Normal    Conjunctiva/Sclera White and quiet White and quiet   Cornea Clear Clear   Anterior Chamber Deep and quiet Deep and quiet   Iris Round and reactive Round and reactive   Lens 2+ Nuclear sclerosis 2+ Nuclear sclerosis   Anterior Vitreous Normal Normal         Fundus Exam       Right Left   Posterior Vitreous Posterior vitreous detachment Posterior vitreous detachment, vitreous debris   Disc Normal Normal   C/D Ratio 0.25 0.25   Macula Normal Normal   Vessels Normal Normal   Periphery Flat nevus inferonasal to nerve 3.0x1.75DD, no atrophy, no fluid, no high risk features Normal            IMAGING AND PROCEDURES  Imaging and Procedures for 06/01/21  OCT, Retina - OU - Both Eyes       Right Eye Quality was good. Scan locations included subfoveal. Central Foveal Thickness: 268. Progression has been stable. Findings include normal foveal contour.   Left Eye Quality was good. Scan locations included subfoveal. Central Foveal Thickness: 264. Progression has been stable. Findings include normal foveal contour.   Notes Incidental posterior vitreous detachment noted OU  With choroidal nevus inferonasal to the nerve OD and no interval change     Color Fundus Photography Optos - OU - Both Eyes       Right Eye Progression has been stable. Disc findings include normal observations. Macula : normal observations. Vessels : normal observations.   Left Eye Progression has been stable. Disc findings include normal observations. Macula : normal observations. Vessels : normal observations. Periphery : normal observations.   Notes Choroidal nevus inferonasal to nerve, flat low risk features.  No lipofuscin, no thickening, regular pigmentation, no atrophy, no vascularization.  No interval change year-over-year  ASSESSMENT/PLAN:  Nuclear sclerotic cataract of both eyes Progressive NSC, with darkening more right eye than left eye today.  Patient needs follow-up  with Dr. Nathanial Rancher and [facility] eye care  Posterior vitreous detachment of both eyes  The nature of posterior vitreous detachment was discussed with the patient as well as its physiology, its age prevalence, and its possible implication regarding retinal breaks and detachment.  An informational brochure was offered to the patient.  All the patient's questions were answered.  The patient was asked to return if new or different flashes or floaters develops.   Patient was instructed to contact office immediately if any new changes were noticed. I explained to the patient that vitreous inside the eye is similar to jello inside a bowl. As the jello melts it can start to pull away from the bowl, similarly the vitreous throughout our lives can begin to pull away from the retina. That process is called a posterior vitreous detachment. In some cases, the vitreous can tug hard enough on the retina to form a retinal tear. I discussed with the patient the signs and symptoms of a retinal detachment.  Do not rub the eye.    Choroidal nevus, right The nature of choroidal nevus was discussed with the patient and an informational form was offered.  Photo documentation was discussed with the patient.  Periodic follow-up may be needed for a lifetime. The patient's questions were answered. At minimum, annual exams will be needed if no signs of early growth.      ICD-10-CM   1. 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 Posterior vitreous detachment of both eyes  H43.813 OCT, Retina - OU - Both Eyes    3. Nuclear sclerotic cataract of both eyes  H25.13       1.  OD no change in choroidal nevus over the last 9 months follow-up in 1 year  2.  Discussion regarding cataract development in both eyes.  Patient follow-up with Dr. Carolynn Sayers of [facility] eye care  3.  Ophthalmic Meds Ordered this visit:  No orders of the defined types were placed in  this encounter.      Return in about 1 year (around 06/01/2022) for DILATE OU, COLOR FP, OCT.  There are no Patient Instructions on file for this visit.   Explained the diagnoses, plan, and follow up with the patient and they expressed understanding.  Patient expressed understanding of the importance of proper follow up care.   Clent Demark Reubin Bushnell M.D. Diseases & Surgery of the Retina and Vitreous Retina & Diabetic [place] 06/01/21     Abbreviations: M myopia (nearsighted); A astigmatism; H hyperopia (farsighted); P presbyopia; Mrx spectacle prescription;  CTL contact lenses; OD right eye; OS left eye; OU both eyes  XT exotropia; ET esotropia; PEK punctate epithelial keratitis; PEE punctate epithelial erosions; DES dry eye syndrome; MGD meibomian gland dysfunction; ATs artificial tears; PFAT's preservative free artificial tears; Deputy nuclear sclerotic cataract; PSC posterior subcapsular cataract; ERM epi-retinal membrane; PVD posterior vitreous detachment; RD retinal detachment; DM diabetes mellitus; DR diabetic retinopathy; NPDR non-proliferative diabetic retinopathy; PDR proliferative diabetic retinopathy; CSME clinically significant macular edema; DME diabetic macular edema; dbh dot blot hemorrhages; CWS cotton wool spot; POAG primary open angle glaucoma; C/D cup-to-disc ratio; HVF humphrey visual field; GVF goldmann visual field; OCT optical coherence tomography; IOP intraocular pressure; BRVO Branch retinal vein  occlusion; CRVO central retinal vein occlusion; CRAO central retinal artery occlusion; BRAO branch retinal artery occlusion; RT retinal tear; SB scleral buckle; PPV pars plana vitrectomy; VH Vitreous hemorrhage; PRP panretinal laser photocoagulation; IVK intravitreal kenalog; VMT vitreomacular traction; MH Macular hole;  NVD neovascularization of the disc; NVE neovascularization elsewhere; AREDS age related eye disease study; ARMD age related macular degeneration; POAG primary open angle  glaucoma; EBMD epithelial/anterior basement membrane dystrophy; ACIOL anterior chamber intraocular lens; IOL intraocular lens; PCIOL posterior chamber intraocular lens; Phaco/IOL phacoemulsification with intraocular lens placement; [place] photorefractive keratectomy; LASIK laser assisted in situ keratomileusis; HTN hypertension; DM diabetes mellitus; COPD chronic obstructive pulmonary disease

## 2021-06-01 NOTE — Assessment & Plan Note (Signed)
The nature of choroidal nevus was discussed with the patient and an informational form was offered.  Photo documentation was discussed with the patient.  Periodic follow-up may be needed for a lifetime. The patient's questions were answered. At minimum, annual exams will be needed if no signs of early growth. 

## 2021-06-15 DIAGNOSIS — H04123 Dry eye syndrome of bilateral lacrimal glands: Secondary | ICD-10-CM | POA: Diagnosis not present

## 2021-06-15 DIAGNOSIS — D3131 Benign neoplasm of right choroid: Secondary | ICD-10-CM | POA: Diagnosis not present

## 2021-06-15 DIAGNOSIS — H2513 Age-related nuclear cataract, bilateral: Secondary | ICD-10-CM | POA: Diagnosis not present

## 2021-06-15 DIAGNOSIS — H5010 Unspecified exotropia: Secondary | ICD-10-CM | POA: Diagnosis not present

## 2021-06-15 DIAGNOSIS — H43813 Vitreous degeneration, bilateral: Secondary | ICD-10-CM | POA: Diagnosis not present

## 2021-07-06 DIAGNOSIS — N529 Male erectile dysfunction, unspecified: Secondary | ICD-10-CM | POA: Diagnosis not present

## 2021-07-06 DIAGNOSIS — C61 Malignant neoplasm of prostate: Secondary | ICD-10-CM | POA: Diagnosis not present

## 2021-07-31 DIAGNOSIS — M25461 Effusion, right knee: Secondary | ICD-10-CM | POA: Diagnosis not present

## 2021-07-31 DIAGNOSIS — M1711 Unilateral primary osteoarthritis, right knee: Secondary | ICD-10-CM | POA: Diagnosis not present

## 2021-08-15 ENCOUNTER — Other Ambulatory Visit: Payer: Self-pay | Admitting: Family Medicine

## 2021-08-15 DIAGNOSIS — M25561 Pain in right knee: Secondary | ICD-10-CM

## 2021-08-27 ENCOUNTER — Other Ambulatory Visit: Payer: Self-pay

## 2021-08-27 ENCOUNTER — Ambulatory Visit
Admission: RE | Admit: 2021-08-27 | Discharge: 2021-08-27 | Disposition: A | Discharge status: Home / Self Care | Payer: Medicare HMO | Source: Ambulatory Visit | Attending: Family Medicine | Admitting: Family Medicine

## 2021-08-27 DIAGNOSIS — M25561 Pain in right knee: Secondary | ICD-10-CM

## 2021-08-30 DIAGNOSIS — S83241A Other tear of medial meniscus, current injury, right knee, initial encounter: Secondary | ICD-10-CM | POA: Diagnosis not present

## 2021-08-30 DIAGNOSIS — M1711 Unilateral primary osteoarthritis, right knee: Secondary | ICD-10-CM | POA: Diagnosis not present

## 2021-08-30 DIAGNOSIS — M25461 Effusion, right knee: Secondary | ICD-10-CM | POA: Diagnosis not present

## 2021-09-07 DIAGNOSIS — M25661 Stiffness of right knee, not elsewhere classified: Secondary | ICD-10-CM | POA: Diagnosis not present

## 2021-09-07 DIAGNOSIS — S83203D Other tear of unspecified meniscus, current injury, right knee, subsequent encounter: Secondary | ICD-10-CM | POA: Diagnosis not present

## 2021-09-07 DIAGNOSIS — M6281 Muscle weakness (generalized): Secondary | ICD-10-CM | POA: Diagnosis not present

## 2021-09-13 DIAGNOSIS — M25661 Stiffness of right knee, not elsewhere classified: Secondary | ICD-10-CM | POA: Diagnosis not present

## 2021-09-13 DIAGNOSIS — S83203D Other tear of unspecified meniscus, current injury, right knee, subsequent encounter: Secondary | ICD-10-CM | POA: Diagnosis not present

## 2021-09-13 DIAGNOSIS — M6281 Muscle weakness (generalized): Secondary | ICD-10-CM | POA: Diagnosis not present

## 2021-10-04 DIAGNOSIS — H04123 Dry eye syndrome of bilateral lacrimal glands: Secondary | ICD-10-CM | POA: Diagnosis not present

## 2021-10-04 DIAGNOSIS — D3131 Benign neoplasm of right choroid: Secondary | ICD-10-CM | POA: Diagnosis not present

## 2021-10-04 DIAGNOSIS — H43813 Vitreous degeneration, bilateral: Secondary | ICD-10-CM | POA: Diagnosis not present

## 2021-10-04 DIAGNOSIS — H5010 Unspecified exotropia: Secondary | ICD-10-CM | POA: Diagnosis not present

## 2021-10-04 DIAGNOSIS — H2513 Age-related nuclear cataract, bilateral: Secondary | ICD-10-CM | POA: Diagnosis not present

## 2021-10-20 DIAGNOSIS — H25811 Combined forms of age-related cataract, right eye: Secondary | ICD-10-CM | POA: Diagnosis not present

## 2021-11-01 DIAGNOSIS — H2512 Age-related nuclear cataract, left eye: Secondary | ICD-10-CM | POA: Diagnosis not present

## 2021-11-03 DIAGNOSIS — H25812 Combined forms of age-related cataract, left eye: Secondary | ICD-10-CM | POA: Diagnosis not present

## 2021-11-08 ENCOUNTER — Other Ambulatory Visit: Payer: Medicare HMO | Admitting: *Deleted

## 2021-11-08 ENCOUNTER — Other Ambulatory Visit: Payer: Self-pay

## 2021-11-08 DIAGNOSIS — E782 Mixed hyperlipidemia: Secondary | ICD-10-CM

## 2021-11-08 LAB — LIPID PANEL
Chol/HDL Ratio: 2.9 ratio (ref 0.0–5.0)
Cholesterol, Total: 135 mg/dL (ref 100–199)
HDL: 46 mg/dL (ref >39)
LDL Chol Calc (NIH): 66 mg/dL (ref 0–99)
Triglycerides: 130 mg/dL (ref 0–149)
VLDL Cholesterol Cal: 23 mg/dL (ref 5–40)

## 2021-11-08 LAB — ALT: ALT: 22 IU/L (ref 0–44)

## 2021-11-14 DIAGNOSIS — M25461 Effusion, right knee: Secondary | ICD-10-CM | POA: Diagnosis not present

## 2021-11-14 DIAGNOSIS — M1711 Unilateral primary osteoarthritis, right knee: Secondary | ICD-10-CM | POA: Diagnosis not present

## 2021-11-21 DIAGNOSIS — M1711 Unilateral primary osteoarthritis, right knee: Secondary | ICD-10-CM | POA: Diagnosis not present

## 2021-11-23 ENCOUNTER — Other Ambulatory Visit: Payer: Self-pay | Admitting: Cardiovascular Disease

## 2021-11-28 DIAGNOSIS — M1711 Unilateral primary osteoarthritis, right knee: Secondary | ICD-10-CM | POA: Diagnosis not present

## 2021-12-10 ENCOUNTER — Encounter: Payer: Self-pay | Admitting: Cardiovascular Disease

## 2021-12-10 NOTE — Progress Notes (Signed)
Cardiology Office Note:    Date:  12/11/2021   ID:  [name], DOB 11-Jun-1946, MRN [id number]  PCP:  Lawerance Cruel, MD  [facility] Cardiologist:  Teagen Mcleary  [facility] Electrophysiologist:  None   Referring MD: Lawerance Cruel, MD   Chief Complaint  Patient presents with   Chest Pain   Palpitations     Sept. 7, 2021   Theodore Lamb is a 76 y.o. male with a hx of  Palpitations and blurred vision. We were asked to see him today by Dr. Drema Dallas for further evaluation of his palpitations.   Has been having unpredictable spells for the past 3-4 weeks.  Occurs spontaneously,   Not preceded by anything in particular  May be associated with eating  - several have occurred 30 min after eating .  Works around his house.   Does not exercise per se.   Does not walk much  Is limited by back pain .  The spells will last 15-30 min.  Has mild episodes that start with mild mid sternal pressure,  A flutter sensation, mild heart burn,  And the sensation that he needs to take more frequent breaths . Som bad episodes cause some vision changes, nausea , hands and feet numbness.  Assoc with tightness in his throat .   Checks his Omron BP cuff and his VS are normal .   Eats and drinks regularly .  Not associated with syncope or presyncope  The symptoms dissapate over 1-2 minutes and then he feels well .   Has been seen by Dr. Wynonia Lawman in the past  Has worn a monitor and did an echo  Was told his echo was fine .   December 09, 2020:   Theodore Lamb is seen today for follow up of his palpitations,  Moderate CAD , HLD   CCTA  in Oct. 2021 reveals : Coronary calcium score: The patient's coronary artery calcium score is 438, which places the patient in the 46 percentile. 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    December 11, 2021: Theodore Lamb is seen for follow up of his palpitations, moderate CAD, HLD  Eats bacon regularly ,  might explain his BP elevation  Does not eat fast food  Wt is 247 lbs.       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   ezetimibe (ZETIA) 10 MG tablet Take 1 tablet (10 mg total) by mouth daily.   Glucosamine-Chondroitin (GLUCOSAMINE CHONDR COMPLEX PO) Take 1 tablet by mouth daily in the afternoon.   Multiple Vitamin (MULTIVITAMIN) tablet Take 1 tablet by mouth daily.   Niacin (VITAMIN B-3 PO) Take 1 tablet by mouth daily in the afternoon.   rosuvastatin (CRESTOR) 20 MG tablet TAKE ONE TABLET EACH DAY   Zinc 50 MG TABS Take 1 tablet by mouth daily in the afternoon.   [DISCONTINUED] losartan (COZAAR) 100 MG tablet Take 1 tablet by mouth daily in the afternoon.     Allergies:   Bupropion, Candesartan cilexetil, and Leuprolide   Social History   Socioeconomic History   Marital status: Divorced    Spouse name: Not on file   Number of children: Not on file   Years of education: Not on file   Highest education level: Not on file  Occupational History   Not on file   Tobacco Use   Smoking status: Every Day   Smokeless tobacco: Current   Tobacco comments:    frequency  1 PPD . Smoking : smoking electric cigaretes . Alcohol : no     Substance and Sexual Activity   Alcohol use: No    Alcohol/week: 0.0 standard drinks   Drug use: No   Sexual activity: Not on file  Other Topics Concern   Not on file  Social History Narrative   History of smoking cigarettes :Current smoker,   Frequency : 1 PPD Smoking: smoking electric  cigaretes. Alcohol: no.   Social Determinants of Health   Financial Resource Strain: Not on file  Food Insecurity: Not on file  Transportation Needs: Not on file  Physical Activity: Not on file  Stress: Not on file  Social Connections: Not on file     Family History: The patient's family history includes Colon cancer in an other family member; Testicular cancer in his paternal uncle.  ROS:   Please see the history of present illness.     All other systems reviewed and are negative.  EKGs/Labs/Other Studies Reviewed:    The following studies were reviewed today:      Recent Labs: 01/25/2021: BUN 23; Creatinine, Ser 1.31; Potassium 4.7; Sodium 139 11/08/2021: ALT 22  Recent Lipid Panel    Component Value Date/Time   CHOL 135 11/08/2021 0935   TRIG 130 11/08/2021 0935   HDL 46 11/08/2021 0935   CHOLHDL 2.9 11/08/2021 0935   LDLCALC 66 11/08/2021 0935    Physical Exam:     Physical Exam: Blood pressure 140/68, pulse 75, height '5\' 8"'$  (1.727 m), weight 247 lb 9.6 oz (112.3 kg), SpO2 93 %.  GEN:  Well nourished, well developed in no acute distress HEENT: Normal NECK: No JVD; No carotid bruits LYMPHATICS: No lymphadenopathy CARDIAC: RRR , no murmurs, rubs, gallops RESPIRATORY:  Clear to auscultation without rales, wheezing or rhonchi  ABDOMEN: Soft, non-tender, non-distended MUSCULOSKELETAL:  No edema; No deformity  SKIN: Warm and dry NEUROLOGIC:  Alert and oriented x 3  EKG:   December 11, 2021 :  NSR at 75,  INC RBBB  ASSESSMENT:    1. Hyperlipidemia LDL goal <70   2. Hypertension, unspecified type    PLAN:       1.  Hypertension: Blood pressure is mildly elevated.  I think he would  do better if he avoids eating highly processed foods.  I have also recommended diet, exercise, weight loss.       2.  Hyperlipidemia:   Lipids have been fairly well controlled.   Total cholesterol is 135, HDL is 46, triglycerides are 130, LDL 66 .  Continue Zetia 10 mg a day, rosuvastatin 20 mg a day  .    Medication Adjustments/Labs and Tests Ordered: Current medicines are reviewed at length with the patient today.  Concerns regarding medicines are outlined above.  Orders Placed This Encounter  Procedures   EKG 12-Lead   Meds ordered this encounter  Medications   losartan (COZAAR) 100 MG tablet    Sig: Take 1 tablet (100 mg total) by mouth daily in the afternoon.    Dispense:  90 tablet    Refill:  3     Patient Instructions  Medication Instructions:  Your physician recommends that you continue on your current medications as directed. Please refer to the Current Medication list given to you today.  *If you need a refill on your cardiac medications before your next appointment, please call your pharmacy*  Lab Work: NONE If you have labs (blood work) drawn today and your tests are completely normal, you will receive your results only by: [place] (if you have MyChart) OR A paper copy in the mail If you have any lab test that is abnormal or we need to change your treatment, we will call you to review the results.  Testing/Procedures: NONE  Follow-Up: At [facility], you and your health needs are our priority.  As part of our continuing mission to provide you with exceptional heart care, we have created designated Provider Care Teams.  These Care Teams include your primary Cardiologist (physician) and Advanced Practice Providers (APPs -  Physician Assistants and Nurse Practitioners)  who all work together to provide you with the care you need, when you need it.  Your next appointment:   6 month(s)  The format for your next appointment:   In Person  Provider:   Robbie Lis, PA-C or Richardson Dopp, PA-C   Signed, Mertie Moores, MD  12/11/2021 3:59 PM    [place]

## 2021-12-11 ENCOUNTER — Other Ambulatory Visit: Payer: Self-pay

## 2021-12-11 ENCOUNTER — Ambulatory Visit: Payer: Medicare HMO | Admitting: Cardiovascular Disease

## 2021-12-11 ENCOUNTER — Encounter: Payer: Self-pay | Admitting: Cardiovascular Disease

## 2021-12-11 VITALS — BP 140/68 | HR 75 | Ht 68.0 in | Wt 247.6 lb

## 2021-12-11 DIAGNOSIS — I10 Essential (primary) hypertension: Secondary | ICD-10-CM | POA: Diagnosis not present

## 2021-12-11 DIAGNOSIS — E785 Hyperlipidemia, unspecified: Secondary | ICD-10-CM | POA: Diagnosis not present

## 2021-12-11 MED ORDER — LOSARTAN POTASSIUM 100 MG PO TABS: 100.0000 mg | ORAL_TABLET | Freq: Every day | ORAL | 3 refills | Status: DC

## 2021-12-11 NOTE — Patient Instructions (Signed)
Medication Instructions:  ?Your physician recommends that you continue on your current medications as directed. Please refer to the Current Medication list given to you today. ? ?*If you need a refill on your cardiac medications before your next appointment, please call your pharmacy* ? ?Lab Work: ?NONE ?If you have labs (blood work) drawn today and your tests are completely normal, you will receive your results only by: ?MyChart Message (if you have MyChart) OR ?A paper copy in the mail ?If you have any lab test that is abnormal or we need to change your treatment, we will call you to review the results. ? ?Testing/Procedures: ?NONE ? ?Follow-Up: ?At [facility], you and your health needs are our priority.  As part of our continuing mission to provide you with exceptional heart care, we have created designated Provider Care Teams.  These Care Teams include your primary Cardiologist (physician) and Advanced Practice Providers (APPs -  Physician Assistants and Nurse Practitioners) who all work together to provide you with the care you need, when you need it. ? ?Your next appointment:   ?6 month(s) ? ?The format for your next appointment:   ?In Person ? ?Provider:   ?Robbie Lis, PA-C or Richardson Dopp, PA-C ?

## 2021-12-13 DIAGNOSIS — Z0100 Encounter for examination of eyes and vision without abnormal findings: Secondary | ICD-10-CM | POA: Diagnosis not present

## 2021-12-13 DIAGNOSIS — H524 Presbyopia: Secondary | ICD-10-CM | POA: Diagnosis not present

## 2021-12-21 DIAGNOSIS — B078 Other viral warts: Secondary | ICD-10-CM | POA: Diagnosis not present

## 2021-12-21 DIAGNOSIS — D2271 Melanocytic nevi of right lower limb, including hip: Secondary | ICD-10-CM | POA: Diagnosis not present

## 2021-12-21 DIAGNOSIS — D2272 Melanocytic nevi of left lower limb, including hip: Secondary | ICD-10-CM | POA: Diagnosis not present

## 2021-12-21 DIAGNOSIS — L812 Freckles: Secondary | ICD-10-CM | POA: Diagnosis not present

## 2021-12-21 DIAGNOSIS — L218 Other seborrheic dermatitis: Secondary | ICD-10-CM | POA: Diagnosis not present

## 2021-12-21 DIAGNOSIS — L570 Actinic keratosis: Secondary | ICD-10-CM | POA: Diagnosis not present

## 2021-12-21 DIAGNOSIS — D485 Neoplasm of uncertain behavior of skin: Secondary | ICD-10-CM | POA: Diagnosis not present

## 2021-12-21 DIAGNOSIS — L821 Other seborrheic keratosis: Secondary | ICD-10-CM | POA: Diagnosis not present

## 2021-12-21 DIAGNOSIS — D225 Melanocytic nevi of trunk: Secondary | ICD-10-CM | POA: Diagnosis not present

## 2022-01-11 DIAGNOSIS — D485 Neoplasm of uncertain behavior of skin: Secondary | ICD-10-CM | POA: Diagnosis not present

## 2022-01-11 DIAGNOSIS — B078 Other viral warts: Secondary | ICD-10-CM | POA: Diagnosis not present

## 2022-01-11 DIAGNOSIS — L988 Other specified disorders of the skin and subcutaneous tissue: Secondary | ICD-10-CM | POA: Diagnosis not present

## 2022-02-28 DIAGNOSIS — M25461 Effusion, right knee: Secondary | ICD-10-CM | POA: Diagnosis not present

## 2022-02-28 DIAGNOSIS — M1711 Unilateral primary osteoarthritis, right knee: Secondary | ICD-10-CM | POA: Diagnosis not present

## 2022-03-15 DIAGNOSIS — C61 Malignant neoplasm of prostate: Secondary | ICD-10-CM | POA: Diagnosis not present

## 2022-03-15 DIAGNOSIS — N529 Male erectile dysfunction, unspecified: Secondary | ICD-10-CM | POA: Diagnosis not present

## 2022-04-04 ENCOUNTER — Other Ambulatory Visit: Payer: Self-pay | Admitting: Cardiovascular Disease

## 2022-05-02 DIAGNOSIS — M1711 Unilateral primary osteoarthritis, right knee: Secondary | ICD-10-CM | POA: Diagnosis not present

## 2022-05-02 DIAGNOSIS — M25561 Pain in right knee: Secondary | ICD-10-CM | POA: Diagnosis not present

## 2022-05-02 DIAGNOSIS — S83241A Other tear of medial meniscus, current injury, right knee, initial encounter: Secondary | ICD-10-CM | POA: Diagnosis not present

## 2022-05-04 ENCOUNTER — Other Ambulatory Visit (HOSPITAL_COMMUNITY): Payer: Self-pay | Admitting: Family Medicine

## 2022-05-04 ENCOUNTER — Ambulatory Visit (HOSPITAL_COMMUNITY)
Admission: RE | Admit: 2022-05-04 | Discharge: 2022-05-04 | Disposition: A | Discharge status: Home / Self Care | Payer: Medicare HMO | Source: Ambulatory Visit | Attending: Cardiology | Admitting: Cardiology

## 2022-05-04 DIAGNOSIS — M79661 Pain in right lower leg: Secondary | ICD-10-CM

## 2022-05-04 DIAGNOSIS — M7989 Other specified soft tissue disorders: Secondary | ICD-10-CM

## 2022-05-04 DIAGNOSIS — M79604 Pain in right leg: Secondary | ICD-10-CM | POA: Diagnosis not present

## 2022-05-04 DIAGNOSIS — I8001 Phlebitis and thrombophlebitis of superficial vessels of right lower extremity: Secondary | ICD-10-CM | POA: Diagnosis not present

## 2022-05-04 DIAGNOSIS — R2241 Localized swelling, mass and lump, right lower limb: Secondary | ICD-10-CM | POA: Diagnosis not present

## 2022-06-07 ENCOUNTER — Encounter (INDEPENDENT_AMBULATORY_CARE_PROVIDER_SITE_OTHER): Payer: Self-pay | Admitting: Ophthalmology

## 2022-06-07 ENCOUNTER — Ambulatory Visit (INDEPENDENT_AMBULATORY_CARE_PROVIDER_SITE_OTHER): Payer: Medicare HMO | Admitting: Ophthalmology

## 2022-06-07 DIAGNOSIS — H43813 Vitreous degeneration, bilateral: Secondary | ICD-10-CM

## 2022-06-07 DIAGNOSIS — D3131 Benign neoplasm of right choroid: Secondary | ICD-10-CM

## 2022-06-07 DIAGNOSIS — Z961 Presence of intraocular lens: Secondary | ICD-10-CM | POA: Insufficient documentation

## 2022-06-07 NOTE — Assessment & Plan Note (Signed)

## 2022-06-07 NOTE — Assessment & Plan Note (Signed)
Dr. Midge Aver looks great

## 2022-06-07 NOTE — Assessment & Plan Note (Signed)
The nature of choroidal nevus was discussed with the patient and an informational form was offered.  Photo documentation was discussed with the patient.  Periodic follow-up may be needed for a lifetime. The patient's questions were answered. At minimum, annual exams will be needed if no signs of early growth. 

## 2022-06-07 NOTE — Progress Notes (Signed)
06/07/2022     CHIEF COMPLAINT Patient presents for  Chief Complaint  Patient presents with   Retina Follow Up      HISTORY OF PRESENT ILLNESS: Theodore Lamb is a 76 y.o. male who presents to the clinic today for:   HPI     Retina Follow Up           Diagnosis: Other   Laterality: right eye   Severity: moderate   Course: stable         Comments   1 YR FU OU OCT. Pt stated vision has improved since last visit. Pt had cataract sx in both eyes performed by Dr. Midge Aver. Pt reports, "I can actually read the road sogns and I can take off my glasses and see from a distance now."        Last edited by Gaylyn Cheers, [place] on 06/07/2022  8:24 AM.     Choroidal nevus inferonasal retina OD.  Patient had recent cataract surgery in the past year with Dr. Warden Fillers Referring physician: Warden Fillers, MD [address] [place],  [place] [id number]  HISTORICAL INFORMATION:   Selected notes from the [facility]: No current outpatient medications on file. (Ophthalmic Drugs)   No current facility-administered medications for this visit. (Ophthalmic Drugs)   Current Outpatient Medications (Other)  Medication Sig   aspirin EC 81 MG tablet Take 81 mg by mouth daily. Swallow whole. (Patient not taking: Reported on 12/11/2021)   buPROPion (WELLBUTRIN XL) 150 MG 24 hr tablet Take 1 tablet by mouth daily. (Patient not taking: Reported on 12/11/2021)   Cholecalciferol (VITAMIN D3) 10 MCG (400 UNIT) tablet Take 400 Units by mouth daily.   ezetimibe (ZETIA) 10 MG tablet TAKE ONE TABLET BY MOUTH ONCE DAILY   Glucosamine-Chondroitin (GLUCOSAMINE CHONDR COMPLEX PO) Take 1 tablet by mouth daily in the afternoon.   losartan (COZAAR) 100 MG tablet Take 1 tablet (100 mg total) by mouth daily in the afternoon.   Multiple Vitamin (MULTIVITAMIN) tablet Take 1 tablet by mouth daily.   Niacin (VITAMIN B-3 PO) Take 1 tablet by mouth daily in the  afternoon.   rosuvastatin (CRESTOR) 20 MG tablet TAKE ONE TABLET EACH DAY   Zinc 50 MG TABS Take 1 tablet by mouth daily in the afternoon.   No current facility-administered medications for this visit. (Other)      REVIEW OF SYSTEMS: ROS   Negative for: Constitutional, Gastrointestinal, Neurological, Skin, Genitourinary, Musculoskeletal, HENT, Endocrine, Cardiovascular, Eyes, Respiratory, Psychiatric, Allergic/Imm, Heme/Lymph Last edited by Silvestre Moment on 06/07/2022  8:23 AM.       ALLERGIES Allergies  Allergen Reactions   Bupropion     Other reaction(s): insomnia   Candesartan Cilexetil     Other reaction(s): does not feel  well.   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 Smoking : smoking electric cigaretes . Alcohol : no     Substance Use Topics   Alcohol use: No    Alcohol/week: 0.0 standard drinks of alcohol   Drug use: No         OPHTHALMIC EXAM:  Base Eye Exam     Visual Acuity (ETDRS)       Right Left    Dist cc 20/30 -1 +2 20/20   Dist ph cc NI     Correction: Glasses         Tonometry (Tonopen, 8:29 AM)       Right Left   Pressure 14 13         Pupils       Pupils APD   Right PERRL None   Left PERRL None         Visual Fields       Left Right    Full Full         Extraocular Movement       Right Left    Full, Ortho Full, Ortho         Neuro/Psych     Oriented x3: Yes         Dilation     Both eyes: 1.0% Mydriacyl, 2.5% Phenylephrine @ 8:29 AM           Slit Lamp and Fundus Exam     External Exam       Right Left   External Normal Normal         Slit Lamp Exam       Right Left   Lids/Lashes Normal Normal   Conjunctiva/Sclera White and quiet White and quiet   Cornea Clear Clear   Anterior Chamber Deep and quiet Deep and quiet   Iris Round and reactive Round and reactive   Lens Centered posterior chamber intraocular lens Centered posterior chamber intraocular lens   Anterior Vitreous Normal Normal         Fundus Exam       Right Left   Posterior Vitreous Posterior vitreous detachment Posterior vitreous detachment, vitreous debris   Disc Normal Normal   C/D Ratio 0.25 0.25   Macula Normal Normal   Vessels Normal Normal   Periphery Flat nevus inferonasal to nerve 3.0x1.75DD, no atrophy, no fluid, no high risk features Normal            IMAGING AND PROCEDURES  Imaging and Procedures for 06/07/22  OCT, Retina - OU - Both Eyes       Right Eye Quality was good. Scan locations included subfoveal. Central Foveal Thickness: 276. Progression has been stable. Findings include normal foveal contour.   Left Eye Quality was good. Scan locations included subfoveal. Central Foveal Thickness: 272. Progression has been stable. Findings include normal foveal contour.   Notes Incidental posterior vitreous detachment noted OU  With choroidal nevus inferonasal to the nerve OD  and no interval change              ASSESSMENT/PLAN:  Posterior vitreous detachment of both eyes  The nature of posterior vitreous detachment was discussed with the patient as well as its physiology, its age prevalence, and its possible implication regarding retinal breaks and detachment.  An informational brochure was offered to the patient.  All the patient's questions were answered.  The patient was asked to return if new or different flashes or floaters develops.   Patient was instructed to contact office immediately if any new changes were noticed. I explained to the patient that vitreous inside the eye is similar to jello inside a bowl. As the jello melts it can start to pull away from the bowl, similarly the vitreous throughout our lives can begin to pull away from the retina. That process is called a posterior vitreous detachment. In some cases, the vitreous can tug hard enough on the retina to form a retinal tear. I discussed with the patient the signs and symptoms of a retinal detachment.  Do not rub the eye.    Pseudophakia, both eyes Dr. Midge Aver looks great  Choroidal nevus, right The nature of choroidal nevus was discussed with the patient and an informational form was offered.  Photo documentation was discussed with the patient.  Periodic follow-up may be needed for a lifetime. The patient's questions were answered. At minimum, annual exams will be needed if no signs of early growth.     ICD-10-CM   1. 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 Posterior vitreous detachment of both eyes  H43.813     3. Pseudophakia, both eyes  Z96.1       1.  OU doing very well.  PVD incidental and physiologic.  2.  OU new pseudophakia patient very pleased with new prescription which is nearly emmetropic at distance  3.  Choroidal nevus OD stable no high risk features, will continue to monitor tOphthalmic Meds Ordered this visit:  No orders of the defined types were placed in this encounter.       Return in about 2 years (around 06/07/2024) for DILATE OU, COLOR FP, OCT.  There are no Patient Instructions on file for this visit.   Explained the diagnoses, plan, and follow up with the patient and they expressed understanding.  Patient expressed understanding of the importance of proper follow up care.   Clent Demark Akeia Perot M.D. Diseases & Surgery of the Retina and Vitreous Retina & Diabetic [place] 06/07/22     Abbreviations: M myopia (nearsighted); A astigmatism; H hyperopia (farsighted); P presbyopia; Mrx spectacle prescription;  CTL contact lenses; OD right eye; OS left eye; OU both eyes  XT exotropia; ET esotropia; PEK punctate epithelial keratitis; PEE punctate epithelial erosions; DES dry eye syndrome; MGD meibomian gland dysfunction; ATs artificial tears; PFAT's preservative free artificial tears; [place] nuclear sclerotic cataract; PSC posterior subcapsular cataract; ERM epi-retinal membrane; PVD posterior vitreous detachment; RD retinal detachment; DM diabetes mellitus; DR diabetic retinopathy; NPDR non-proliferative diabetic retinopathy; PDR proliferative diabetic retinopathy; CSME clinically significant macular edema; DME diabetic macular edema; dbh dot blot hemorrhages; CWS cotton wool spot; POAG primary open angle glaucoma; C/D cup-to-disc ratio; HVF humphrey visual field; GVF goldmann visual field; OCT optical coherence tomography; IOP intraocular pressure; BRVO Branch retinal vein occlusion; CRVO central retinal vein occlusion; CRAO central retinal artery occlusion; BRAO branch retinal artery occlusion; RT retinal tear; SB scleral buckle; PPV  pars plana vitrectomy; VH Vitreous hemorrhage; PRP panretinal laser photocoagulation; IVK intravitreal kenalog; VMT vitreomacular traction; MH Macular hole;  NVD neovascularization of the disc; NVE neovascularization elsewhere; AREDS age related eye disease study; ARMD age related macular degeneration; POAG primary open angle glaucoma; EBMD  epithelial/anterior basement membrane dystrophy; ACIOL anterior chamber intraocular lens; IOL intraocular lens; PCIOL posterior chamber intraocular lens; Phaco/IOL phacoemulsification with intraocular lens placement; [place] photorefractive keratectomy; LASIK laser assisted in situ keratomileusis; HTN hypertension; DM diabetes mellitus; COPD chronic obstructive pulmonary disease

## 2022-06-11 ENCOUNTER — Telehealth: Payer: Self-pay | Admitting: Cardiovascular Disease

## 2022-06-11 NOTE — Telephone Encounter (Signed)
Returned call to patient. He was here last in March and lipids/ALT were done in [place] med changes at March appt. Informed him that there really wasn't a need to do repeat labs on cholesterol, but that Dr Acie Fredrickson may opt for a BMET since last on file was 01/25/21. Explained that this could be done same day as visit though and he didn't need to make an extra trip here. Pt understands and is okay with waiting until his visit to see what MD wants to order.

## 2022-06-11 NOTE — Telephone Encounter (Signed)
  Per MyChart Scheduling message:  I have always had a LAB PRIOR to seeing Doc-this is missing this time-WHY? Please reply   Patient is scheduled with [facility] on 06/15/22

## 2022-06-11 NOTE — Telephone Encounter (Signed)
Patient wants to know if he needs to have labs done prior to his visit on 06/15/22.

## 2022-06-14 ENCOUNTER — Encounter: Payer: Self-pay | Admitting: Cardiovascular Disease

## 2022-06-14 NOTE — Progress Notes (Unsigned)
Cardiology Office Note:    Date:  06/15/2022   ID:  [name], DOB 06-Feb-1946, MRN [id number]  PCP:  Lawerance Cruel, MD  [facility] Cardiologist:  Reilly Molchan  [facility] Electrophysiologist:  None   Referring MD: Lawerance Cruel, MD   Chief Complaint  Patient presents with   Hyperlipidemia   Palpitations     Sept. 7, 2021   Theodore Lamb is a 76 y.o. male with a hx of  Palpitations and blurred vision. We were asked to see him today by Dr. Drema Dallas for further evaluation of his palpitations.   Has been having unpredictable spells for the past 3-4 weeks.  Occurs spontaneously,   Not preceded by anything in particular  May be associated with eating  - several have occurred 30 min after eating .  Works around his house.   Does not exercise per se.   Does not walk much  Is limited by back pain .  The spells will last 15-30 min.  Has mild episodes that start with mild mid sternal pressure,  A flutter sensation, mild heart burn,  And the sensation that he needs to take more frequent breaths . Som bad episodes cause some vision changes, nausea , hands and feet numbness.  Assoc with tightness in his throat .   Checks his Omron BP cuff and his VS are normal .   Eats and drinks regularly .  Not associated with syncope or presyncope  The symptoms dissapate over 1-2 minutes and then he feels well .   Has been seen by Dr. Wynonia Lawman in the past  Has worn a monitor and did an echo  Was told his echo was fine .   December 09, 2020:   [name] is seen today for follow up of his palpitations,  Moderate CAD , HLD   CCTA  in Oct. 2021 reveals : Coronary calcium score: The patient's coronary artery calcium score is 438, which places the patient in the 46 percentile. 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    December 11, 2021: [name] is seen for follow up of his palpitations, moderate CAD, HLD  Eats bacon regularly ,  might explain his BP elevation  Does not eat fast food  Wt is 247 lbs.    Sept. 15, 2023 [name] is seen for follow up of his palpitations, moderte CAD , HLD  Is vaping .   , not smoking    No CP ,  Palpitations  Is not exercising regularly  , tore his R knee meniscus Coronary CTA:  IMPRESSION: 1. Moderate CAD, CADRADS = 3. Given moderate disease, CT FFR will be performed and reported separately.   2. Coronary calcium score of 438. This was 31 percentile for age and sex matched control.  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   ezetimibe (ZETIA) 10 MG tablet TAKE ONE TABLET BY MOUTH ONCE DAILY   Glucosamine-Chondroitin (GLUCOSAMINE CHONDR COMPLEX PO) Take 1 tablet by mouth daily in the afternoon.   ibuprofen (ADVIL) 200 MG tablet Take 1 tablet by mouth every 6 (six)  hours as needed.   losartan (COZAAR) 100 MG tablet Take 1 tablet (100 mg total) by mouth daily in the afternoon.   Multiple Vitamin (MULTIVITAMIN) tablet Take 1 tablet by mouth daily.   Niacin (VITAMIN B-3 PO) Take 1 tablet by mouth daily in the afternoon.   rosuvastatin (CRESTOR) 20 MG tablet TAKE ONE TABLET EACH DAY   Zinc 50 MG TABS Take 1 tablet by mouth daily in the afternoon.     Allergies:   Bupropion, Candesartan cilexetil, and Leuprolide   Social History   Socioeconomic History   Marital status: Divorced    Spouse name: Not on file   Number of children: Not on file   Years of education: Not on file   Highest education level: Not on file  Occupational History   Not on file  Tobacco Use   Smoking status: Every Day   Smokeless tobacco: Current   Tobacco comments:    frequency  1 PPD . Smoking : smoking electric cigaretes . Alcohol : no     Substance and Sexual Activity   Alcohol use: No    Alcohol/week: 0.0 standard drinks of alcohol   Drug use: No   Sexual activity: Not on file  Other Topics Concern   Not on file  Social History Narrative   History of smoking cigarettes :Current smoker,   Frequency : 1 PPD Smoking: smoking electric  cigaretes. Alcohol: no.   Social Determinants of Health   Financial Resource Strain: Not on file  Food Insecurity: Not on file  Transportation Needs: Not on file  Physical Activity: Not on file  Stress: Not on file  Social Connections: Not on file     Family History: The patient's family history includes Colon cancer in an other family member; Testicular cancer in his paternal uncle.  ROS:   Please see the history of present illness.     All other systems reviewed and are negative.  EKGs/Labs/Other Studies Reviewed:    The following studies were reviewed today:      Recent Labs: 11/08/2021: ALT 22  Recent Lipid Panel    Component Value Date/Time   CHOL 135 11/08/2021 0935   TRIG 130 11/08/2021 0935   HDL 46  11/08/2021 0935   CHOLHDL 2.9 11/08/2021 0935   LDLCALC 66 11/08/2021 0935    Physical Exam:     Physical Exam: Blood pressure 138/76, pulse 71, height '5\' 8"'$  (  1.727 m), weight 254 lb 12.8 oz (115.6 kg), SpO2 93 %.       GEN:  Well nourished, well developed in no acute distress HEENT: Normal NECK: No JVD; No carotid bruits LYMPHATICS: No lymphadenopathy CARDIAC: RRR , no murmurs, rubs, gallops RESPIRATORY:  Clear to auscultation without rales, wheezing or rhonchi  ABDOMEN: Soft, non-tender, non-distended MUSCULOSKELETAL:  No edema; No deformity  SKIN: Warm and dry NEUROLOGIC:  Alert and oriented x 3   EKG:     ASSESSMENT:    1. Mixed hyperlipidemia   2. DOE (dyspnea on exertion)   3. Coronary artery disease involving native coronary artery of native heart without angina pectoris     PLAN:       1.  Hypertension: Blood pressure is well controlled.  2.  Hyperlipidemia:     . 3.  Shortness of breath with exertion.  He is having worsening shortness of breath with exertion.  He is very limited in what he is able to do because of some right knee pain.  I have encouraged him to see a sports medicine doctor or an orthopedist to see if he needs a knee brace.  I encouraged him to exercise either walking or stationary bike.  He needs more cardio exercise.  He also discussed weight loss.  We will get an echocardiogram to make sure that he does not have structural heart disease or valvular disease.  I will plan on seeing him in 3 weeks.   Medication Adjustments/Labs and Tests Ordered: Current medicines are reviewed at length with the patient today.  Concerns regarding medicines are outlined above.  Orders Placed This Encounter  Procedures   ECHOCARDIOGRAM COMPLETE   No orders of the defined types were placed in this encounter.     Patient Instructions  Medication Instructions:  Your physician recommends that you continue on your current medications as directed. Please  refer to the Current Medication list given to you today.  *If you need a refill on your cardiac medications before your next appointment, please call your pharmacy*  Lab Work: If you have labs (blood work) drawn today and your tests are completely normal, you will receive your results only by: [place] (if you have MyChart) OR A paper copy in the mail If you have any lab test that is abnormal or we need to change your treatment, we will call you to review the results.  Testing/Procedures: Your physician has requested that you have an echocardiogram. Echocardiography is a painless test that uses sound waves to create images of your heart. It provides your doctor with information about the size and shape of your heart and how well your heart's chambers and valves are working. This procedure takes approximately one hour. There are no restrictions for this procedure.  Follow-Up: At [facility], you and your health needs are our priority.  As part of our continuing mission to provide you with exceptional heart care, we have created designated Provider Care Teams.  These Care Teams include your primary Cardiologist (physician) and Advanced Practice Providers (APPs -  Physician Assistants and Nurse Practitioners) who all work together to provide you with the care you need, when you need it.  We recommend signing up for the patient portal called "MyChart".  Sign up information is provided on this After Visit Summary.  MyChart is used to connect with patients for Virtual Visits (Telemedicine).  Patients are able to view lab/test results, encounter notes, upcoming appointments, etc.  Non-urgent messages  can be sent to your provider as well.   To learn more about what you can do with MyChart, go to [email or web address].    Your next appointment:   3 month(s)  The format for your next appointment:   In Person  Provider:   Dr. Acie Fredrickson     Important Information About Sugar          Signed, Mertie Moores, MD  06/15/2022 6:06 PM    [place]

## 2022-06-15 ENCOUNTER — Encounter: Payer: Self-pay | Admitting: Cardiovascular Disease

## 2022-06-15 ENCOUNTER — Ambulatory Visit: Payer: Medicare HMO | Attending: Cardiovascular Disease | Admitting: Cardiovascular Disease

## 2022-06-15 VITALS — BP 138/76 | HR 71 | Ht 68.0 in | Wt 254.8 lb

## 2022-06-15 DIAGNOSIS — I2510 Atherosclerotic heart disease of native coronary artery without angina pectoris: Secondary | ICD-10-CM | POA: Insufficient documentation

## 2022-06-15 DIAGNOSIS — R0609 Other forms of dyspnea: Secondary | ICD-10-CM | POA: Diagnosis not present

## 2022-06-15 DIAGNOSIS — E782 Mixed hyperlipidemia: Secondary | ICD-10-CM

## 2022-06-15 NOTE — Patient Instructions (Addendum)
Medication Instructions:  Your physician recommends that you continue on your current medications as directed. Please refer to the Current Medication list given to you today.  *If you need a refill on your cardiac medications before your next appointment, please call your pharmacy*  Lab Work: If you have labs (blood work) drawn today and your tests are completely normal, you will receive your results only by: [place] (if you have MyChart) OR A paper copy in the mail If you have any lab test that is abnormal or we need to change your treatment, we will call you to review the results.  Testing/Procedures: Your physician has requested that you have an echocardiogram. Echocardiography is a painless test that uses sound waves to create images of your heart. It provides your doctor with information about the size and shape of your heart and how well your heart's chambers and valves are working. This procedure takes approximately one hour. There are no restrictions for this procedure.  Follow-Up: At [facility], you and your health needs are our priority.  As part of our continuing mission to provide you with exceptional heart care, we have created designated Provider Care Teams.  These Care Teams include your primary Cardiologist (physician) and Advanced Practice Providers (APPs -  Physician Assistants and Nurse Practitioners) who all work together to provide you with the care you need, when you need it.  We recommend signing up for the patient portal called "MyChart".  Sign up information is provided on this After Visit Summary.  MyChart is used to connect with patients for Virtual Visits (Telemedicine).  Patients are able to view lab/test results, encounter notes, upcoming appointments, etc.  Non-urgent messages can be sent to your provider as well.   To learn more about what you can do with MyChart, go to [email or web address].    Your next appointment:   3 month(s)  The  format for your next appointment:   In Person  Provider:   Dr. Acie Fredrickson     Important Information About Sugar

## 2022-06-20 DIAGNOSIS — H5010 Unspecified exotropia: Secondary | ICD-10-CM | POA: Diagnosis not present

## 2022-06-20 DIAGNOSIS — H43813 Vitreous degeneration, bilateral: Secondary | ICD-10-CM | POA: Diagnosis not present

## 2022-06-20 DIAGNOSIS — H04123 Dry eye syndrome of bilateral lacrimal glands: Secondary | ICD-10-CM | POA: Diagnosis not present

## 2022-06-20 DIAGNOSIS — D3131 Benign neoplasm of right choroid: Secondary | ICD-10-CM | POA: Diagnosis not present

## 2022-06-20 DIAGNOSIS — Z961 Presence of intraocular lens: Secondary | ICD-10-CM | POA: Diagnosis not present

## 2022-06-28 ENCOUNTER — Ambulatory Visit (HOSPITAL_COMMUNITY): Payer: Medicare HMO | Attending: Cardiovascular Disease

## 2022-06-28 DIAGNOSIS — I2510 Atherosclerotic heart disease of native coronary artery without angina pectoris: Secondary | ICD-10-CM | POA: Insufficient documentation

## 2022-06-28 DIAGNOSIS — R0609 Other forms of dyspnea: Secondary | ICD-10-CM | POA: Diagnosis not present

## 2022-06-28 DIAGNOSIS — E782 Mixed hyperlipidemia: Secondary | ICD-10-CM | POA: Insufficient documentation

## 2022-06-28 LAB — ECHOCARDIOGRAM COMPLETE
Area-P 1/2: 1.83 cm2
S' Lateral: 3.2 cm

## 2022-08-16 DIAGNOSIS — M5451 Vertebrogenic low back pain: Secondary | ICD-10-CM | POA: Diagnosis not present

## 2022-08-16 DIAGNOSIS — M1711 Unilateral primary osteoarthritis, right knee: Secondary | ICD-10-CM | POA: Diagnosis not present

## 2022-08-20 DIAGNOSIS — N50819 Testicular pain, unspecified: Secondary | ICD-10-CM | POA: Diagnosis not present

## 2022-08-20 DIAGNOSIS — R350 Frequency of micturition: Secondary | ICD-10-CM | POA: Diagnosis not present

## 2022-08-20 DIAGNOSIS — Z6838 Body mass index (BMI) 38.0-38.9, adult: Secondary | ICD-10-CM | POA: Diagnosis not present

## 2022-09-02 NOTE — Progress Notes (Unsigned)
Cardiology Office Note:    Date:  09/03/2022   ID:  Theodore Lamb, DOB [birth year], MRN [id number]  PCP:  Theodore Cruel, MD  [facility] Cardiologist:  Theodore Lamb  [facility] Electrophysiologist:  None   Referring MD: Theodore Cruel, MD   Chief Complaint  Patient presents with   Palpitations   Hyperlipidemia     Sept. 7, 2021   Theodore Lamb is a 76 y.o. male with a hx of  Palpitations and blurred vision. We were asked to see him today by Dr. Drema Lamb for further evaluation of his palpitations.   Has been having unpredictable spells for the past 3-4 weeks.  Occurs spontaneously,   Not preceded by anything in particular  May be associated with eating  - several have occurred 30 min after eating .  Works around his house.   Does not exercise per se.   Does not walk much  Is limited by back pain .  The spells will last 15-30 min.  Has mild episodes that start with mild mid sternal pressure,  A flutter sensation, mild heart burn,  And the sensation that he needs to take more frequent breaths . Som bad episodes cause some vision changes, nausea , hands and feet numbness.  Assoc with tightness in his throat .   Checks his Omron BP cuff and his VS are normal .   Eats and drinks regularly .  Not associated with syncope or presyncope  The symptoms dissapate over 1-2 minutes and then he feels well .   Has been seen by Dr. Wynonia Lawman in the past  Has worn a monitor and did an echo  Was told his echo was fine .   December 09, 2020:   Theodore Lamb is seen today for follow up of his palpitations,  Moderate CAD , HLD   CCTA  in Oct. 2021 reveals : Coronary calcium score: The patient's coronary artery calcium score is 438, which places the patient in the 46 percentile. 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    December 11, 2021: Theodore Lamb is seen for follow up of his palpitations, moderate CAD, HLD  Eats bacon regularly ,  might explain his BP elevation  Does not eat fast food  Wt is 247 lbs.    Sept. 15, 2023 Theodore Lamb is seen for follow up of his palpitations, moderte CAD , HLD  Is vaping .   , not smoking    No CP ,  Palpitations  Is not exercising regularly  , tore his R knee meniscus Coronary CTA:  IMPRESSION: 1. Moderate CAD, CADRADS = 3. Given moderate disease, CT FFR will be performed and reported separately.   2. Coronary calcium score of 438. This was 44 percentile for age and sex matched control.  I suspect he has some deconditioning   Advised more exercise Visit with ortho or sports medicine dr to work on knee pain    Dec. 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   Cervical spondylosis    Depression    FH: alcoholism    H/O leukocytosis    leukocytosis and erythrocytosis   Nuclear sclerotic cataract of both eyes 10/31/2020   Prostate cancer ([place])    S/P radiation therapy 02/23/2015 through 04/21/2015     Prostate 7800 cGy in 40 sessions, seminal vesicles, and pelvic lymph nodes 5600 cGy in 40 sessions                         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   ezetimibe (ZETIA) 10 MG tablet TAKE ONE TABLET BY MOUTH ONCE DAILY   Glucosamine-Chondroitin (GLUCOSAMINE CHONDR COMPLEX PO) Take 1 tablet by mouth daily in the afternoon.   ibuprofen (ADVIL) 200 MG tablet Take 1 tablet by mouth every 6 (six) hours as needed.   losartan (COZAAR) 100 MG tablet Take 1 tablet (100 mg total) by mouth daily in the afternoon.   Multiple Vitamin (MULTIVITAMIN) tablet Take 1 tablet by mouth daily.   Niacin (VITAMIN B-3 PO) Take 1 tablet by mouth daily in the afternoon.   rosuvastatin (CRESTOR) 20 MG tablet TAKE ONE TABLET EACH DAY   Zinc 50 MG TABS Take 1 tablet by mouth daily in the afternoon.     Allergies:   Bupropion, Candesartan cilexetil, and Leuprolide   Social History   Socioeconomic History   Marital status: Divorced    Spouse name: Not on file   Number of children: Not on file   Years of education: Not on file   Highest education level: Not on file  Occupational History   Not on file  Tobacco Use   Smoking status: Every Day   Smokeless tobacco: Current   Tobacco comments:    frequency  1 PPD . Smoking : smoking electric cigaretes . Alcohol : no     Substance and Sexual Activity   Alcohol use: No    Alcohol/week: 0.0 standard drinks of alcohol   Drug use: No   Sexual activity: Not on file  Other Topics Concern   Not on file  Social History Narrative   History of smoking cigarettes :Current smoker,   Frequency : 1 PPD Smoking: smoking electric  cigaretes. Alcohol: no.   Social Determinants of Health   Financial Resource Strain: Not on file  Food Insecurity: Not on file  Transportation Needs: Not on file  Physical Activity: Not on file  Stress: Not on file  Social Connections: Not on file     Family History: The patient's family history includes Colon cancer in an other family member; Testicular cancer in his paternal uncle.  ROS:   Please see the history of present illness.     All other  systems reviewed and are negative.  EKGs/Labs/Other Studies Reviewed:    The following studies were reviewed today:      Recent Labs: 11/08/2021: ALT 22  Recent Lipid Panel    Component Value Date/Time   CHOL 135 11/08/2021 0935   TRIG 130 11/08/2021 0935   HDL 46 11/08/2021 0935   CHOLHDL 2.9 11/08/2021 0935   LDLCALC 66 11/08/2021 0935    Physical Exam:     Physical Exam: Blood pressure 132/78, pulse 64, height '5\' 8"'$  (1.727 m), weight 246 lb 6.4 oz (111.8 kg), SpO2 93 %.       GEN:  moderately obese, male  HEENT: Normal NECK: No JVD; No carotid bruits LYMPHATICS: No lymphadenopathy CARDIAC: RRR , no murmurs, rubs, gallops RESPIRATORY:  Clear to auscultation without rales, wheezing or rhonchi  ABDOMEN: Soft, non-tender, non-distended MUSCULOSKELETAL:  No edema; No deformity  SKIN: Warm and dry NEUROLOGIC:  Alert and oriented x 3   EKG:     ASSESSMENT:    1. DOE (dyspnea on exertion)   2. Hyperlipidemia LDL goal <70   3. Coronary artery disease involving native coronary artery of native heart without angina pectoris   4. Daytime somnolence      PLAN:       1.  Hypertension: Blood pressure is well controlled.  2.  Hyperlipidemia:     . 3.  Shortness of breath with exertion.   Has been having worsening shortness of breath.  Echocardiogram does not reveal any structural abnormalities.  Coronary CT angiogram from several years ago reveals mild to moderate CAD.  He is not having any episodes of angina.  I have suggested that he continue to work on weight loss.  We discussed smoking cessation in the past.  4.  Daytime sleepiness.  He snores.  Has had some daytime sleepiness.  He is moderately obese.  I suspect that there is a good chance he has obstructive sleep apnea.  Will screen him for the home sleep study.      Medication Adjustments/Labs and Tests Ordered: Current medicines are reviewed at length with the patient today.  Concerns regarding  medicines are outlined above.  Orders Placed This Encounter  Procedures   Lipid panel   ALT   Basic metabolic panel   [facility] Sleep Study   No orders of the defined types were placed in this encounter.     Patient Instructions  Medication Instructions:  Your physician recommends that you continue on your current medications as directed. Please refer to the Current Medication list given to you today.  *If you need a refill on your cardiac medications before your next appointment, please call your pharmacy*  Lab Work: Lipids, ALT, BMET today If you have labs (blood work) drawn today and your tests are completely normal, you will receive your results only by: [place] (if you have MyChart) OR A paper copy in the mail If you have any lab test that is abnormal or we need to change your treatment, we will call you to review the results.  Testing/Procedures: [facility] sleep study Your physician has recommended that you have a sleep study. This test records several body functions during sleep, including: brain activity, eye movement, oxygen and carbon dioxide blood levels, heart rate and rhythm, breathing rate and rhythm, the flow of air through your mouth and nose, snoring, body muscle movements, and chest and belly movement.  Follow-Up: At [facility], you and your health needs are our priority.  As part of our continuing mission to provide you with exceptional heart care, we have created designated Provider Care Teams.  These Care Teams include your primary Cardiologist (physician) and  Advanced Practice Providers (APPs -  Physician Assistants and Nurse Practitioners) who all work together to provide you with the care you need, when you need it.  Your next appointment:   1 year(s)  The format for your next appointment:   In Person  Provider:   Mertie Moores, MD       Important Information About Sugar         Signed, Mertie Moores, MD  09/03/2022 5:06 PM     [place]

## 2022-09-03 ENCOUNTER — Encounter: Payer: Self-pay | Admitting: Cardiovascular Disease

## 2022-09-03 ENCOUNTER — Ambulatory Visit: Payer: Medicare HMO | Attending: Cardiovascular Disease | Admitting: Cardiovascular Disease

## 2022-09-03 ENCOUNTER — Telehealth: Payer: Self-pay | Admitting: *Deleted

## 2022-09-03 VITALS — BP 132/78 | HR 64 | Ht 68.0 in | Wt 246.4 lb

## 2022-09-03 DIAGNOSIS — R0609 Other forms of dyspnea: Secondary | ICD-10-CM

## 2022-09-03 DIAGNOSIS — I2510 Atherosclerotic heart disease of native coronary artery without angina pectoris: Secondary | ICD-10-CM

## 2022-09-03 DIAGNOSIS — R400 Somnolence: Secondary | ICD-10-CM | POA: Diagnosis not present

## 2022-09-03 DIAGNOSIS — E785 Hyperlipidemia, unspecified: Secondary | ICD-10-CM

## 2022-09-03 NOTE — Telephone Encounter (Signed)
Pt was seen in the office today by Dr. Cathie Olden who ordered an [facility] study. Pt agreeable to signed waiver and not open the box until he has been called with a PIN#.

## 2022-09-03 NOTE — Patient Instructions (Signed)
Medication Instructions:  Your physician recommends that you continue on your current medications as directed. Please refer to the Current Medication list given to you today.  *If you need a refill on your cardiac medications before your next appointment, please call your pharmacy*  Lab Work: Lipids, ALT, BMET today If you have labs (blood work) drawn today and your tests are completely normal, you will receive your results only by: [place] (if you have MyChart) OR A paper copy in the mail If you have any lab test that is abnormal or we need to change your treatment, we will call you to review the results.  Testing/Procedures: [facility] sleep study Your physician has recommended that you have a sleep study. This test records several body functions during sleep, including: brain activity, eye movement, oxygen and carbon dioxide blood levels, heart rate and rhythm, breathing rate and rhythm, the flow of air through your mouth and nose, snoring, body muscle movements, and chest and belly movement.  Follow-Up: At [facility], you and your health needs are our priority.  As part of our continuing mission to provide you with exceptional heart care, we have created designated Provider Care Teams.  These Care Teams include your primary Cardiologist (physician) and Advanced Practice Providers (APPs -  Physician Assistants and Nurse Practitioners) who all work together to provide you with the care you need, when you need it.  Your next appointment:   1 year(s)  The format for your next appointment:   In Person  Provider:   Mertie Moores, MD       Important Information About Sugar

## 2022-09-04 LAB — BASIC METABOLIC PANEL
BUN/Creatinine Ratio: 17 (ref 10–24)
BUN: 23 mg/dL (ref 8–27)
CO2: 27 mmol/L (ref 20–29)
Calcium: 9.7 mg/dL (ref 8.6–10.2)
Chloride: 102 mmol/L (ref 96–106)
Creatinine, Ser: 1.36 mg/dL — ABNORMAL HIGH (ref 0.76–1.27)
Glucose: 93 mg/dL (ref 70–99)
Potassium: 5 mmol/L (ref 3.5–5.2)
Sodium: 141 mmol/L (ref 134–144)
eGFR: 54 mL/min/{1.73_m2} — ABNORMAL LOW (ref >59)

## 2022-09-04 LAB — LIPID PANEL
Chol/HDL Ratio: 2.8 ratio (ref 0.0–5.0)
Cholesterol, Total: 133 mg/dL (ref 100–199)
HDL: 48 mg/dL (ref >39)
LDL Chol Calc (NIH): 66 mg/dL (ref 0–99)
Triglycerides: 105 mg/dL (ref 0–149)
VLDL Cholesterol Cal: 19 mg/dL (ref 5–40)

## 2022-09-04 LAB — ALT: ALT: 24 IU/L (ref 0–44)

## 2022-09-05 NOTE — Telephone Encounter (Signed)
Prior Authorization for [facility] sent to [facility] via web portal. Tracking Number do not require Pre-Authorization by [employer].

## 2022-09-05 NOTE — Telephone Encounter (Signed)
Called and made the patient aware that he may proceed with the [facility] Sleep Study. PIN # provided to the patient. Patient made aware that he will be contacted after the test has been read with the results and any recommendations. Patient verbalized understanding and thanked me for the call.    Pt has been given PIN # [id number]. Pt will do sleep study one night this week. Pt thanked me for the call today.

## 2022-09-09 ENCOUNTER — Encounter (HOSPITAL_BASED_OUTPATIENT_CLINIC_OR_DEPARTMENT_OTHER): Payer: Medicare HMO | Admitting: Cardiology

## 2022-09-09 DIAGNOSIS — G4733 Obstructive sleep apnea (adult) (pediatric): Secondary | ICD-10-CM | POA: Diagnosis not present

## 2022-09-16 NOTE — Procedures (Signed)
SLEEP STUDY REPORT Patient Information Study Date: 09/09/2022 Patient Name: Theodore Lamb Patient ID: [id number] Birth Date: 1946-03-22 Age: 76 Gender: Male BMI: 37.4 (W=247 lb, H=5' 8'') Stopbang: 8 Referring Physician: Mertie Moores, MD  TEST DESCRIPTION: Home sleep apnea testing was completed using the WatchPat, a Type 1 device, utilizing peripheral arterial tonometry (PAT), chest movement, actigraphy, pulse oximetry, pulse rate, body position and snore.  AHI was calculated with apnea and hypopnea using valid sleep time as the denominator. RDI includes apneas, hypopneas, and RERAs.  The data acquired and the scoring of sleep and all associated events were performed in accordance with the recommended standards and specifications as outlined in the AASM Manual for the Scoring of Sleep and Associated Events 2.2.0 (2015).  FINDINGS:  1.  Severe Obstructive Sleep Apnea with AHI 49/hr.   2.  No significant Central Sleep Apnea with pAHIc 7.2/hr.  3.  Oxygen desaturations as low as 69%.  4.  Severe snoring was present. O2 sats were < 88% for 175 min.  5.  Total sleep time was 7 hrs and 15 min.  6.  21.2% of total sleep time was spent in REM sleep.   7.  Normal sleep onset latency at 19 min.   8.  Prolonged REM sleep onset latency at 109 min.   9.  Total awakenings were 6.  10. Arrhythmia detection:  Suggestive of possible brief atrial fibrillation lasting 38 seconds.  This is not diagnostic and further testing with outpatient telemetry monitoring is recommended.  DIAGNOSIS:   Severe Obstructive Sleep Apnea (G47.33) Nocturnal Hypoxemia Possible Atrial Fibrillation  RECOMMENDATIONS:   1.  Clinical correlation of these findings is necessary.  The decision to treat obstructive sleep apnea (OSA) is usually based on the presence of apnea symptoms or the presence of associated medical conditions such as Hypertension, Congestive Heart Failure, Atrial Fibrillation or Obesity.  The most  common symptoms of OSA are snoring, gasping for breath while sleeping, daytime sleepiness and fatigue.   2.  Initiating apnea therapy is recommended given the presence of symptoms and/or associated conditions. Recommend proceeding with one of the following:     a.  Auto-CPAP therapy with a pressure range of 5-20cm H2O.     b.  An oral appliance (OA) that can be obtained from certain dentists with expertise in sleep medicine.  These are primarily of use in non-obese patients with mild and moderate disease.     c.  An ENT consultation which may be useful to look for specific causes of obstruction and possible treatment options.     d.  If patient is intolerant to PAP therapy, consider referral to ENT for evaluation for hypoglossal nerve stimulator.   3.  Close follow-up is necessary to ensure success with CPAP or oral appliance therapy for maximum benefit.  4.  A follow-up oximetry study on CPAP is recommended to assess the adequacy of therapy and determine the need for supplemental oxygen or the potential need for Bi-level therapy.  An arterial blood gas to determine the adequacy of baseline ventilation and oxygenation should also be considered.  5.  Healthy sleep recommendations include:  adequate nightly sleep (normal 7-9 hrs/night), avoidance of caffeine after noon and alcohol near bedtime, and maintaining a sleep environment that is cool, dark and quiet.  6.  Weight loss for overweight patients is recommended.  Even modest amounts of weight loss can significantly improve the severity of sleep apnea.  7.  Snoring recommendations include:  weight  loss where appropriate, side sleeping, and avoidance of alcohol before bed.  8.  Operation of motor vehicle should be avoided when sleepy.  9.  Consider outpatient event monitor to assess for silent atrial fibrillation if clinically indicated  Signature: Fransico Him, MD; [facility]; [place], [place] Board of Sleep Medicine Electronically Signed:  09/17/2022

## 2022-09-17 ENCOUNTER — Ambulatory Visit: Payer: Medicare HMO | Attending: Cardiovascular Disease

## 2022-09-17 DIAGNOSIS — R400 Somnolence: Secondary | ICD-10-CM

## 2022-09-17 DIAGNOSIS — R0609 Other forms of dyspnea: Secondary | ICD-10-CM

## 2022-09-28 ENCOUNTER — Telehealth: Payer: Self-pay | Admitting: *Deleted

## 2022-09-28 DIAGNOSIS — G4733 Obstructive sleep apnea (adult) (pediatric): Secondary | ICD-10-CM

## 2022-09-28 DIAGNOSIS — R400 Somnolence: Secondary | ICD-10-CM

## 2022-09-28 NOTE — Telephone Encounter (Signed)
The patient has been notified of the result and verbalized understanding.  All questions (if any) were answered. [name], [place] 09/28/2022 6:37 PM    Pt is aware and agreeable to results.will precert titration.

## 2022-09-28 NOTE — Telephone Encounter (Signed)
-----   Message from Lauralee Evener, [place] sent at 09/17/2022  8:05 AM EST -----  ----- Message ----- From: Sueanne Margarita, MD Sent: 09/16/2022  10:08 PM EST To: Cv Div Sleep Studies  Please let patient know that they have sleep apnea.  Recommend therapeutic CPAP titration for treatment of patient's sleep disordered breathing.  If unable to perform an in lab titration then initiate ResMed auto CPAP from 4 to 15cm H2O with heated humidity and mask of choice and overnight pulse ox on CPAP.

## 2022-10-11 DIAGNOSIS — N529 Male erectile dysfunction, unspecified: Secondary | ICD-10-CM | POA: Diagnosis not present

## 2022-10-11 DIAGNOSIS — C61 Malignant neoplasm of prostate: Secondary | ICD-10-CM | POA: Diagnosis not present

## 2022-10-31 DIAGNOSIS — M25551 Pain in right hip: Secondary | ICD-10-CM | POA: Diagnosis not present

## 2022-10-31 DIAGNOSIS — M25552 Pain in left hip: Secondary | ICD-10-CM | POA: Diagnosis not present

## 2022-10-31 DIAGNOSIS — M25562 Pain in left knee: Secondary | ICD-10-CM | POA: Diagnosis not present

## 2022-10-31 DIAGNOSIS — M25561 Pain in right knee: Secondary | ICD-10-CM | POA: Diagnosis not present

## 2022-11-07 ENCOUNTER — Ambulatory Visit (HOSPITAL_BASED_OUTPATIENT_CLINIC_OR_DEPARTMENT_OTHER): Payer: Medicare HMO | Attending: Cardiology | Admitting: Cardiology

## 2022-11-07 VITALS — Ht 68.0 in | Wt 240.0 lb

## 2022-11-07 DIAGNOSIS — R400 Somnolence: Secondary | ICD-10-CM | POA: Insufficient documentation

## 2022-11-07 DIAGNOSIS — G4733 Obstructive sleep apnea (adult) (pediatric): Secondary | ICD-10-CM | POA: Diagnosis not present

## 2022-11-12 NOTE — Procedures (Signed)
   Patient Name: Theodore Lamb, Theodore Lamb Date: 11/07/2022 Gender: Male D.O.B: January 21, 1946 Age (years): 51 Referring Provider: Fransico Him MD, ABSM Height (inches): 68 Interpreting Physician: Fransico Him MD, ABSM Weight (lbs): 240 RPSGT: Jorge Ny BMI: 36 MRN: [id number] Neck Size: 17.00  CLINICAL INFORMATION The patient is referred for a BiPAP titration to treat sleep apnea.  SLEEP STUDY TECHNIQUE As per the AASM Manual for the Scoring of Sleep and Associated Events v2.3 (April 2016) with a hypopnea requiring 4% desaturations.  The channels recorded and monitored were frontal, central and occipital EEG, electrooculogram (EOG), submentalis EMG (chin), nasal and oral airflow, thoracic and abdominal wall motion, anterior tibialis EMG, snore microphone, electrocardiogram, and pulse oximetry. Bilevel positive airway pressure (BPAP) was initiated at the beginning of the study and titrated to treat sleep-disordered breathing.  MEDICATIONS Medications self-administered by patient taken the night of the study : N/A  RESPIRATORY PARAMETERS Optimal IPAP Pressure (cm): N/A  AHI at Optimal Pressure (/hr) N/A Optimal EPAP Pressure (cm):N/A  Overall Minimal O2 (%):79.0 Minimal O2 at Optimal Pressure (%): 79.0  SLEEP ARCHITECTURE Start Time:9:45:49 PM  Stop Time:4:45:47 AM  Total Time (min):420  Total Sleep Time (min)354 Sleep Latency (min):13.7  Sleep Efficiency (%):84.3%  REM Latency (min):75.0  WASO (min):52.2 Stage N1 (%):6.8%  Stage N2 (%):59.7%  Stage N3 (%):0.0%  Stage R (%):33.5 Supine (%):78.53  Arousal Index (/hr):19.0   CARDIAC DATA The 2 lead EKG demonstrated sinus rhythm. The mean heart rate was 52.3 beats per minute. Other EKG findings include: PVCs.  LEG MOVEMENT DATA The total Periodic Limb Movements of Sleep (PLMS) were 0. The PLMS index was 0.0. A PLMS index of <15 is considered normal in adults.  IMPRESSIONS - An optimal PAP pressure could not be  selected for this patient based on the available study data. - Mild Central Sleep Apnea was noted during this titration (CAI = 5.3/h). - Severe oxygen desaturations were observed during this titration (min O2 = 79.0%). - The patient snored with moderate snoring volume. - 2-lead EKG demonstrated: PVCs - Clinically significant periodic limb movements were not noted during this study. Arousals associated with PLMs were rare.  DIAGNOSIS - Obstructive Sleep Apnea (G47.33) - Nocturnal Hypoxemia  RECOMMENDATIONS - Recommend a trial of ResMed auto BiPAP with IPAP max 20cm H2O, EPAP min 5cm H2O and PS 4cm H2O with heated humidity and mask of choice. - Avoid alcohol, sedatives and other CNS depressants that may worsen sleep apnea and disrupt normal sleep architecture. - Sleep hygiene should be reviewed to assess factors that may improve sleep quality. - Weight management and regular exercise should be initiated or continued. - Return to Sleep Center for re-evaluation after 4 weeks of therapy - Recommend overnight pulse oximetry on auto BiPAP to assess for residual nocturnal hypoxemia  [Electronically signed] 11/12/2022 08:30 AM  Fransico Him MD, ABSM Diplomate, American Board of Sleep Medicine

## 2022-11-14 ENCOUNTER — Telehealth: Payer: Self-pay | Admitting: Cardiology

## 2022-11-14 NOTE — Telephone Encounter (Signed)
Patient is calling wanting an update on where he is at with getting the CPAP machine.

## 2022-11-15 ENCOUNTER — Telehealth: Payer: Self-pay | Admitting: *Deleted

## 2022-11-15 DIAGNOSIS — G4733 Obstructive sleep apnea (adult) (pediatric): Secondary | ICD-10-CM

## 2022-11-15 DIAGNOSIS — I2510 Atherosclerotic heart disease of native coronary artery without angina pectoris: Secondary | ICD-10-CM

## 2022-11-15 NOTE — Telephone Encounter (Signed)
The patient has been notified of the result and verbalized understanding.  All questions (if any) were answered. [name], [place] 11/15/2022 12:17 PM

## 2022-11-15 NOTE — Telephone Encounter (Signed)
-----   Message from Lauralee Evener, [place] sent at 11/12/2022 11:49 AM EST -----  ----- Message ----- From: Sueanne Margarita, MD Sent: 11/12/2022   8:32 AM EST To: Cv Div Sleep Studies  Please let patient know that they had a successful PAP titration and let DME know that orders are in EPIC.  Please set up 6 week OV with me. Needs ONO on BiPAP

## 2022-11-15 NOTE — Telephone Encounter (Signed)
The patient has been notified of the result and verbalized understanding.  All questions (if any) were answered. [name], [place] 11/15/2022 12:17 PM    Upon patient request DME selection is [facility]. Patient understands he will be contacted by [facility] to set up his cpap. Patient understands to call if [facility] does not contact him with new setup in a timely manner. Patient understands they will be called once confirmation has been received from [employer]/ that they have received their new machine to schedule 10 week follow up appointment.   [facility] notified of new cpap order  Please add to airview Patient was grateful for the call and thanked me.

## 2022-11-16 DIAGNOSIS — M6281 Muscle weakness (generalized): Secondary | ICD-10-CM | POA: Diagnosis not present

## 2022-11-16 DIAGNOSIS — R262 Difficulty in walking, not elsewhere classified: Secondary | ICD-10-CM | POA: Diagnosis not present

## 2022-11-16 DIAGNOSIS — S39012D Strain of muscle, fascia and tendon of lower back, subsequent encounter: Secondary | ICD-10-CM | POA: Diagnosis not present

## 2022-11-19 NOTE — Telephone Encounter (Signed)
Pt is calling to get update on CPAP machine and if it has been order as of yet. He states he received an approval letter, but no further details and would like a call back.

## 2022-11-20 ENCOUNTER — Telehealth: Payer: Self-pay | Admitting: Cardiology

## 2022-11-20 NOTE — Telephone Encounter (Signed)
Patient called about his CPAP, he said he called [employer] and they haven't received a prior-auth request for a cpap machine.  He was told by [facility] prior auth doesn't take 15 days, approval happens right away.  He wants to know what is going on. He said he had a pretty rough night sleep wise and would like to get his cpap machine up and running as soon as he can.

## 2022-11-21 NOTE — Telephone Encounter (Signed)
Message sent via Mychart to contact [facility] at [phone] for his cpap status.

## 2022-11-21 NOTE — Telephone Encounter (Signed)
[facility] message sent to patient with contact information.

## 2022-11-23 DIAGNOSIS — M6281 Muscle weakness (generalized): Secondary | ICD-10-CM | POA: Diagnosis not present

## 2022-11-23 DIAGNOSIS — S39012D Strain of muscle, fascia and tendon of lower back, subsequent encounter: Secondary | ICD-10-CM | POA: Diagnosis not present

## 2022-11-23 DIAGNOSIS — R262 Difficulty in walking, not elsewhere classified: Secondary | ICD-10-CM | POA: Diagnosis not present

## 2022-11-26 DIAGNOSIS — S39012D Strain of muscle, fascia and tendon of lower back, subsequent encounter: Secondary | ICD-10-CM | POA: Diagnosis not present

## 2022-11-26 DIAGNOSIS — R262 Difficulty in walking, not elsewhere classified: Secondary | ICD-10-CM | POA: Diagnosis not present

## 2022-11-26 DIAGNOSIS — M6281 Muscle weakness (generalized): Secondary | ICD-10-CM | POA: Diagnosis not present

## 2022-11-30 DIAGNOSIS — R262 Difficulty in walking, not elsewhere classified: Secondary | ICD-10-CM | POA: Diagnosis not present

## 2022-11-30 DIAGNOSIS — S39012D Strain of muscle, fascia and tendon of lower back, subsequent encounter: Secondary | ICD-10-CM | POA: Diagnosis not present

## 2022-11-30 DIAGNOSIS — M6281 Muscle weakness (generalized): Secondary | ICD-10-CM | POA: Diagnosis not present

## 2022-12-03 DIAGNOSIS — S39012D Strain of muscle, fascia and tendon of lower back, subsequent encounter: Secondary | ICD-10-CM | POA: Diagnosis not present

## 2022-12-03 DIAGNOSIS — M6281 Muscle weakness (generalized): Secondary | ICD-10-CM | POA: Diagnosis not present

## 2022-12-03 DIAGNOSIS — R262 Difficulty in walking, not elsewhere classified: Secondary | ICD-10-CM | POA: Diagnosis not present

## 2022-12-07 DIAGNOSIS — S39012D Strain of muscle, fascia and tendon of lower back, subsequent encounter: Secondary | ICD-10-CM | POA: Diagnosis not present

## 2022-12-07 DIAGNOSIS — M6281 Muscle weakness (generalized): Secondary | ICD-10-CM | POA: Diagnosis not present

## 2022-12-07 DIAGNOSIS — R262 Difficulty in walking, not elsewhere classified: Secondary | ICD-10-CM | POA: Diagnosis not present

## 2022-12-10 IMAGING — MR MR KNEE*R* W/O CM
4 of 6 series · 18 of 40 positions shown · non-contrast
Comparison: None.

CLINICAL DATA: Right knee pain and swelling.  Injury 07/22/2021

EXAM:
MRI OF THE RIGHT KNEE WITHOUT CONTRAST
TECHNIQUE: Multiplanar, multisequence MR imaging of the knee was performed. No
intravenous contrast was administered.

[Series 3: T2 fat-sat · axial · 4.0mm · 0.35mm/px · z∈[-37,+64]mm · 3 of 35 slices shown (1 of 2)]
[im 6/35]
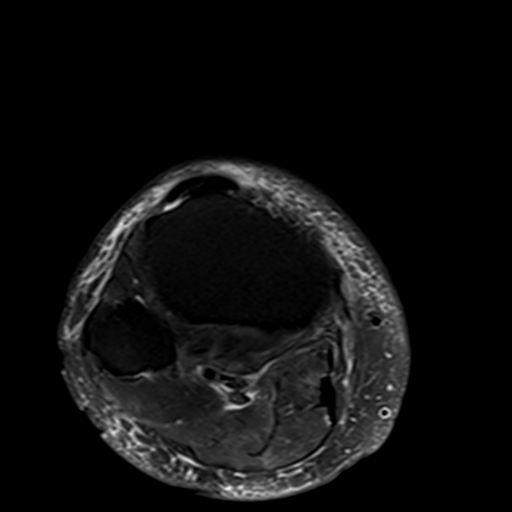
[im 18/35]
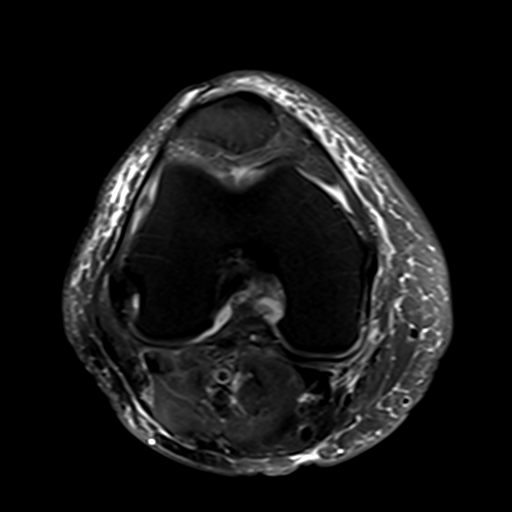
[im 29/35]
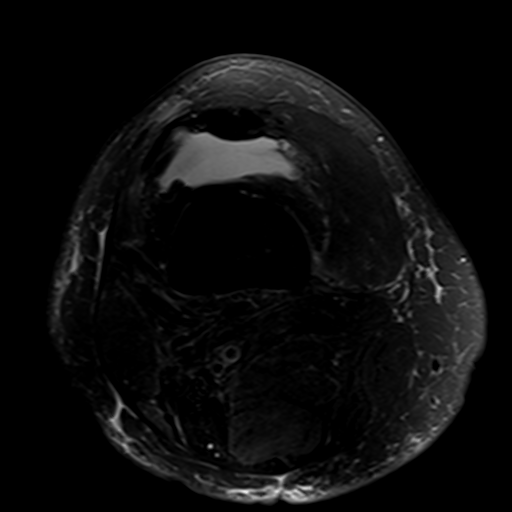

[Series 5: T2 fat-sat · coronal · 4.0mm · 0.33mm/px · 3 of 35 slices shown (2 of 2)]
[im 7/35]
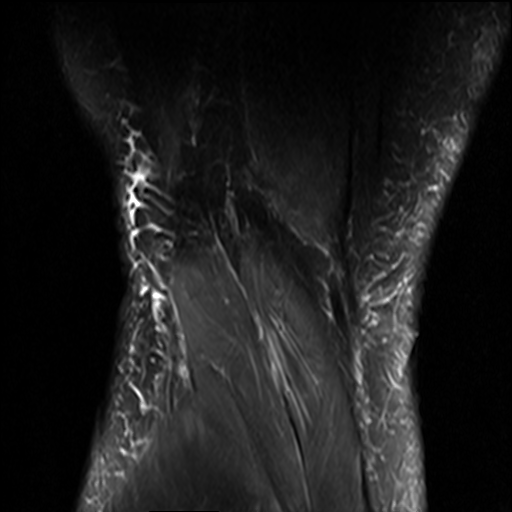
[im 21/35]
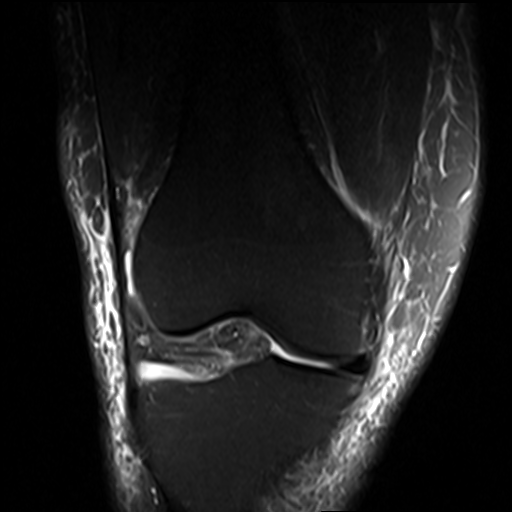
[im 35/35]
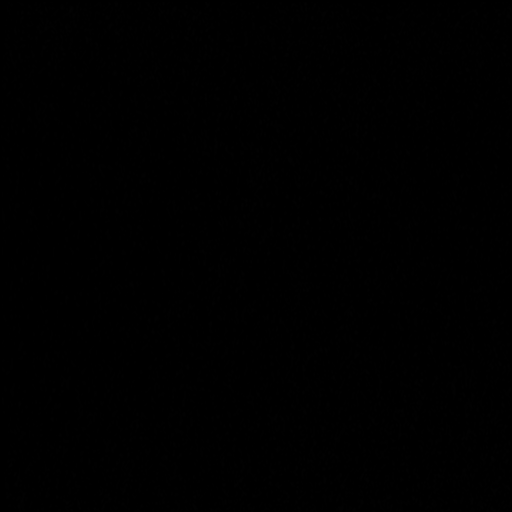

[Series 6: PD fat-sat · coronal · 3.0mm · 0.33mm/px · 7 of 39 slices shown (1 of 2)]
[im 1/39]
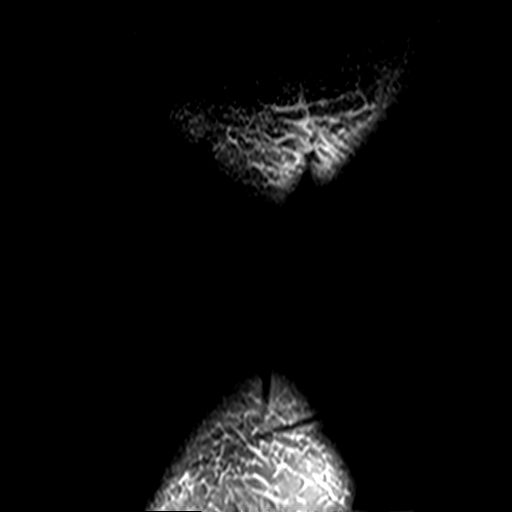
[im 7/39]
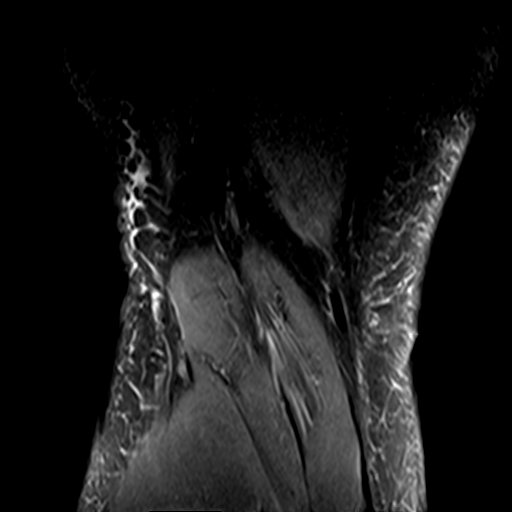
[im 13/39]
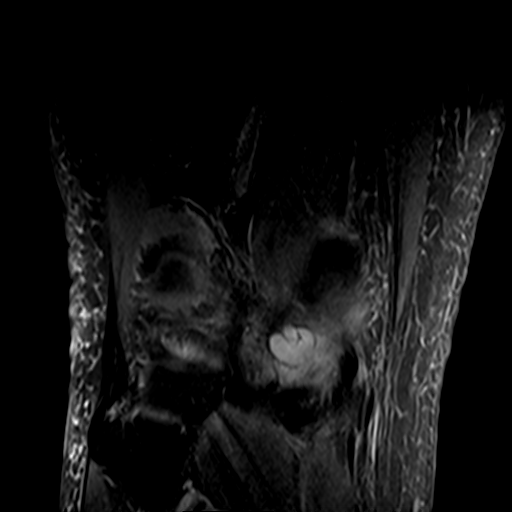
[im 20/39]
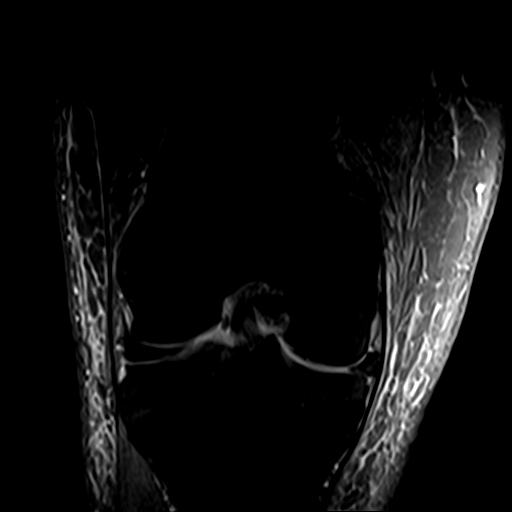
[im 26/39]
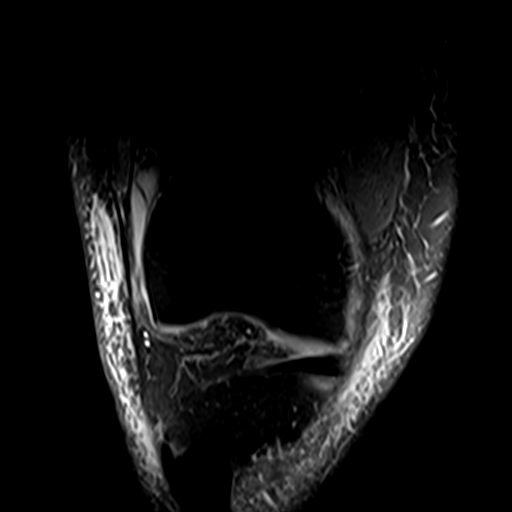
[im 32/39]
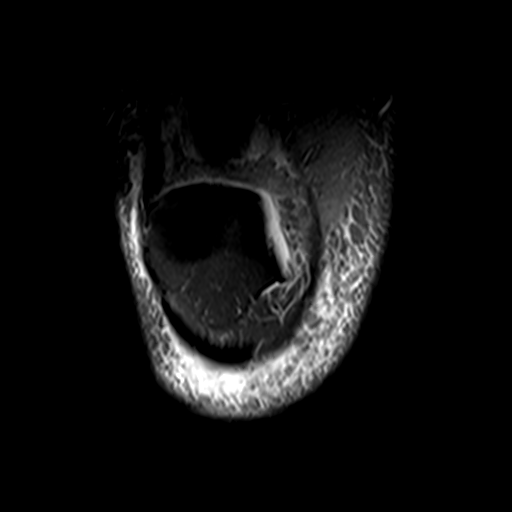
[im 39/39]
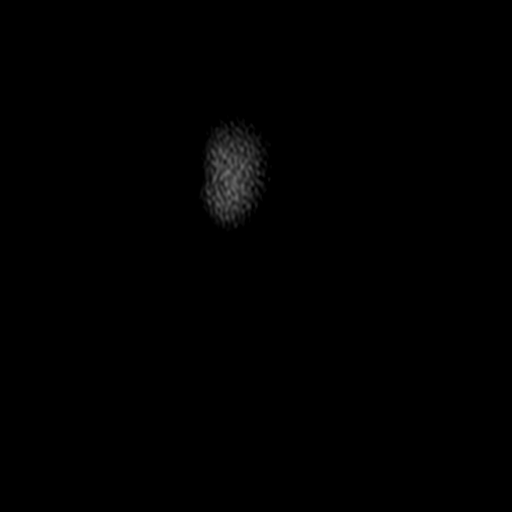

[Series 7: PD fat-sat · sagittal · 3.0mm · 0.31mm/px · 5 of 36 slices shown (2 of 2)]
[im 1/36]
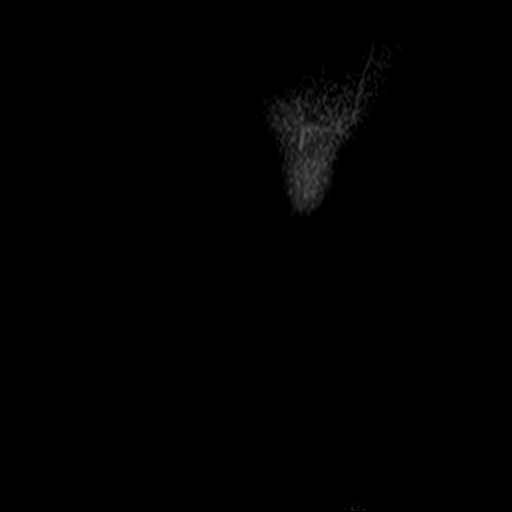
[im 6/36]
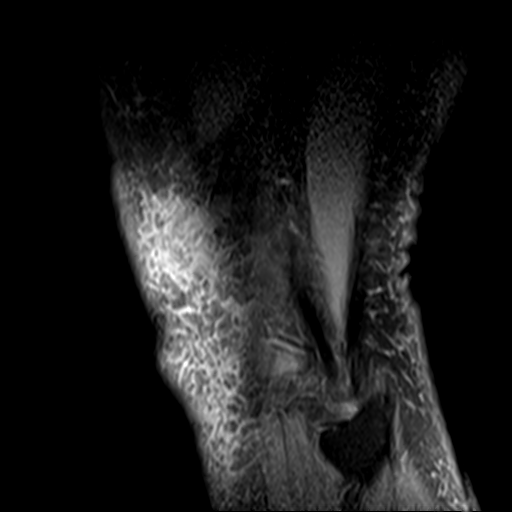
[im 12/36]
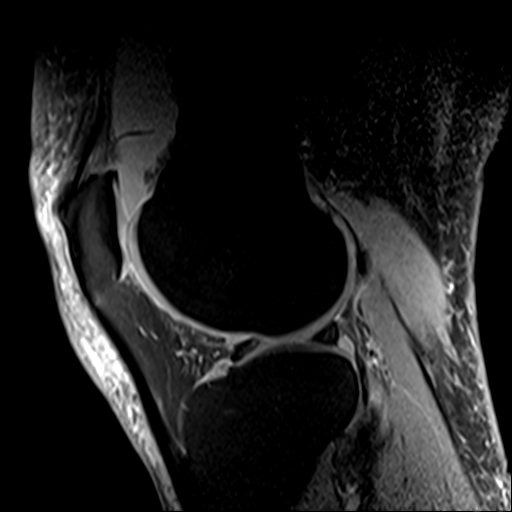
[im 18/36]
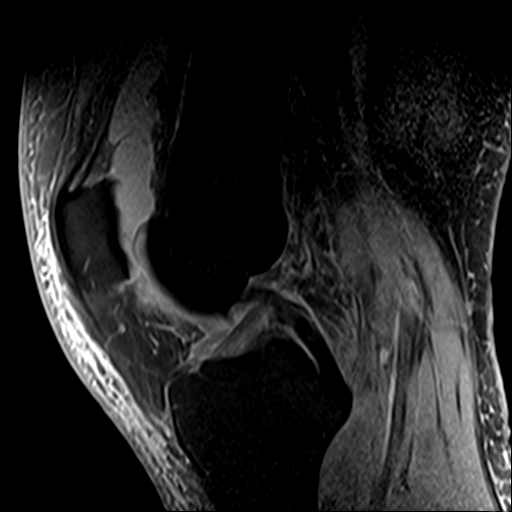
[im 30/36]
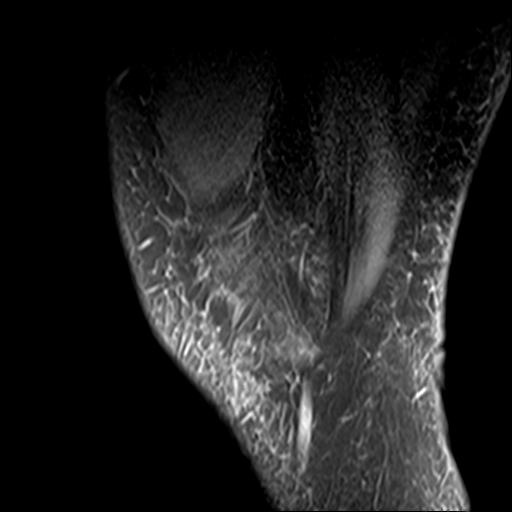

[18 of 40 positions shown; findings below may reference images not displayed]

FINDINGS: MENISCI

Medial meniscus: Grade 3 oblique tear of the posterior horn extends
from the inferior surface to the periphery for example on images
23-25 of series 7. Along the meniscal periphery there is a 3.1 by
1.4 by 1.3 cm (volume = 3 cm^3) parameniscal cyst. Possible small
flap component along the free edge of the posterior horn.

Lateral meniscus: Horizontal signal in the anterior and posterior
horn and midbody of the lateral meniscus is primarily grade 2 but on
image 11 of series 7 and image 20 series 6 appears to have subtle
grade 3 extension to the inferior surface in the posterior horn.

LIGAMENTS

Cruciates:  Unremarkable

Collaterals:  Unremarkable

CARTILAGE

Patellofemoral: Moderate chondral thinning along the posterior
patellar ridge and adjacent medial patellar facet. Mild chondral
thinning medial and laterally along the femoral trochlear groove.

Medial:  Unremarkable

Lateral:  Unremarkable

Joint:  Large knee effusion.  Thin medial plica.

Popliteal Fossa: Trace infiltrative edema posterolaterally deep to
the lateral head gastrocnemius and extending along the popliteal
space.

Extensor Mechanism:  Prepatellar subcutaneous edema

Bones: No significant extra-articular osseous abnormalities
identified.

Other: No supplemental non-categorized findings.
IMPRESSION: 1. Grade 3 oblique tear posterior horn medial meniscus involving the
inferior meniscal surface in the periphery, with associated
parameniscal cyst.
2. Subtle grade 3 tear of the posterior horn lateral meniscus
involving the inferior surface.
3. Large knee effusion with thin medial plica.
4. Moderate chondral thinning along portions of the patella with
mild chondral thinning in the femoral trochlear groove.
5. Prepatellar subcutaneous edema. Trace infiltrative edema along
the popliteal space.

## 2022-12-14 DIAGNOSIS — S39012D Strain of muscle, fascia and tendon of lower back, subsequent encounter: Secondary | ICD-10-CM | POA: Diagnosis not present

## 2022-12-14 DIAGNOSIS — M6281 Muscle weakness (generalized): Secondary | ICD-10-CM | POA: Diagnosis not present

## 2022-12-14 DIAGNOSIS — R262 Difficulty in walking, not elsewhere classified: Secondary | ICD-10-CM | POA: Diagnosis not present

## 2022-12-17 DIAGNOSIS — M6281 Muscle weakness (generalized): Secondary | ICD-10-CM | POA: Diagnosis not present

## 2022-12-17 DIAGNOSIS — S39012D Strain of muscle, fascia and tendon of lower back, subsequent encounter: Secondary | ICD-10-CM | POA: Diagnosis not present

## 2022-12-17 DIAGNOSIS — R262 Difficulty in walking, not elsewhere classified: Secondary | ICD-10-CM | POA: Diagnosis not present

## 2022-12-27 ENCOUNTER — Encounter (HOSPITAL_COMMUNITY): Payer: Self-pay

## 2022-12-27 ENCOUNTER — Emergency Department (HOSPITAL_COMMUNITY)
Admission: EM | Admit: 2022-12-27 | Discharge: 2022-12-27 | Disposition: A | Discharge status: Home / Self Care | Payer: Medicare HMO | Attending: Emergency Medicine | Admitting: Emergency Medicine

## 2022-12-27 ENCOUNTER — Emergency Department (HOSPITAL_COMMUNITY): Payer: Medicare HMO

## 2022-12-27 ENCOUNTER — Other Ambulatory Visit: Payer: Self-pay

## 2022-12-27 DIAGNOSIS — Z79899 Other long term (current) drug therapy: Secondary | ICD-10-CM | POA: Insufficient documentation

## 2022-12-27 DIAGNOSIS — I2510 Atherosclerotic heart disease of native coronary artery without angina pectoris: Secondary | ICD-10-CM | POA: Insufficient documentation

## 2022-12-27 DIAGNOSIS — I672 Cerebral atherosclerosis: Secondary | ICD-10-CM | POA: Insufficient documentation

## 2022-12-27 DIAGNOSIS — R110 Nausea: Secondary | ICD-10-CM | POA: Insufficient documentation

## 2022-12-27 DIAGNOSIS — F419 Anxiety disorder, unspecified: Secondary | ICD-10-CM | POA: Insufficient documentation

## 2022-12-27 DIAGNOSIS — I10 Essential (primary) hypertension: Secondary | ICD-10-CM | POA: Diagnosis not present

## 2022-12-27 DIAGNOSIS — I160 Hypertensive urgency: Secondary | ICD-10-CM | POA: Diagnosis not present

## 2022-12-27 DIAGNOSIS — G319 Degenerative disease of nervous system, unspecified: Secondary | ICD-10-CM | POA: Diagnosis not present

## 2022-12-27 LAB — CBC WITH DIFFERENTIAL/PLATELET
Abs Immature Granulocytes: 0.05 10*3/uL (ref 0.00–0.07)
Basophils Absolute: 0.1 10*3/uL (ref 0.0–0.1)
Basophils Relative: 1 %
Eosinophils Absolute: 0.3 10*3/uL (ref 0.0–0.5)
Eosinophils Relative: 4 %
HCT: 46.3 % (ref 39.0–52.0)
Hemoglobin: 15 g/dL (ref 13.0–17.0)
Immature Granulocytes: 1 %
Lymphocytes Relative: 14 %
Lymphs Abs: 1.1 10*3/uL (ref 0.7–4.0)
MCH: 29 pg (ref 26.0–34.0)
MCHC: 32.4 g/dL (ref 30.0–36.0)
MCV: 89.6 fL (ref 80.0–100.0)
Monocytes Absolute: 0.6 10*3/uL (ref 0.1–1.0)
Monocytes Relative: 8 %
Neutro Abs: 5.9 10*3/uL (ref 1.7–7.7)
Neutrophils Relative %: 72 %
Platelets: 221 10*3/uL (ref 150–400)
RBC: 5.17 MIL/uL (ref 4.22–5.81)
RDW: 12.9 % (ref 11.5–15.5)
WBC: 8.1 10*3/uL (ref 4.0–10.5)
nRBC: 0 % (ref 0.0–0.2)

## 2022-12-27 LAB — BASIC METABOLIC PANEL
Anion gap: 7 (ref 5–15)
BUN: 18 mg/dL (ref 8–23)
CO2: 25 mmol/L (ref 22–32)
Calcium: 9 mg/dL (ref 8.9–10.3)
Chloride: 106 mmol/L (ref 98–111)
Creatinine, Ser: 1.2 mg/dL (ref 0.61–1.24)
GFR, Estimated: >60 mL/min (ref >60)
Glucose, Bld: 107 mg/dL — ABNORMAL HIGH (ref 70–99)
Potassium: 4.4 mmol/L (ref 3.5–5.1)
Sodium: 138 mmol/L (ref 135–145)

## 2022-12-27 LAB — TROPONIN I (HIGH SENSITIVITY): Troponin I (High Sensitivity): 5 ng/L (ref <18)

## 2022-12-27 MED ORDER — AMLODIPINE BESYLATE 5 MG PO TABS: 5.0000 mg | ORAL_TABLET | Freq: Every day | ORAL | 0 refills | Status: DC

## 2022-12-27 MED ORDER — AMLODIPINE BESYLATE 5 MG PO TABS
5.0000 mg | ORAL_TABLET | Freq: Once | ORAL | Status: AC
  Administered 2022-12-27: 5 mg via ORAL
  Filled 2022-12-27: qty 1

## 2022-12-27 NOTE — ED Provider Notes (Signed)
[place] EMERGENCY DEPARTMENT AT [facility] Provider Note   CSN: [id number] Arrival date & time: 12/27/22  0820     History  Chief Complaint  Patient presents with   Hypertension    Theodore Lamb is a 77 y.o. male. With pmh CAD, HTN, HLD who presents with concerns of elevated blood pressure.  Patient takes his blood pressure daily but noted it was in the 200 range earlier today for systolics.  This was right after he had initially taken his blood pressure medication.  He felt anxious after getting this reading. Endorses some nausea.  He notes that has been slightly slightly uptrending.  Lately his systolic has been in the [id number].  He is compliant with his losartan daily.  He had some tingling in his face but no chest pain, no difficulty breathing, no focal weakness, no slurred speech, no severe headache.  Unsure if this was related but did take too much sodium bicarbonate on the 11th of this month.  He is unsure if his salt intake has been increasing.   Hypertension       Home Medications Prior to Admission medications   Medication Sig Start Date End Date Taking? Authorizing Provider  amLODipine (NORVASC) 5 MG tablet Take 1 tablet (5 mg total) by mouth daily. 12/27/22  Yes Elgie Congo, MD  chlorhexidine (PERIDEX) 0.12 % solution By Mouth Patient not taking: Reported on 06/15/2022 06/14/22   [provider]  Cholecalciferol (VITAMIN D3) 10 MCG (400 UNIT) tablet Take 400 Units by mouth daily.    [provider]  ezetimibe (ZETIA) 10 MG tablet TAKE ONE TABLET BY MOUTH ONCE DAILY 04/05/22   Nahser, Wonda Cheng, MD  Glucosamine-Chondroitin (GLUCOSAMINE CHONDR COMPLEX PO) Take 1 tablet by mouth daily in the afternoon.    [provider]  ibuprofen (ADVIL) 200 MG tablet Take 1 tablet by mouth every 6 (six) hours as needed.    [provider]  losartan (COZAAR) 100 MG tablet Take 1 tablet (100 mg total) by mouth daily in the afternoon.  12/11/21   Nahser, Wonda Cheng, MD  Multiple Vitamin (MULTIVITAMIN) tablet Take 1 tablet by mouth daily.    [provider]  Niacin (VITAMIN B-3 PO) Take 1 tablet by mouth daily in the afternoon.    [provider]  rosuvastatin (CRESTOR) 20 MG tablet TAKE ONE TABLET EACH DAY 04/05/22   Nahser, Wonda Cheng, MD  Zinc 50 MG TABS Take 1 tablet by mouth daily in the afternoon.    [provider]      Allergies    Bupropion, Candesartan cilexetil, and Leuprolide    Review of Systems   Review of Systems  Physical Exam Updated Vital Signs BP (!) 163/75   Pulse 76   Temp 98 F (36.7 C) (Oral)   Resp 15   Ht 5\' 8"  (1.727 m)   Wt 109 kg   SpO2 92%   BMI 36.54 kg/m  Physical Exam Constitutional: Alert and oriented. Well appearing and in no distress. Eyes: Conjunctivae are normal. ENT      Head: Normocephalic and atraumatic. Cardiovascular: S1, S2,  Normal and symmetric distal pulses are present in all extremities.Warm and well perfused. Respiratory: Normal respiratory effort. Breath sounds are normal. O2 sat 95-97 on RA Gastrointestinal:nondistended Musculoskeletal: Normal range of motion in all extremities. Neurologic: Normal speech and language.  CN II through XII grossly intact.  5 out of 5 strength bilateral upper and lower extremities.  Sensation grossly  intact.  Normal finger-nose bilaterally.  No gross focal neurologic deficits are appreciated. Skin: Skin is warm, dry  Psychiatric: Mood and affect are normal. Speech and behavior are normal.  ED Results / Procedures / Treatments   Labs (all labs ordered are listed, but only abnormal results are displayed) Labs Reviewed  BASIC METABOLIC PANEL - Abnormal; Notable for the following components:      Result Value   Glucose, Bld 107 (*)    All other components within normal limits  CBC WITH DIFFERENTIAL/PLATELET  TROPONIN I (HIGH SENSITIVITY)  TROPONIN I (HIGH SENSITIVITY)    EKG EKG  Interpretation  Date/Time:  Thursday December 27 2022 08:29:22 EDT Ventricular Rate:  59 PR Interval:  199 QRS Duration: 99 QT Interval:  421 QTC Calculation: 417 R Axis:   69 Text Interpretation: Sinus rhythm Confirmed by Georgina Snell [phone]) on 12/27/2022 8:40:56 AM  Radiology CT Head Wo Contrast  Result Date: 12/27/2022 CLINICAL DATA:  Hypertension EXAM: CT HEAD WITHOUT CONTRAST TECHNIQUE: Contiguous axial images were obtained from the base of the skull through the vertex without intravenous contrast. RADIATION DOSE REDUCTION: This exam was performed according to the departmental dose-optimization program which includes automated exposure control, adjustment of the mA and/or kV according to patient size and/or use of iterative reconstruction technique. COMPARISON:  None Available. FINDINGS: Brain: No acute intracranial findings are seen. There are no signs of bleeding within the cranium. There is no focal edema or mass effect. Cortical sulci are prominent. Vascular: Scattered arterial calcifications are seen. Skull: No acute findings are seen. Sinuses/Orbits: There is mild mucosal thickening in ethmoid and maxillary sinuses. Other: None. IMPRESSION: No acute intracranial findings are seen.  Atrophy. Electronically Signed   By: Elmer Picker M.D.   On: 12/27/2022 09:51   DG Chest 2 View  Result Date: 12/27/2022 CLINICAL DATA:  Hypertension EXAM: CHEST - 2 VIEW COMPARISON:  07/12/2020 FINDINGS: The heart size and mediastinal contours are within normal limits. Both lungs are clear. Disc degenerative disease of the thoracic spine. IMPRESSION: No acute abnormality of the lungs. Electronically Signed   By: Delanna Ahmadi M.D.   On: 12/27/2022 09:33    Procedures Procedures  Remain on constant cardiac monitoring, ranging from sinus bradycardia to normal sinus rhythm with normal rates intermittent PVCs.  Medications Ordered in ED Medications  amLODipine (NORVASC) tablet 5 mg (has no  administration in time range)    ED Course/ Medical Decision Making/ A&P   {                             Medical Decision Making Theodore Lamb is a 77 y.o. male. With pmh CAD, HTN, HLD who presents with concerns of elevated blood pressure.  Patient presented with initial BP 228/89 which down trended to 163/75 with no intervention.  No evidence of progressive target organ dysfunction to suggest hypertensive emergency and acute lowering of blood pressure at this point.  Specifically,   - No evidence of acute myocardial ischemia based on EKG, troponin (hs trop 5), absent chest pain - No evidence of acute renal failure based on reassuring creatinine 1.2 downtrended from baseline - No evidence of pulmonary edema based on pulmonary exam  and chest XR which I personally reviewed showing no pulmonary edema - No evidence of encephalopathy based on normal and stable mental status - No evidence for [facility], ICH, or other CVA based on normal neurologic exam and reassuring head CT -  No evidence for aortic dissection based absent chest pain, normal EKG and CXR, symmetric radial pulses  Will manage as asymptomatic hypertensive urgency.  Will initiate anti-hypertensive medication amlodipine 5 mg daily in addition to home losartan on discharge.  Discussed critical importance of follow up with PCP within 1 week and increased risk of devastating stroke, heart attack, respiratory distress, and other life threatening complications if blood pressure is not reduced appropriately.     Amount and/or Complexity of Data Reviewed Labs: ordered. Radiology: ordered.  Risk Prescription drug management.    Final Clinical Impression(s) / ED Diagnoses Final diagnoses:  Hypertensive urgency    Rx / DC Orders ED Discharge Orders          Ordered    amLODipine (NORVASC) 5 MG tablet  Daily        12/27/22 1024              Elgie Congo, MD 12/27/22 1027

## 2022-12-27 NOTE — ED Triage Notes (Addendum)
Patient said he thinks he is having a hypertensive emergency. Denies chest pain and headache. Takes losartan daily. Feeling nauseous and said his face feels prickly all over.

## 2022-12-27 NOTE — Discharge Instructions (Addendum)
You were seen in the emergency department today for elevated blood pressure.  Your exam/workup today does not appear to show any abnormalities that would require admission. It is very important that you take all of your medications as prescribed, every day.   Please follow up with your doctor in the next 2-3 days to discuss your elevated blood pressures and your current medication regimen.  I have written for a short course of amlodipine another blood pressure medication which can be taken with the losartan. Do not be taking if your blood pressure is already <120/80.   Return to the emergency department if you develop any sudden/severe headache, confusion, slurred speech, worsening vision, weakness or numbness of any arm or leg, or for any chest pain, pressure, or trouble breathing.

## 2022-12-31 DIAGNOSIS — S39012D Strain of muscle, fascia and tendon of lower back, subsequent encounter: Secondary | ICD-10-CM | POA: Diagnosis not present

## 2022-12-31 DIAGNOSIS — M6281 Muscle weakness (generalized): Secondary | ICD-10-CM | POA: Diagnosis not present

## 2022-12-31 DIAGNOSIS — L821 Other seborrheic keratosis: Secondary | ICD-10-CM | POA: Diagnosis not present

## 2022-12-31 DIAGNOSIS — L820 Inflamed seborrheic keratosis: Secondary | ICD-10-CM | POA: Diagnosis not present

## 2022-12-31 DIAGNOSIS — D2272 Melanocytic nevi of left lower limb, including hip: Secondary | ICD-10-CM | POA: Diagnosis not present

## 2022-12-31 DIAGNOSIS — L218 Other seborrheic dermatitis: Secondary | ICD-10-CM | POA: Diagnosis not present

## 2022-12-31 DIAGNOSIS — D2261 Melanocytic nevi of right upper limb, including shoulder: Secondary | ICD-10-CM | POA: Diagnosis not present

## 2022-12-31 DIAGNOSIS — D2262 Melanocytic nevi of left upper limb, including shoulder: Secondary | ICD-10-CM | POA: Diagnosis not present

## 2022-12-31 DIAGNOSIS — D225 Melanocytic nevi of trunk: Secondary | ICD-10-CM | POA: Diagnosis not present

## 2022-12-31 DIAGNOSIS — R262 Difficulty in walking, not elsewhere classified: Secondary | ICD-10-CM | POA: Diagnosis not present

## 2022-12-31 DIAGNOSIS — L853 Xerosis cutis: Secondary | ICD-10-CM | POA: Diagnosis not present

## 2023-01-04 DIAGNOSIS — S39012D Strain of muscle, fascia and tendon of lower back, subsequent encounter: Secondary | ICD-10-CM | POA: Diagnosis not present

## 2023-01-04 DIAGNOSIS — R262 Difficulty in walking, not elsewhere classified: Secondary | ICD-10-CM | POA: Diagnosis not present

## 2023-01-04 DIAGNOSIS — M6281 Muscle weakness (generalized): Secondary | ICD-10-CM | POA: Diagnosis not present

## 2023-01-07 DIAGNOSIS — S39012D Strain of muscle, fascia and tendon of lower back, subsequent encounter: Secondary | ICD-10-CM | POA: Diagnosis not present

## 2023-01-07 DIAGNOSIS — M6281 Muscle weakness (generalized): Secondary | ICD-10-CM | POA: Diagnosis not present

## 2023-01-07 DIAGNOSIS — R262 Difficulty in walking, not elsewhere classified: Secondary | ICD-10-CM | POA: Diagnosis not present

## 2023-01-11 DIAGNOSIS — M6281 Muscle weakness (generalized): Secondary | ICD-10-CM | POA: Diagnosis not present

## 2023-01-11 DIAGNOSIS — R262 Difficulty in walking, not elsewhere classified: Secondary | ICD-10-CM | POA: Diagnosis not present

## 2023-01-11 DIAGNOSIS — S39012D Strain of muscle, fascia and tendon of lower back, subsequent encounter: Secondary | ICD-10-CM | POA: Diagnosis not present

## 2023-01-14 DIAGNOSIS — M6281 Muscle weakness (generalized): Secondary | ICD-10-CM | POA: Diagnosis not present

## 2023-01-14 DIAGNOSIS — R262 Difficulty in walking, not elsewhere classified: Secondary | ICD-10-CM | POA: Diagnosis not present

## 2023-01-14 DIAGNOSIS — S39012D Strain of muscle, fascia and tendon of lower back, subsequent encounter: Secondary | ICD-10-CM | POA: Diagnosis not present

## 2023-01-18 DIAGNOSIS — M6281 Muscle weakness (generalized): Secondary | ICD-10-CM | POA: Diagnosis not present

## 2023-01-18 DIAGNOSIS — S39012D Strain of muscle, fascia and tendon of lower back, subsequent encounter: Secondary | ICD-10-CM | POA: Diagnosis not present

## 2023-01-18 DIAGNOSIS — R262 Difficulty in walking, not elsewhere classified: Secondary | ICD-10-CM | POA: Diagnosis not present

## 2023-01-21 DIAGNOSIS — R262 Difficulty in walking, not elsewhere classified: Secondary | ICD-10-CM | POA: Diagnosis not present

## 2023-01-21 DIAGNOSIS — M6281 Muscle weakness (generalized): Secondary | ICD-10-CM | POA: Diagnosis not present

## 2023-01-21 DIAGNOSIS — S39012D Strain of muscle, fascia and tendon of lower back, subsequent encounter: Secondary | ICD-10-CM | POA: Diagnosis not present

## 2023-01-29 ENCOUNTER — Telehealth: Payer: Self-pay | Admitting: Cardiovascular Disease

## 2023-01-29 DIAGNOSIS — J019 Acute sinusitis, unspecified: Secondary | ICD-10-CM | POA: Diagnosis not present

## 2023-01-29 DIAGNOSIS — R059 Cough, unspecified: Secondary | ICD-10-CM | POA: Diagnosis not present

## 2023-01-29 NOTE — Telephone Encounter (Signed)
Patient would like to speak with someone about BP management. Patient stated they were in the ED recently because his BP was 200/100. Patient also was told that he would be evaluated for his CPAP machine. Please advise.

## 2023-01-30 MED ORDER — AMLODIPINE BESYLATE 5 MG PO TABS: 5.0000 mg | ORAL_TABLET | Freq: Every day | ORAL | 0 refills | Status: DC

## 2023-01-30 NOTE — Telephone Encounter (Signed)
Returned call to patient to see how BP has been since starting Amlodipine. He states that it was well controlled, but he ran out 'a few days ago." States he is behind on calling because he has had an upper respiratory infection and has been on an antibiotic for that. Denies checking BP routinely at home. Did check BP yesterday mid-day (no amlodipine) and was 145/85. Did not follow-up with PCP as advised for BP. Will send in one month on Amlodipine and pt scheduled for f/u appt with [facility] on 02/01/23.  Pt has questions regarding settings on his CPAP machine and "how well it's really working." He states all he sees is a Medical illustrator. Hospital advised him that someone would be contacting him, but I don't see anything in computer. Will route to Dr Mayford Knife and nurse for follow-up. Of note, pt states he hasn't been wearing his CPAP for the last week "since I have all this head crap and coughing going on."

## 2023-01-31 NOTE — Progress Notes (Signed)
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   Leuprolide     Other reaction(s): nausea and brain fog     History of Present Illness    Theodore Lamb  is a 77 year old male with a PMH of nonobstructive CAD hypertension, palpitations, prostate CA s/p radiation therapy, OSA (on CPAP) who presents today for post hospital follow-up of hypertension.  Theodore Lamb was initially seen by Theodore Lamb on 06/2020 for evaluation of palpitations.  He was previously seen by Theodore Lamb.  He reported palpitations occurring spontaneously with a fluttering sensation.  He associated most with eating and possible indigestion.  He wore an event monitor that showed sinus rhythm with no serious arrhythmias.  He underwent a CTA 07/2020 that showed calcium scoring of 438 with moderate CAD noted.  He underwent FFR that showed no significant stenosis.  He completed a 2D echo that showed normal LV function with trivial MR on  06/28/2022.  He was last seen by Theodore Lamb on 09/03/2022 with complaint of shortness of breath with exertion.  He also noted daytime somnolence and underwent home sleep study that was positive for sleep apnea.  He was seen most recently in the ED on 12/27/2022 with elevated blood pressures and systolic pressures running in the 200 range.  He reported some tingling in his face but no chest discomfort.  Blood pressures in the ED were 228/89 and trended down to 163/75 with no intervention.  Troponins were obtained which were flat and EKG showed no evidence of ACS.  Chest x-ray and pulmonary exam showed no evidence of PE.  He was started on amlodipine 5 mg and was directed to continue his home losartan at discharge.  Since last being seen in the office patient reports that his blood pressures have been controlled at home but he is not able to check them as frequently.  He is currently recovering from a respiratory virus and finished a Z-Pak today.  His blood pressure today in the office was 106/58 and heart rate was 65 bpm.  He reports not taking amlodipine for the past 3 days due to running out of medication.  He is also had compliance issues with his Crestor and Zetia.  He reports  discontinuing them over 2 to 3 months ago due to increased swelling in his lower extremities.  We discussed in detail the importance of medication compliance in managing progression of coronary artery disease.  He is also experienced some episodes of bright lights vibrating lines across his eyes that have been occurring frequently.  He associates these symptoms with his blood pressure when it is elevated.  Patient denies chest pain, palpitations, dyspnea, PND, orthopnea, nausea, vomiting, dizziness, syncope, edema, weight gain, or early satiety.    ome Medications    Current Outpatient Medications  Medication Sig Dispense Refill   amLODipine (NORVASC) 5 MG tablet Take 1 tablet (5 mg total) by mouth daily. 30 tablet 0   chlorhexidine  (PERIDEX) 0.12 % solution By Mouth (Patient not taking: Reported on 06/15/2022)     Cholecalciferol (VITAMIN D3) 10 MCG (400 UNIT) tablet Take 400 Units by mouth daily.     ezetimibe (ZETIA) 10 MG tablet TAKE ONE TABLET BY MOUTH ONCE DAILY 90 tablet 2   Glucosamine-Chondroitin (GLUCOSAMINE CHONDR COMPLEX PO) Take 1 tablet by mouth daily in the afternoon.     ibuprofen (ADVIL) 200 MG tablet Take 1 tablet by mouth every 6 (six) hours as needed.     losartan (COZAAR) 100 MG tablet Take 1 tablet (100 mg total) by mouth daily in the afternoon. 90 tablet 3   Multiple Vitamin (MULTIVITAMIN) tablet Take 1 tablet by mouth daily.     Niacin (VITAMIN B-3 PO) Take 1 tablet by mouth daily in the afternoon.     rosuvastatin (CRESTOR) 20 MG tablet TAKE ONE TABLET EACH DAY 90 tablet 2   Zinc 50 MG TABS Take 1 tablet by mouth daily in the afternoon.     No current facility-administered medications for this visit.     Review of Systems  Please see the history of present illness.    (+) Flashing lights (+) Floaters  All other systems reviewed and are otherwise negative except as noted above.  Physical Exam    Wt Readings from Last 3 Encounters:  12/27/22 240 lb 4.8 oz (109 kg)  11/07/22 240 lb (108.9 kg)  09/03/22 246 lb 6.4 oz (111.8 kg)   GN:FAOZH were no vitals filed for this visit.,There is no height or weight on file to calculate BMI.  Constitutional:      Appearance: Healthy appearance. Not in distress.  Neck:     Vascular: JVD normal.  Pulmonary:     Effort: Pulmonary effort is normal.     Breath sounds: No wheezing. No rales. Diminished in the bases Cardiovascular:     Normal rate. Regular rhythm. Normal S1. Normal S2.      Murmurs: There is no murmur.  Edema:    +1 trace lower extremity edema Abdominal:     Palpations: Abdomen is soft non tender. There is no hepatomegaly.  Skin:    General: Skin is warm and dry.  Neurological:     General: No focal deficit present.     Mental  Status: Alert and oriented to person, place and time.     Cranial Nerves: Cranial nerves are intact.  EKG/LABS/ Recent Cardiac Studies    ECG personally reviewed by me today -none completed today  Cardiac Studies & Procedures       ECHOCARDIOGRAM  ECHOCARDIOGRAM COMPLETE 06/28/2022  Narrative ECHOCARDIOGRAM REPORT    Patient Name:   Theodore Lamb [facility] Date of Exam: 06/28/2022 Medical Rec #:  [id number]  Height:       68.0 in Accession #:    [id number]       Weight:       254.8 lb Date of Birth:  [birth year]       BSA:          2.265 m Patient Age:    75 years         BP:           145/77 mmHg Patient Gender: M                HR:           58 bpm. Exam Location:  [address]  Procedure: 2D Echo, Cardiac Doppler, Color Doppler and Strain Analysis  Indications:    R06.00 Dyspnea  History:        Patient has no prior history of Echocardiogram examinations. CAD, Signs/Symptoms:Palpitations; Risk Factors:HLD.  Sonographer:    Clearence Ped RCS Referring Phys: [phone] PHILIP J NAHSER  IMPRESSIONS   1. Left ventricular ejection fraction, by estimation, is 60 to 65%. The left ventricle has normal function. The left ventricle has no regional wall motion abnormalities. Left ventricular diastolic parameters were normal. The average left ventricular global longitudinal strain is -18.4 %. The global longitudinal strain is normal. 2. Right ventricular systolic function is normal. The right ventricular size is normal. Tricuspid regurgitation signal is inadequate for assessing PA pressure. 3. The mitral valve is normal in structure. Trivial mitral valve regurgitation. No evidence of mitral stenosis. 4. The aortic valve is tricuspid. Aortic valve regurgitation is not visualized. Aortic valve sclerosis/calcification is present, without any evidence of aortic stenosis. 5. There is borderline dilatation of the ascending aorta, measuring 38 mm. 6. The inferior vena cava is normal in size with  greater than 50% respiratory variability, suggesting right atrial pressure of 3 mmHg.  Comparison(s): No prior Echocardiogram.  FINDINGS Left Ventricle: Left ventricular ejection fraction, by estimation, is 60 to 65%. The left ventricle has normal function. The left ventricle has no regional wall motion abnormalities. The average left ventricular global longitudinal strain is -18.4 %. The global longitudinal strain is normal. The left ventricular internal cavity size was normal in size. There is no left ventricular hypertrophy. Left ventricular diastolic parameters were normal.  Right Ventricle: The right ventricular size is normal. Right ventricular systolic function is normal. Tricuspid regurgitation signal is inadequate for assessing PA pressure. The tricuspid regurgitant velocity is 2.66 m/s, and with an assumed right atrial pressure of 3 mmHg, the estimated right ventricular systolic pressure is 31.3 mmHg.  Left Atrium: Left atrial size was normal in size.  Right Atrium: Right atrial size was normal in size.  Pericardium: There is no evidence of pericardial effusion.  Mitral Valve: The mitral valve is normal in structure. Trivial mitral valve regurgitation. No evidence of mitral valve stenosis.  Tricuspid Valve: The tricuspid valve is normal in structure. Tricuspid valve regurgitation is trivial. No evidence of tricuspid stenosis.  Aortic Valve: The aortic valve is tricuspid. Aortic valve regurgitation is not visualized. Aortic valve sclerosis/calcification is present, without any evidence of aortic stenosis.  Pulmonic Valve: The pulmonic valve was normal in structure. Pulmonic valve regurgitation is not visualized. No evidence of pulmonic stenosis.  Aorta: The aortic root is normal in size and structure. There is borderline dilatation of the ascending aorta, measuring 38 mm.  Venous: The inferior vena cava is normal in size with greater than 50% respiratory variability, suggesting  right atrial pressure of  3 mmHg.  IAS/Shunts: No atrial level shunt detected by color flow Doppler.   LEFT VENTRICLE PLAX 2D LVIDd:         4.90 cm   Diastology LVIDs:         3.20 cm   LV e' medial:    9.57 cm/s LV PW:         1.10 cm   LV E/e' medial:  7.8 LV IVS:        0.90 cm   LV e' lateral:   11.70 cm/s LVOT diam:     1.90 cm   LV E/e' lateral: 6.4 LV SV:         79 LV SV Index:   35        2D Longitudinal Strain LVOT Area:     2.84 cm  2D Strain GLS (A2C):   -18.2 % 2D Strain GLS (A3C):   -22.3 % 2D Strain GLS (A4C):   -14.8 % 2D Strain GLS Avg:     -18.4 %  RIGHT VENTRICLE RV Basal diam:  2.70 cm RV S prime:     12.20 cm/s TAPSE (M-mode): 2.4 cm RVSP:           31.3 mmHg  LEFT ATRIUM             Index        RIGHT ATRIUM           Index LA diam:        3.70 cm 1.63 cm/m   RA Pressure: 3.00 mmHg LA Vol (A2C):   47.5 ml 20.97 ml/m  RA Area:     12.00 cm LA Vol (A4C):   42.9 ml 18.94 ml/m  RA Volume:   26.10 ml  11.52 ml/m LA Biplane Vol: 48.7 ml 21.50 ml/m AORTIC VALVE LVOT Vmax:   113.00 cm/s LVOT Vmean:  80.000 cm/s LVOT VTI:    0.279 m  AORTA Ao Root diam: 3.30 cm Ao Asc diam:  3.80 cm  MITRAL VALVE                TRICUSPID VALVE MV Area (PHT):              TR Peak grad:   28.3 mmHg MV Decel Time:              TR Vmax:        266.00 cm/s MV E velocity: 74.90 cm/s   Estimated RAP:  3.00 mmHg MV A velocity: 113.00 cm/s  RVSP:           31.3 mmHg MV E/A ratio:  0.66 SHUNTS Systemic VTI:  0.28 m Systemic Diam: 1.90 cm  Olga Millers MD Electronically signed by Olga Millers MD Signature Date/Time: 06/28/2022/11:55:16 AM    Final    MONITORS  CARDIAC EVENT MONITOR 07/28/2020  Narrative  Sinus rhythm - including sinus bradycardia, NSR, sinus tach.  Occasional PVCs  No serioius arrhythmias observed.   CT SCANS  CT CORONARY MORPH W/CTA COR W/SCORE 07/19/2020  Addendum 07/19/2020  8:52 PM ADDENDUM REPORT: 07/19/2020  20:50  HISTORY: Chest pain, nonspecific  EXAM: Cardiac/Coronary CT  TECHNIQUE: The patient was scanned on a [employer].  PROTOCOL: A 120 kV prospective scan was triggered in the descending thoracic aorta at 111 HU's. Axial non-contrast 3 mm slices were carried out through the heart. The data set was analyzed on a dedicated work station and scored using the Agatson method. Gantry rotation speed was 250 msecs  and collimation was 0.6 mm. Beta blockade and 0.8 mg of sl NTG was given. The 3D data set was reconstructed in 5% intervals of 35-75% of the R-R cycle. Diastolic phases were analyzed on a dedicated work station using MPR, MIP and VRT modes. The patient received 80mL OMNIPAQUE IOHEXOL 350 MG/ML SOLN of contrast.  FINDINGS: Coronary calcium score: The patient's coronary artery calcium score is 438, which places the patient in the 46 percentile.  Coronary arteries: Normal coronary origins.  Right dominance.  Right Coronary Artery: Normal caliber vessel, gives rise to PDA. There is scattered mixed calcified and noncalcified plaque throughout the RCA. Maximum stenosis in proximal RCA is 25-49% (mixed plaque), 25-49% in mid RCA (calcified)  Left Main Coronary Artery: Normal caliber vessel. There is noncalcified plaque in the proximal left main with 1-24% stenosis.  Left Anterior Descending Coronary Artery: Normal caliber vessel. There is scattered calcified plaque, with a focal calcified plaque in the mid LAD with 25-49% stenosis. Gives rise to two diagonal branches.  Left Circumflex Artery: Normal caliber vessel. There is scattered calcified plaque, with a focal calcified plaque in the mid LCx with 25-49% stenosis. Gives rise to 2 small OM branches.  Aorta: Normal size, 37 mm at the mid ascending aorta (level of the PA bifurcation) measured double oblique. Mild calcification and plaque. No dissection.  Aortic Valve: Focal calcification of the left coronary  cusp. Trileaflet.  Other findings:  Normal pulmonary vein drainage into the left atrium.  Normal left atrial appendage without a thrombus.  Normal size of the pulmonary artery.  IMPRESSION: 1. Moderate CAD, CADRADS = 3. Given moderate disease, CT FFR will be performed and reported separately.  2. Coronary calcium score of 438. This was 18 percentile for age and sex matched control.  3. Normal coronary origin with right dominance.   Electronically Signed By: Jodelle Red M.D. On: 07/19/2020 20:50  Narrative EXAM: OVER-READ INTERPRETATION  CT CHEST  The following report is an over-read performed by radiologist Dr. Charlett Nose of [facility] on 07/19/2020. This over-read does not include interpretation of cardiac or coronary anatomy or pathology. The coronary CTA interpretation by the cardiologist is attached.  COMPARISON:  None.  FINDINGS: Vascular: Heart is normal size. Scattered calcifications in the descending thoracic aorta. No aneurysm.  Mediastinum/Nodes: No adenopathy.  Small hiatal hernia.  Lungs/Pleura: No confluent opacities or effusions.  Upper Abdomen: Imaging into the upper abdomen demonstrates no acute findings.  Musculoskeletal: Bilateral gynecomastia. Degenerative changes in the thoracic spine.  IMPRESSION: Small hiatal hernia.  Descending aortic atherosclerosis.  Mild bilateral gynecomastia.  Electronically Signed: By: Charlett Nose M.D. On: 07/19/2020 15:28         STOP-Bang Score:  8        Lab Results  Component Value Date   WBC 8.1 12/27/2022   HGB 15.0 12/27/2022   HCT 46.3 12/27/2022   MCV 89.6 12/27/2022   PLT 221 12/27/2022   Lab Results  Component Value Date   CREATININE 1.20 12/27/2022   BUN 18 12/27/2022   NA 138 12/27/2022   K 4.4 12/27/2022   CL 106 12/27/2022   CO2 25 12/27/2022   Lab Results  Component Value Date   ALT 24 09/03/2022   AST 13 01/25/2021   ALKPHOS 62 01/25/2021    BILITOT 0.3 01/25/2021   Lab Results  Component Value Date   CHOL 133 09/03/2022   HDL 48 09/03/2022   LDLCALC 66 09/03/2022   TRIG 105 09/03/2022  CHOLHDL 2.8 09/03/2022    No results found for: "HGBA1C"   Assessment & Plan    1.  Essential hypertension: -Patient recently seen in the ED with elevated blood pressures with systolics in the 200s and was started on amlodipine 5 mg -Today patient's blood pressure is well-controlled at 106/58. -We will refill patient's amlodipine 5 mg and he was advised to continue his losartan 100 mg daily  2.  Nonobstructive CAD: -s/p cardiac CTA that showed calcium score of 438 with moderate CAD noted and no significant stenosis by FFR -Today patient reports no chest pain or shortness of breath. -Continue GDMT with Crestor 20 mg and Zetia 10 mg  3.  Hyperlipidemia: -Patient's last LDL cholesterol was 66 -Patient self discontinued his statins due to perceived lower extremity swelling 2 months ago. -We will restart Zetia 10 mg and Crestor 20 mg daily today -LFTs and lipids in 6 to 8 weeks  4.  History of OSA: -Patient reports compliance with CPAP  5.  Bilateral floaters: -Patient reports sensation of flashing lights and zigzag floating lines across his eyes when blood pressures are elevated. -CT scan of the brain during most recent ED workup showed scattered calcifications -Ambulatory referral to neurology for further evaluation.  Disposition: Follow-up with Theodore Miss, MD or APP in 3 months    Medication Adjustments/Labs and Tests Ordered: Current medicines are reviewed at length with the patient today.  Concerns regarding medicines are outlined above.   Signed, Napoleon Form, Leodis Rains, NP 01/31/2023, 11:58 AM [facility]

## 2023-02-01 ENCOUNTER — Encounter: Payer: Self-pay | Admitting: Nurse Practitioner

## 2023-02-01 ENCOUNTER — Ambulatory Visit: Payer: Medicare HMO | Attending: Nurse Practitioner | Admitting: Nurse Practitioner

## 2023-02-01 VITALS — BP 106/58 | HR 65 | Ht 68.0 in | Wt 244.8 lb

## 2023-02-01 DIAGNOSIS — H43393 Other vitreous opacities, bilateral: Secondary | ICD-10-CM | POA: Diagnosis not present

## 2023-02-01 DIAGNOSIS — E782 Mixed hyperlipidemia: Secondary | ICD-10-CM

## 2023-02-01 DIAGNOSIS — I2510 Atherosclerotic heart disease of native coronary artery without angina pectoris: Secondary | ICD-10-CM | POA: Diagnosis not present

## 2023-02-01 DIAGNOSIS — G4733 Obstructive sleep apnea (adult) (pediatric): Secondary | ICD-10-CM | POA: Diagnosis not present

## 2023-02-01 DIAGNOSIS — I10 Essential (primary) hypertension: Secondary | ICD-10-CM | POA: Diagnosis not present

## 2023-02-01 MED ORDER — EZETIMIBE 10 MG PO TABS: 10.0000 mg | ORAL_TABLET | Freq: Every day | ORAL | 2 refills | Status: DC

## 2023-02-01 MED ORDER — AMLODIPINE BESYLATE 5 MG PO TABS: 5.0000 mg | ORAL_TABLET | Freq: Every day | ORAL | 3 refills | Status: DC

## 2023-02-01 MED ORDER — ROSUVASTATIN CALCIUM 20 MG PO TABS: 20.0000 mg | ORAL_TABLET | Freq: Every day | ORAL | 2 refills | Status: DC

## 2023-02-01 NOTE — Patient Instructions (Addendum)
Medication Instructions:  RESTART Crestor 20mg  Take 1 tablet once a day  RESTART Zetia 10mg  Take 1 tablet once a day  *If you need a refill on your cardiac medications before your next appointment, please call your pharmacy*   Lab Work: 6-8 WEEKS FASTING LABS LIPIDS & LFT's If you have labs (blood work) drawn today and your tests are completely normal, you will receive your results only by: MyChart Message (if you have MyChart) OR A paper copy in the mail If you have any lab test that is abnormal or we need to change your treatment, we will call you to review the results.   Testing/Procedures: NONE ORDERED   Follow-Up: At [facility], you and your health needs are our priority.  As part of our continuing mission to provide you with exceptional heart care, we have created designated Provider Care Teams.  These Care Teams include your primary Cardiologist (physician) and Advanced Practice Providers (APPs -  Physician Assistants and Nurse Practitioners) who all work together to provide you with the care you need, when you need it.  We recommend signing up for the patient portal called "MyChart".  Sign up information is provided on this After Visit Summary.  MyChart is used to connect with patients for Virtual Visits (Telemedicine).  Patients are able to view lab/test results, encounter notes, upcoming appointments, etc.  Non-urgent messages can be sent to your provider as well.   To learn more about what you can do with MyChart, go to [email or web address].    Your next appointment:   3 month(s)  Provider:   Kristeen Miss, MD  or Robin Searing, NP   Other Instructions Check your blood pressure daily for 1 week, then contact the office with your readings.  You have been referred to neurology.

## 2023-02-04 DIAGNOSIS — M6281 Muscle weakness (generalized): Secondary | ICD-10-CM | POA: Diagnosis not present

## 2023-02-04 DIAGNOSIS — R262 Difficulty in walking, not elsewhere classified: Secondary | ICD-10-CM | POA: Diagnosis not present

## 2023-02-04 DIAGNOSIS — S39012D Strain of muscle, fascia and tendon of lower back, subsequent encounter: Secondary | ICD-10-CM | POA: Diagnosis not present

## 2023-02-06 ENCOUNTER — Telehealth: Payer: Self-pay | Admitting: *Deleted

## 2023-02-06 NOTE — Telephone Encounter (Signed)
Gave patient recommendation made by Dr. Mayford Knife left message on his voicemail and asked him to call back with questions.

## 2023-02-08 DIAGNOSIS — S39012D Strain of muscle, fascia and tendon of lower back, subsequent encounter: Secondary | ICD-10-CM | POA: Diagnosis not present

## 2023-02-08 DIAGNOSIS — R262 Difficulty in walking, not elsewhere classified: Secondary | ICD-10-CM | POA: Diagnosis not present

## 2023-02-08 DIAGNOSIS — M6281 Muscle weakness (generalized): Secondary | ICD-10-CM | POA: Diagnosis not present

## 2023-02-11 DIAGNOSIS — R262 Difficulty in walking, not elsewhere classified: Secondary | ICD-10-CM | POA: Diagnosis not present

## 2023-02-11 DIAGNOSIS — M6281 Muscle weakness (generalized): Secondary | ICD-10-CM | POA: Diagnosis not present

## 2023-02-11 DIAGNOSIS — S39012D Strain of muscle, fascia and tendon of lower back, subsequent encounter: Secondary | ICD-10-CM | POA: Diagnosis not present

## 2023-02-12 ENCOUNTER — Encounter: Payer: Self-pay | Admitting: Cardiology

## 2023-02-12 ENCOUNTER — Ambulatory Visit: Payer: Medicare HMO | Attending: Cardiology | Admitting: Cardiology

## 2023-02-12 VITALS — BP 118/62 | HR 66 | Ht 68.0 in | Wt 248.0 lb

## 2023-02-12 DIAGNOSIS — G4733 Obstructive sleep apnea (adult) (pediatric): Secondary | ICD-10-CM

## 2023-02-12 DIAGNOSIS — I10 Essential (primary) hypertension: Secondary | ICD-10-CM

## 2023-02-12 NOTE — Patient Instructions (Signed)
Medication Instructions:  Your physician recommends that you continue on your current medications as directed. Please refer to the Current Medication list given to you today.  *If you need a refill on your cardiac medications before your next appointment, please call your pharmacy*  Lab Work: None ordered today.  Testing/Procedures: None ordered today.  Follow-Up: At [facility], you and your health needs are our priority.  As part of our continuing mission to provide you with exceptional heart care, we have created designated Provider Care Teams.  These Care Teams include your primary Cardiologist (physician) and Advanced Practice Providers (APPs -  Physician Assistants and Nurse Practitioners) who all work together to provide you with the care you need, when you need it.  Your next appointment:   8 week(s)  The format for your next appointment:   In Person  Provider:   Armanda Magic, MD

## 2023-02-12 NOTE — Progress Notes (Signed)
Sleep Medicine CONSULT Note    Date:  02/12/2023   ID:  Theodore Lamb, DOB 11-07-1945, MRN [id number]  PCP:  Daisy Floro, MD  Cardiologist: Kristeen Miss, MD   Chief Complaint  Patient presents with   New Patient (Initial Visit)    Obstructive sleep apnea    History of Present Illness:  Theodore Lamb is a 77 y.o. male who is being seen today for the evaluation of obstructive sleep apnea at the request of Kristeen Miss, MD.  This is a39 year old man with a history of nonobstructive CAD, hypertension, palpitations, sleep apnea.  He initially underwent a home sleep study for excessive daytime sleepiness with a STOP-BANG score of 8. He told him that when he went camping with friends, they told him that he stopped breathing in his sleep.  On 09/09/2022 which showed severe obstructive sleep apnea with an AHI of 49/h and very minimal central sleep apnea with a central AHI of 7.2/h as well as nocturnal hypoxemia with O2 saturations less than 88% for 175 minutes.  He underwent CPAP titration but due to ongoing respiratory events could not be titrated adequately on CPAP and was prescribed auto BiPAP with IPAP max of 20 cm H2O, EPAP min of 5 cm H2O and pressure support 4 cm H2O.  He is now referred for sleep medicine consultation for treatment of sleep apnea.  He has struggled some with the mask moving if he sleeps on his side so now sleeping on his back.  He has not used it for a while due to having the flu.  He tolerates the full face mask and feels the pressure is adequate.  Since going on PAP he feels rested in the am and has no significant daytime sleepiness.  He denies any significant mouth or nasal dryness or nasal congestion. He says that he has to sleep supine when he wears the mask because it knocks the mask off when he is on his side.  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   Cholecalciferol (VITAMIN D3) 10 MCG (400 UNIT) tablet Take 400 Units by mouth daily.   ezetimibe (ZETIA) 10 MG tablet Take 1 tablet (10 mg total) by mouth daily.   Glucosamine-Chondroitin (GLUCOSAMINE CHONDR COMPLEX PO) Take 1 tablet by mouth daily in the afternoon.   ibuprofen (ADVIL) 200 MG tablet Take 1 tablet by mouth every 6 (six) hours as needed.   losartan (COZAAR) 100 MG tablet Take 1 tablet (100 mg total) by mouth daily  in the afternoon.   Multiple Vitamin (MULTIVITAMIN) tablet Take 1 tablet by mouth daily.   Niacin (VITAMIN B-3 PO) Take 1 tablet by mouth daily in the afternoon.   rosuvastatin (CRESTOR) 20 MG tablet Take 1 tablet (20 mg total) by mouth daily.   Zinc 50 MG TABS Take 1 tablet by mouth daily in the afternoon.   [DISCONTINUED] azithromycin (ZITHROMAX) 500 MG tablet Take 500 mg by mouth daily. Pt takes 1 tablet for 5 day.   [DISCONTINUED] chlorhexidine (PERIDEX) 0.12 % solution By Mouth    Allergies:   Bupropion, Candesartan cilexetil, and Leuprolide   Social History   Socioeconomic History   Marital status: Divorced    Spouse name: Not on file   Number of children: Not on file   Years of education: Not on file   Highest education level: Not on file  Occupational History   Not on file  Tobacco Use   Smoking status: Every Day    Smokeless tobacco: Current   Tobacco comments:    frequency  1 PPD . Smoking : smoking electric cigaretes . Alcohol : no     Substance and Sexual Activity   Alcohol use: No    Alcohol/week: 0.0 standard drinks of alcohol   Drug use: No   Sexual activity: Not on file  Other Topics Concern   Not on file  Social History Narrative   History of smoking cigarettes :Current smoker,   Frequency : 1 PPD Smoking: smoking electric  cigaretes. Alcohol: no.   Social Determinants of Health   Financial Resource Strain: Not on file  Food Insecurity: Not on file  Transportation Needs: Not on file  Physical Activity: Not on file  Stress: Not on file  Social Connections: Not on file     Family History:  The patient's family history includes Colon cancer in an other family member; Testicular cancer in his paternal uncle.   ROS:   Please see the history of present illness.    ROS All other systems reviewed and are negative.      No data to display          EKG was performed and demonstrates NSR with septal infarct   PHYSICAL EXAM:   VS:  BP 118/62   Pulse 66   Ht 5\' 8"  (1.727 m)   Wt 248 lb (112.5 kg)   SpO2 95%   BMI 37.71 kg/m    GEN: Well nourished, well developed, in no acute distress  HEENT: normal  Neck: no JVD, carotid bruits, or masses Cardiac: RRR; no murmurs, rubs, or gallops,no edema.  Intact distal pulses bilaterally.  Respiratory:  clear to auscultation bilaterally, normal work of breathing GI: soft, nontender, nondistended, + BS MS: no deformity or atrophy  Skin: warm and dry, no rash Neuro:  Alert and Oriented x 3, Strength and sensation are intact Psych: euthymic mood, full affect  Wt Readings from Last 3 Encounters:  02/12/23 248 lb (112.5 kg)  02/01/23 244 lb 12.8 oz (111 kg)  12/27/22 240 lb 4.8 oz (109 kg)      Studies/Labs Reviewed:   Home sleep study, PAP compliance download, PAP titration  Recent Labs: 09/03/2022: ALT 24 12/27/2022: BUN 18;  Creatinine, Ser 1.20; Hemoglobin 15.0; Platelets 221; Potassium 4.4; Sodium 138    Additional studies/ records that were reviewed today include:  none    ASSESSMENT:    1. OSA (obstructive sleep apnea)   2. Essential hypertension      PLAN:  In order of problems listed above:  OSA - The patient is tolerating PAP therapy well without any problems. The PAP download performed by his DME was personally reviewed and interpreted by me today and showed an AHI of 31.2 /hr on auto BiPAP  with 13% compliance in using more than 4 hours nightly.  The patient has been using and benefiting from PAP use and will continue to benefit from therapy.  -His download shows a pretty significant mask leak but he says that his device says that it does not leak.  He does not really feel any leakiness -I am going to change him to BiPAP at 24/20cm H2O >>he had an [facility] of 11/hr on sleep study at this pressure>>he feels like he is not getting enough air when it first starts up -I encouraged him to purchase a CPAP contour pillow so he can get off his back at night which should also help with apneas -encouraged him to change out his cushion every 4 to 6 weeks -followup with me in 8 weeks  2.  Hypertension -BP controlled on exam today -Continue prescription drug managed with amlodipine 5 mg daily, losartan 100 mg daily with as needed refills   Time Spent: 20 minutes total time of encounter, including 15 minutes spent in face-to-face patient care on the date of this encounter. This time includes coordination of care and counseling regarding above mentioned problem list. Remainder of non-face-to-face time involved reviewing chart documents/testing relevant to the patient encounter and documentation in the medical record. I have independently reviewed documentation from referring provider  Medication Adjustments/Labs and Tests Ordered: Current medicines are reviewed at length with the patient today.  Concerns regarding  medicines are outlined above.  Medication changes, Labs and Tests ordered today are listed in the Patient Instructions below.  There are no Patient Instructions on file for this visit.   Signed, Armanda Magic, MD  02/12/2023 11:46 AM    [facility] [address] [place], [place], [place]  [address] Phone: [phone]; Fax: [phone]

## 2023-02-13 ENCOUNTER — Encounter: Payer: Self-pay | Admitting: Cardiology

## 2023-02-13 NOTE — Addendum Note (Signed)
Addended by: Eden Lathe on: 02/13/2023 09:16 AM   Modules accepted: Orders

## 2023-02-15 DIAGNOSIS — M6281 Muscle weakness (generalized): Secondary | ICD-10-CM | POA: Diagnosis not present

## 2023-02-15 DIAGNOSIS — S39012D Strain of muscle, fascia and tendon of lower back, subsequent encounter: Secondary | ICD-10-CM | POA: Diagnosis not present

## 2023-02-15 DIAGNOSIS — R262 Difficulty in walking, not elsewhere classified: Secondary | ICD-10-CM | POA: Diagnosis not present

## 2023-02-16 ENCOUNTER — Emergency Department (HOSPITAL_COMMUNITY)
Admission: EM | Admit: 2023-02-16 | Discharge: 2023-02-16 | Disposition: A | Discharge status: Home / Self Care | Payer: Medicare HMO | Attending: Emergency Medicine | Admitting: Emergency Medicine

## 2023-02-16 ENCOUNTER — Emergency Department (HOSPITAL_COMMUNITY): Payer: Medicare HMO

## 2023-02-16 ENCOUNTER — Encounter (HOSPITAL_COMMUNITY): Payer: Self-pay

## 2023-02-16 ENCOUNTER — Other Ambulatory Visit: Payer: Self-pay

## 2023-02-16 DIAGNOSIS — R319 Hematuria, unspecified: Secondary | ICD-10-CM | POA: Diagnosis present

## 2023-02-16 DIAGNOSIS — R110 Nausea: Secondary | ICD-10-CM | POA: Diagnosis not present

## 2023-02-16 DIAGNOSIS — R350 Frequency of micturition: Secondary | ICD-10-CM | POA: Diagnosis not present

## 2023-02-16 DIAGNOSIS — Z8546 Personal history of malignant neoplasm of prostate: Secondary | ICD-10-CM | POA: Diagnosis not present

## 2023-02-16 DIAGNOSIS — R310 Gross hematuria: Secondary | ICD-10-CM | POA: Diagnosis not present

## 2023-02-16 DIAGNOSIS — K5730 Diverticulosis of large intestine without perforation or abscess without bleeding: Secondary | ICD-10-CM | POA: Diagnosis not present

## 2023-02-16 DIAGNOSIS — N3289 Other specified disorders of bladder: Secondary | ICD-10-CM | POA: Diagnosis not present

## 2023-02-16 LAB — URINALYSIS, W/ REFLEX TO CULTURE (INFECTION SUSPECTED)
Bacteria, UA: NONE SEEN
RBC / HPF: >50 RBC/hpf (ref 0–5)

## 2023-02-16 LAB — BASIC METABOLIC PANEL
Anion gap: 8 (ref 5–15)
BUN: 21 mg/dL (ref 8–23)
CO2: 23 mmol/L (ref 22–32)
Calcium: 8.9 mg/dL (ref 8.9–10.3)
Chloride: 104 mmol/L (ref 98–111)
Creatinine, Ser: 1.22 mg/dL (ref 0.61–1.24)
GFR, Estimated: >60 mL/min (ref >60)
Glucose, Bld: 104 mg/dL — ABNORMAL HIGH (ref 70–99)
Potassium: 3.9 mmol/L (ref 3.5–5.1)
Sodium: 135 mmol/L (ref 135–145)

## 2023-02-16 LAB — CBC
HCT: 43.7 % (ref 39.0–52.0)
Hemoglobin: 14.3 g/dL (ref 13.0–17.0)
MCH: 29.4 pg (ref 26.0–34.0)
MCHC: 32.7 g/dL (ref 30.0–36.0)
MCV: 89.7 fL (ref 80.0–100.0)
Platelets: 231 10*3/uL (ref 150–400)
RBC: 4.87 MIL/uL (ref 4.22–5.81)
RDW: 12.8 % (ref 11.5–15.5)
WBC: 10.2 10*3/uL (ref 4.0–10.5)
nRBC: 0 % (ref 0.0–0.2)

## 2023-02-16 MED ORDER — CEFDINIR 300 MG PO CAPS
300.0000 mg | ORAL_CAPSULE | Freq: Two times a day (BID) | ORAL | Status: DC
  Administered 2023-02-16: 300 mg via ORAL
  Filled 2023-02-16: qty 1

## 2023-02-16 MED ORDER — CEFDINIR 300 MG PO CAPS: 300.0000 mg | ORAL_CAPSULE | Freq: Two times a day (BID) | ORAL | 0 refills | Status: DC

## 2023-02-16 MED ORDER — CEFDINIR 300 MG PO CAPS: 300.0000 mg | ORAL_CAPSULE | Freq: Two times a day (BID) | ORAL | 0 refills | Status: AC

## 2023-02-16 NOTE — ED Notes (Signed)
Pt unable to provide urine specimen at this time, has tried multiple times, but can only produce a couple drops/blood

## 2023-02-16 NOTE — ED Triage Notes (Signed)
Hematuria this morning with urgency feeling, No other symptoms.

## 2023-02-16 NOTE — ED Provider Notes (Signed)
[place] EMERGENCY DEPARTMENT AT [facility] Provider Note   CSN: [id number] Arrival date & time: 02/16/23  1151     History  Chief Complaint  Patient presents with   Hematuria   Urinary Frequency    Theodore Lamb is a 77 y.o. male with a past medical history of prostate cancer status post radiation therapy presents today for evaluation of hematuria.  Patient reports seeing blood in his urine this morning when he used the restroom.  He reports increased urinary urgency.  Denies any pain, itching or burning sensation with urination.  History of prostate cancer status post radiation therapy.  Denies any fever.  He endorses nausea without vomiting.  Denies any abdominal pain or flank pain.  He tried to use the bathroom here to give urine sample but he only passed blood clots.   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 Authorizing Provider  amLODipine (NORVASC) 5 MG tablet Take 1 tablet (5 mg total) by mouth daily. 02/01/23   Gaston Islam., NP  cefdinir (OMNICEF) 300 MG capsule Take 1 capsule (300 mg total) by mouth 2 (two) times daily for 7 days. 02/16/23 02/23/23  Gowens, Lawrence [place]se[place], PA-C  Cholecalciferol (VITAMIN D3) 10 MCG (400  UNIT) tablet Take 400 Units by mouth daily.    [provider]  ezetimibe (ZETIA) 10 MG tablet Take 1 tablet (10 mg total) by mouth daily. 02/01/23   Gaston Islam., NP  Glucosamine-Chondroitin (GLUCOSAMINE CHONDR COMPLEX PO) Take 1 tablet by mouth daily in the afternoon.    [provider]  ibuprofen (ADVIL) 200 MG tablet Take 1 tablet by mouth every 6 (six) hours as needed.    [provider]  losartan (COZAAR) 100 MG tablet Take 1 tablet (100 mg total) by mouth daily in the afternoon. 12/11/21   Nahser, Deloris Ping, MD  Multiple Vitamin (MULTIVITAMIN) tablet Take 1 tablet by mouth daily.    [provider]  Niacin (VITAMIN B-3 PO) Take 1 tablet by mouth daily in the afternoon.    [provider]  rosuvastatin (CRESTOR) 20 MG tablet Take 1 tablet (20 mg total) by mouth daily. 02/01/23   Gaston Islam., NP  Zinc 50 MG TABS Take 1 tablet by mouth daily in the afternoon.    [provider]      Allergies    Bupropion, Candesartan cilexetil, and Leuprolide    Review of Systems   Review of Systems  Genitourinary:  Positive for frequency  and hematuria.    Physical Exam Updated Vital Signs BP (!) 146/80   Pulse 66   Temp 98 F (36.7 C)   Resp 18   Ht 5\' 8"  (1.727 m)   Wt 112.5 kg   SpO2 97%   BMI 37.71 kg/m  Physical Exam Vitals and nursing note reviewed.  Constitutional:      Appearance: Normal appearance.  HENT:     Head: Normocephalic and atraumatic.     Mouth/Throat:     Mouth: Mucous membranes are moist.  Eyes:     General: No scleral icterus. Cardiovascular:     Rate and Rhythm: Normal rate and regular rhythm.     Pulses: Normal pulses.     Heart sounds: Normal heart sounds.  Pulmonary:     Effort: Pulmonary effort is normal.     Breath sounds: Normal breath sounds.  Abdominal:     General: Abdomen is flat.     Palpations: Abdomen is soft.     Tenderness: There is no abdominal tenderness.  Genitourinary:     Comments: Pt attempted to give a urine sample. Gross hematuria noted. Bladder scan done by NT showed about 500 cc of urine.  Musculoskeletal:        General: No deformity.  Skin:    General: Skin is warm.     Findings: No rash.  Neurological:     General: No focal deficit present.     Mental Status: He is alert.  Psychiatric:        Mood and Affect: Mood normal.     ED Results / Procedures / Treatments   Labs (all labs ordered are listed, but only abnormal results are displayed) Labs Reviewed  BASIC METABOLIC PANEL - Abnormal; Notable for the following components:      Result Value   Glucose, Bld 104 (*)    All other components within normal limits  URINALYSIS, W/ REFLEX TO CULTURE (INFECTION SUSPECTED) - Abnormal; Notable for the following components:   Color, Urine RED (*)    APPearance TURBID (*)    Glucose, UA   (*)    Value: TEST NOT REPORTED DUE TO COLOR INTERFERENCE OF URINE PIGMENT   Hgb urine dipstick   (*)    Value: TEST NOT REPORTED DUE TO COLOR INTERFERENCE OF URINE PIGMENT   Bilirubin Urine   (*)    Value: TEST NOT REPORTED DUE TO COLOR INTERFERENCE OF URINE PIGMENT   Ketones, ur   (*)    Value: TEST NOT REPORTED DUE TO COLOR INTERFERENCE OF URINE PIGMENT   Protein, ur   (*)    Value: TEST NOT REPORTED DUE TO COLOR INTERFERENCE OF URINE PIGMENT   Nitrite   (*)    Value: TEST NOT REPORTED DUE TO COLOR INTERFERENCE OF URINE PIGMENT   Leukocytes,Ua   (*)    Value: TEST NOT REPORTED DUE TO COLOR INTERFERENCE OF URINE PIGMENT   All other components within normal limits  URINE CULTURE  CBC    EKG None  Radiology CT Renal Stone Study  Result Date: 02/16/2023 CLINICAL DATA:  Hematuria EXAM: CT ABDOMEN AND PELVIS WITHOUT CONTRAST TECHNIQUE: Multidetector CT imaging of the abdomen and pelvis was performed following the standard protocol without IV contrast. RADIATION DOSE REDUCTION: This exam was performed according to the departmental dose-optimization program  which includes automated exposure control, adjustment of the mA and/or kV according to patient size and/or use of iterative reconstruction technique. COMPARISON:  CT abdomen and pelvis dated  10/29/2014 FINDINGS: Lower chest: No focal consolidation or pulmonary nodule in the lung bases. Right middle lobe linear atelectasis/scarring. Lingular subsegmental atelectasis. No pleural effusion or pneumothorax demonstrated. Partially imaged heart size is normal. Coronary artery calcifications. Hepatobiliary: No focal hepatic lesions. No intra or extrahepatic biliary ductal dilation. Normal gallbladder. Pancreas: No focal lesions or main ductal dilation. Spleen: Normal in size without focal abnormality. Adrenals/Urinary Tract: No adrenal nodules. No suspicious renal mass, calculi or hydronephrosis. Hyperattenuating lobulated mass involving the bladder trigone measures 7.3 x 5.8 cm (2:73). Stomach/Bowel: Small hiatal hernia. Normal appearance of the stomach. Rounded hyperattenuating focus within the rectum measuring 9 x 7 mm (2:69). No abnormal bowel dilation. Colonic diverticulosis without acute diverticulitis. Appendix is not discretely seen. Vascular/Lymphatic: Aortic atherosclerosis. No enlarged abdominal or pelvic lymph nodes. Reproductive: Normal-sized prostate gland with fiducials in-situ. Other: No free fluid, fluid collection, or free air. Musculoskeletal: No acute or abnormal lytic or blastic osseous lesions. Multilevel degenerative changes of the partially imaged thoracic and lumbar spine. Grade 1 anterolisthesis at L5-S1 unchanged. IMPRESSION: 1. Hyperattenuating lobulated mass involving the bladder trigone measures 7.3 x 5.8 cm, and may represent urothelial neoplasm or adherent hematoma. Recommend cystoscopy for further evaluation. 2. Rounded hyperattenuating focus within the rectum measuring 9 x 7 mm, which may represent ingested foreign body or a polyp. Recommend correlation with colonoscopy if not performed  recently. 3. Colonic diverticulosis without acute diverticulitis. 4.  Aortic Atherosclerosis (ICD10-I70.0).  Aortic atherosclerosis. Electronically Signed   By: Agustin Cree M.D.   On: 02/16/2023 13:04    Procedures Procedures    Medications Ordered in ED Medications  cefdinir (OMNICEF) capsule 300 mg (has no administration in time range)    ED Course/ Medical Decision Making/ A&P                             Medical Decision Making Amount and/or Complexity of Data Reviewed Labs: ordered. Radiology: ordered.  Risk Prescription drug management.   This patient presents to the ED for hematuria, increased urinary urgency, this involves an extensive number of treatment options, and is a complaint that carries with a high risk of complications and morbidity.  The differential diagnosis includes pyelonephritis, cystitis, kidney stone, infected stone, malignancy, trauma, infectious etiology.  This is not an exhaustive list.  Lab tests: I ordered and personally interpreted labs.  The pertinent results include: WBC unremarkable. Hbg unremarkable. Platelets unremarkable. Electrolytes unremarkable. BUN, creatinine unremarkable.  Urinalysis showed no white count, no bacteriuria.  Imaging studies: I ordered imaging studies. I personally reviewed, interpreted imaging and agree with the radiologist's interpretations. The results include: CT renal show 7.3 x 5.8 cm ? neoplasm versus hematoma.  It also show 9 x 7 mm ? ingested foreign body versus polyp.  Problem list/ ED course/ Critical interventions/ Medical management: HPI: See above Vital signs within normal range and stable throughout visit. Laboratory/imaging studies significant for: See above. On physical examination, patient is afebrile and appears in no acute distress.  Patient presents with hematuria and increased urinary frequency and urgency started this morning.  There was no tenderness to palpation to the abdomen.  Bladder scan done at  bedside by nurse tech showed 500 cc of urine.  Foley catheter ordered.  Patient voided about 400 cc of blood mixed urine.  CBC with no evidence of leukocytosis or anemia.  CMP with no acute electrolyte abnormality or AKI.  Patient has negative CVA tenderness.  Low suspicion for  pyelonephritis.  No evidence of sepsis clinically.  Patient denies any pain, nausea or vomiting.  CT scan results discussed with patient.  Advised patient to follow-up with urology, GI and PCP for further evaluation and management.  I sent in Rx of cefdinir. Strict return precaution discussed. I have reviewed the patient home medicines and have made adjustments as needed.  Cardiac monitoring/EKG: The patient was maintained on a cardiac monitor.  I personally reviewed and interpreted the cardiac monitor which showed an underlying rhythm of: sinus rhythm.  Additional history obtained: External records from outside source obtained and reviewed including: Chart review including previous notes, labs, imaging.  Consultations obtained: I requested consultation with Dr. Pete Glatter urology, and discussed lab and imaging findings as well as pertinent plan.  He recommended indwelling Foley catheter and to the patient see urology outpatient.  He also recommended cefdinir p.o. twice daily.  Disposition Continued outpatient therapy. Follow-up with urology recommended for reevaluation of symptoms. Treatment plan discussed with patient.  Pt acknowledged understanding was agreeable to the plan. Worrisome signs and symptoms were discussed with patient, and patient acknowledged understanding to return to the ED if they noticed these signs and symptoms. Patient was stable upon discharge.   This chart was dictated using voice recognition software.  Despite best efforts to proofread,  errors can occur which can change the documentation meaning.          Final Clinical Impression(s) / ED Diagnoses Final diagnoses:  Gross hematuria    Rx  / DC Orders ED Discharge Orders          Ordered    cefdinir (OMNICEF) 300 MG capsule  2 times daily,   Status:  Discontinued        02/16/23 1458    cefdinir (OMNICEF) 300 MG capsule  2 times daily        02/16/23 1538              Jeanelle Malling, [place] 02/16/23 1550    Bethann Berkshire, MD 02/16/23 1926

## 2023-02-16 NOTE — Discharge Instructions (Addendum)
Your CT scan today showed a mass versus blood clots in your bladder and polyps versus foreign body in the rectum. Please take your antibiotics as prescribed. Take tylenol/ibuprofen for pain. I recommend close follow-up with urology, GI and PCP for reevaluation.  Please do not hesitate to return to emergency department if worrisome signs symptoms we discussed become apparent.

## 2023-02-16 NOTE — ED Notes (Signed)
Leg bag placed for pt catheter

## 2023-02-17 ENCOUNTER — Encounter (HOSPITAL_COMMUNITY): Payer: Self-pay

## 2023-02-17 ENCOUNTER — Emergency Department (HOSPITAL_COMMUNITY)
Admission: EM | Admit: 2023-02-17 | Discharge: 2023-02-17 | Disposition: A | Discharge status: Home / Self Care | Payer: Medicare HMO | Attending: Emergency Medicine | Admitting: Emergency Medicine

## 2023-02-17 DIAGNOSIS — T83091A Other mechanical complication of indwelling urethral catheter, initial encounter: Secondary | ICD-10-CM | POA: Diagnosis not present

## 2023-02-17 DIAGNOSIS — Z8546 Personal history of malignant neoplasm of prostate: Secondary | ICD-10-CM | POA: Insufficient documentation

## 2023-02-17 DIAGNOSIS — Z79899 Other long term (current) drug therapy: Secondary | ICD-10-CM | POA: Diagnosis not present

## 2023-02-17 DIAGNOSIS — T83098A Other mechanical complication of other indwelling urethral catheter, initial encounter: Secondary | ICD-10-CM | POA: Diagnosis not present

## 2023-02-17 DIAGNOSIS — R310 Gross hematuria: Secondary | ICD-10-CM

## 2023-02-17 DIAGNOSIS — Z85828 Personal history of other malignant neoplasm of skin: Secondary | ICD-10-CM | POA: Insufficient documentation

## 2023-02-17 DIAGNOSIS — T839XXA Unspecified complication of genitourinary prosthetic device, implant and graft, initial encounter: Secondary | ICD-10-CM

## 2023-02-17 DIAGNOSIS — R319 Hematuria, unspecified: Secondary | ICD-10-CM | POA: Insufficient documentation

## 2023-02-17 DIAGNOSIS — R339 Retention of urine, unspecified: Secondary | ICD-10-CM | POA: Insufficient documentation

## 2023-02-17 LAB — URINE CULTURE: Culture: NO GROWTH

## 2023-02-17 NOTE — Consult Note (Signed)
Urology Consult   Physician requesting consult: Sherian Maroon, PA  Reason for consult: Gross hematuria, urinary retention  History of Present Illness: Theodore Lamb is a 77 y.o. male seen in consultation from Sherian Maroon, [place] for evaluation of gross hematuria and urinary retention.  He has a history of prostate cancer and is status post radiation therapy.  He has most recently been followed by Dr. Darvin Neighbours with [facility].  He was last seen in January 2024.  PSA at that time was 0.06.  He presented to the emergency room yesterday with acute onset of gross hematuria.  He was also having increased frequency and urgency.  No dysuria or flank pain.  He report did passing some small clots.  Urinalysis showed >50 RBCs, 0-5 WBCs.  Urine culture pending.  Creatinine normal at 1.22. CT abdomen and pelvis without contrast showed a hyperattenuating mass involving the bladder trigone measuring 7.3 x 5.8 cm possibly representing urothelial neoplasm or hematoma.  No renal or ureteral calculi and no evidence of obstruction.  A Foley catheter was placed with return of approximately 500 mL of bloody urine.  Patient was discharged home with a Foley catheter and on antibiotics.  He reports that the catheter had been draining well although his urine remained bloody.  Earlier today, he had lower abdominal discomfort and realized that the catheter was not draining.  He presented to the emergency room.   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  Allergies:  Allergies  Allergen Reactions   Bupropion     Other reaction(s): insomnia   Candesartan Cilexetil     Other reaction(s): does not feel  well.   Leuprolide     Other reaction(s): nausea and brain fog    Family History  Problem Relation Age of Onset   Testicular cancer Paternal Uncle    Colon cancer Other     Social History:  reports that he has been smoking. He uses smokeless tobacco. He reports that he does not drink alcohol and does not use drugs.  ROS: A complete review of systems was performed.  All systems are negative except for pertinent findings as noted.  Physical Exam:  Vital signs in last 24 hours: Temp:  [98 F (36.7 C)-98.9 F (37.2 C)] 98.7 F (37.1 C) (05/19 1330) Pulse Rate:  [65-84] 69 (05/19 1330) Resp:  [18] 18 (05/19 1330) BP: (145-156)/(65-80) 150/74 (05/19 1330) SpO2:  [95 %-98 %] 95 % (05/19 1330) GENERAL APPEARANCE:  Well appearing, well developed, well nourished, NAD HEENT:  Atraumatic, normocephalic, oropharynx clear NECK:  Supple without lymphadenopathy or thyromegaly ABDOMEN:  Soft, non-tender, no masses EXTREMITIES:  Moves all extremities well, without clubbing, cyanosis, or edema NEUROLOGIC:  Alert and oriented x 3,  CN II-XII grossly intact MENTAL STATUS:  appropriate BACK:  Non-tender to palpation, No CVAT SKIN:  Warm, dry, and intact GU:  circumcised phallus, foley in place draining dark red brown urine  Laboratory Data:  Recent Labs    02/16/23 1305  WBC 10.2  HGB 14.3  HCT 43.7  PLT 231    Recent Labs    02/16/23 1305  NA 135  K 3.9  CL 104  GLUCOSE 104*  BUN 21  CALCIUM 8.9  CREATININE 1.22     Results for orders placed or performed during the hospital encounter of 02/16/23 (from the  past 24 hour(s))  Urinalysis, w/ Reflex to Culture (Infection Suspected) -Urine, Clean Catch     Status: Abnormal   Collection Time: 02/16/23  2:03 PM  Result Value Ref Range   Specimen Source URINE, CLEAN CATCH    Color, Urine RED (A) YELLOW   APPearance TURBID (A) CLEAR   Specific Gravity, Urine  1.005 - 1.030    TEST NOT REPORTED DUE TO COLOR INTERFERENCE OF URINE PIGMENT   pH  5.0 - 8.0    TEST NOT REPORTED DUE TO COLOR INTERFERENCE OF URINE PIGMENT   Glucose, UA (A) NEGATIVE mg/dL    TEST NOT REPORTED DUE TO COLOR INTERFERENCE OF URINE PIGMENT   Hgb urine dipstick (A) NEGATIVE    TEST NOT REPORTED DUE TO COLOR INTERFERENCE OF URINE PIGMENT   Bilirubin Urine (A) NEGATIVE    TEST NOT REPORTED DUE TO COLOR INTERFERENCE OF URINE PIGMENT   Ketones, ur (A) NEGATIVE mg/dL    TEST NOT REPORTED DUE TO COLOR INTERFERENCE OF URINE PIGMENT   Protein, ur (A) NEGATIVE mg/dL    TEST NOT REPORTED DUE TO COLOR INTERFERENCE OF URINE PIGMENT   Nitrite (A) NEGATIVE    TEST NOT REPORTED DUE TO COLOR INTERFERENCE OF URINE PIGMENT   Leukocytes,Ua (A) NEGATIVE    TEST NOT REPORTED DUE TO COLOR INTERFERENCE OF URINE PIGMENT   RBC / HPF >50 0 - 5 RBC/hpf   WBC, UA 0-5 0 - 5 WBC/hpf   Bacteria, UA NONE SEEN NONE SEEN   Squamous Epithelial / HPF 0-5 0 - 5 /HPF  Urine Culture     Status: None   Collection Time: 02/16/23  3:13 PM   Specimen: Urine, Clean Catch  Result Value Ref Range   Specimen Description      URINE, CLEAN CATCH Performed at [facility], [address], [place], [place] [address]    Special Requests      NONE Performed at [facility], [address], [place], [place] [address]    Culture      NO GROWTH Performed at [facility], [address] [place], [place] [address]    Report Status 02/17/2023 FINAL    Recent Results (from the past 240 hour(s))  Urine Culture     Status: None   Collection Time: 02/16/23  3:13 PM   Specimen:  Urine, Clean Catch  Result Value Ref Range Status   Specimen Description   Final    URINE, CLEAN CATCH Performed at [facility], [address] [place], [place] [address]    Special Requests   Final    NONE Performed at [facility], [address], [place], [place] [address]  Culture   Final    NO GROWTH Performed at [facility], [address], [place], [place] [address]    Report Status 02/17/2023 FINAL  Final    Renal Function: Recent Labs    02/16/23 1305  CREATININE 1.22   Estimated Creatinine Clearance: 61.7 mL/min (by C-G formula based on SCr of 1.22 mg/dL).  Radiologic Imaging: CT Renal Stone Study  Result Date: 02/16/2023 CLINICAL DATA:  Hematuria EXAM: CT ABDOMEN AND PELVIS WITHOUT CONTRAST TECHNIQUE: Multidetector CT imaging of the abdomen and pelvis was performed following the standard protocol without IV contrast. RADIATION DOSE REDUCTION: This exam was performed according to the departmental dose-optimization program which includes automated exposure control, adjustment of the mA and/or kV according to patient size and/or use of iterative reconstruction technique. COMPARISON:  CT abdomen and pelvis dated 10/29/2014 FINDINGS: Lower chest: No focal consolidation or pulmonary nodule in the lung bases. Right middle lobe linear atelectasis/scarring. Lingular subsegmental atelectasis. No pleural effusion or pneumothorax demonstrated. Partially imaged heart size is normal. Coronary artery calcifications. Hepatobiliary: No focal hepatic lesions. No intra or extrahepatic biliary ductal dilation. Normal gallbladder. Pancreas: No focal lesions or main ductal dilation. Spleen: Normal in size without focal abnormality. Adrenals/Urinary Tract: No adrenal nodules. No suspicious renal mass, calculi or hydronephrosis. Hyperattenuating lobulated mass involving the bladder trigone measures 7.3 x 5.8 cm (2:73). Stomach/Bowel: Small hiatal  hernia. Normal appearance of the stomach. Rounded hyperattenuating focus within the rectum measuring 9 x 7 mm (2:69). No abnormal bowel dilation. Colonic diverticulosis without acute diverticulitis. Appendix is not discretely seen. Vascular/Lymphatic: Aortic atherosclerosis. No enlarged abdominal or pelvic lymph nodes. Reproductive: Normal-sized prostate gland with fiducials in-situ. Other: No free fluid, fluid collection, or free air. Musculoskeletal: No acute or abnormal lytic or blastic osseous lesions. Multilevel degenerative changes of the partially imaged thoracic and lumbar spine. Grade 1 anterolisthesis at L5-S1 unchanged. IMPRESSION: 1. Hyperattenuating lobulated mass involving the bladder trigone measures 7.3 x 5.8 cm, and may represent urothelial neoplasm or adherent hematoma. Recommend cystoscopy for further evaluation. 2. Rounded hyperattenuating focus within the rectum measuring 9 x 7 mm, which may represent ingested foreign body or a polyp. Recommend correlation with colonoscopy if not performed recently. 3. Colonic diverticulosis without acute diverticulitis. 4.  Aortic Atherosclerosis (ICD10-I70.0).  Aortic atherosclerosis. Electronically Signed   By: Agustin Cree M.D.   On: 02/16/2023 13:04    I independently reviewed the above imaging studies.  Impression/Recommendation Gross hematuria Urinary retention  Procedure: Foley insertion/bladder irrigation  The 16 French Foley catheter was removed.  Under sterile conditions, a 20 French Foley catheter was placed without difficulty.  10 mL of sterile water was placed in the balloon.  The bladder was then irrigated with approximately 1 L of normal saline.  The bladder irrigated easily without return of clots.  Following irrigation, the urine was a light pink.  The patient tolerated the procedure well.  Discharge home with Foley catheter.  Continue antibiotics.  Follow-up on urine culture done 02/16/2023. Will need follow-up as outpatient for  voiding trial and possible cystoscopy.  He would like to follow-up with [facility].  Di Kindle 02/17/2023, 1:43 PM

## 2023-02-17 NOTE — ED Provider Notes (Signed)
[place] EMERGENCY DEPARTMENT AT [facility] Provider Note   CSN: [id number] Arrival date & time: 02/17/23  1152     History  Chief Complaint  Patient presents with   Urinary Retention   foley catheter issue    Theodore Lamb is a 77 y.o. male.  HPI   77 year old male presents emergency department with complaints of urinary tension as well as continued hematuria.  Patient was seen yesterday for similar symptoms with Foley catheter placed.  States that he began with hematuria yesterday morning and noted urinary retention thereafter.  States that he was draining well up until about 2 to 3 hours ago when urine ceased to drain.  Patient was flushed with 10 cc of normal saline while in triage area with subsequent free-flowing urine with relief of patient's pain.  Denies fever, chills, vomiting, current abdominal pain.  Past medical history significant for prostate cancer, Squamous cell carcinoma, Home Medications Prior to Admission medications   Medication Sig Start Date End Date Taking? Authorizing Provider  amLODipine (NORVASC) 5 MG tablet Take 1 tablet (5 mg total) by mouth daily. 02/01/23   Gaston Islam., NP  cefdinir (OMNICEF) 300 MG capsule Take 1 capsule (300 mg total) by mouth 2 (two) times daily for 7 days. 02/16/23 02/23/23  Gowens, Lawrence [place], PA-C  Cholecalciferol (VITAMIN D3) 10 MCG (400 UNIT) tablet Take 400 Units by mouth daily.    [provider]  ezetimibe (ZETIA) 10 MG tablet Take 1 tablet (10 mg total) by mouth daily. 02/01/23   Gaston Islam., NP  Glucosamine-Chondroitin (GLUCOSAMINE CHONDR COMPLEX PO) Take 1 tablet by mouth daily in the afternoon.    [provider]  ibuprofen (ADVIL) 200 MG tablet Take 1 tablet by mouth every 6 (six) hours as needed.    [provider]  losartan (COZAAR) 100 MG tablet Take 1 tablet (100 mg total) by mouth daily in the afternoon. 12/11/21   Nahser, Deloris Ping, MD  Multiple Vitamin  (MULTIVITAMIN) tablet Take 1 tablet by mouth daily.    [provider]  Niacin (VITAMIN B-3 PO) Take 1 tablet by mouth daily in the afternoon.    [provider]  rosuvastatin (CRESTOR) 20 MG tablet Take 1 tablet (20 mg total) by mouth daily. 02/01/23   Gaston Islam., NP  Zinc 50 MG TABS Take 1 tablet by mouth daily in the afternoon.    [provider]      Allergies    Bupropion, Candesartan cilexetil, and Leuprolide    Review of Systems   Review of Systems  All other systems reviewed and are negative.   Physical Exam Updated Vital Signs BP (!) 150/74   Pulse 69   Temp 98.7 F (37.1 C)   Resp 18   SpO2 95%  Physical Exam Vitals and nursing note reviewed.  Constitutional:      General: He is not in acute distress.    Appearance: He is well-developed.  HENT:     Head: Normocephalic and atraumatic.  Eyes:     Conjunctiva/sclera: Conjunctivae normal.  Cardiovascular:     Rate and Rhythm: Normal rate and regular rhythm.     Heart sounds: No murmur heard. Pulmonary:     Effort: Pulmonary effort is normal. No respiratory distress.     Breath sounds: Normal breath sounds.  Abdominal:     Palpations: Abdomen is soft.     Tenderness: There is no abdominal tenderness.  Comments: No abdominal tenderness to palpation since Foley catheter has been irrigated.  Musculoskeletal:        General: No swelling.     Cervical back: Neck supple.  Skin:    General: Skin is warm and dry.     Capillary Refill: Capillary refill takes less than 2 seconds.  Neurological:     Mental Status: He is alert.  Psychiatric:        Mood and Affect: Mood normal.     ED Results / Procedures / Treatments   Labs (all labs ordered are listed, but only abnormal results are displayed) Labs Reviewed - No data to display  EKG None  Radiology CT Renal Stone Study  Result Date: 02/16/2023 CLINICAL DATA:  Hematuria EXAM: CT ABDOMEN AND PELVIS WITHOUT CONTRAST  TECHNIQUE: Multidetector CT imaging of the abdomen and pelvis was performed following the standard protocol without IV contrast. RADIATION DOSE REDUCTION: This exam was performed according to the departmental dose-optimization program which includes automated exposure control, adjustment of the mA and/or kV according to patient size and/or use of iterative reconstruction technique. COMPARISON:  CT abdomen and pelvis dated 10/29/2014 FINDINGS: Lower chest: No focal consolidation or pulmonary nodule in the lung bases. Right middle lobe linear atelectasis/scarring. Lingular subsegmental atelectasis. No pleural effusion or pneumothorax demonstrated. Partially imaged heart size is normal. Coronary artery calcifications. Hepatobiliary: No focal hepatic lesions. No intra or extrahepatic biliary ductal dilation. Normal gallbladder. Pancreas: No focal lesions or main ductal dilation. Spleen: Normal in size without focal abnormality. Adrenals/Urinary Tract: No adrenal nodules. No suspicious renal mass, calculi or hydronephrosis. Hyperattenuating lobulated mass involving the bladder trigone measures 7.3 x 5.8 cm (2:73). Stomach/Bowel: Small hiatal hernia. Normal appearance of the stomach. Rounded hyperattenuating focus within the rectum measuring 9 x 7 mm (2:69). No abnormal bowel dilation. Colonic diverticulosis without acute diverticulitis. Appendix is not discretely seen. Vascular/Lymphatic: Aortic atherosclerosis. No enlarged abdominal or pelvic lymph nodes. Reproductive: Normal-sized prostate gland with fiducials in-situ. Other: No free fluid, fluid collection, or free air. Musculoskeletal: No acute or abnormal lytic or blastic osseous lesions. Multilevel degenerative changes of the partially imaged thoracic and lumbar spine. Grade 1 anterolisthesis at L5-S1 unchanged. IMPRESSION: 1. Hyperattenuating lobulated mass involving the bladder trigone measures 7.3 x 5.8 cm, and may represent urothelial neoplasm or adherent  hematoma. Recommend cystoscopy for further evaluation. 2. Rounded hyperattenuating focus within the rectum measuring 9 x 7 mm, which may represent ingested foreign body or a polyp. Recommend correlation with colonoscopy if not performed recently. 3. Colonic diverticulosis without acute diverticulitis. 4.  Aortic Atherosclerosis (ICD10-I70.0).  Aortic atherosclerosis. Electronically Signed   By: Agustin Cree M.D.   On: 02/16/2023 13:04    Procedures Procedures    Medications Ordered in ED Medications - No data to display  ED Course/ Medical Decision Making/ A&P Clinical Course as of 02/17/23 1441  Sun Feb 17, 2023  1244 Consulted urology Dr. Pete Glatter who recommended insertion of larger Foley catheter 20 [place].  He will assess patient at bedside and determine disposition after irrigation. [CR]    Clinical Course User Index [CR] Peter Garter, PA                             Medical Decision Making  This patient presents to the ED for concern of urinary retention, hematuria, this involves an extensive number of treatment options, and is a complaint that carries with it a high risk of  complications and morbidity.  The differential diagnosis includes clot in Foley catheter, nephrolithiasis, bladder mass   Co morbidities that complicate the patient evaluation  See HPI   Additional history obtained:  Additional history obtained from EMR External records from outside source obtained and reviewed including hospital records   Lab Tests:  N/a   Imaging Studies ordered:  N/a   Cardiac Monitoring: / EKG:  The patient was maintained on a cardiac monitor.  I personally viewed and interpreted the cardiac monitored which showed an underlying rhythm of: Sinus rhythm   Consultations Obtained:  See ED course  Problem List / ED Course / Critical interventions / Medication management  Urinary retention, hematuria Reevaluation of the patient showed that the patient stayed the  same I have reviewed the patients home medicines and have made adjustments as needed   Social Determinants of Health:  Denies tobacco, illicit drug use   Test / Admission - Considered:  Urinary retention, hematuria Vitals signs significant for mild hypertension with blood pressure 150/74. Otherwise within normal range and stable throughout visit. 77 year old male presents emergency department with complaints of urinary retention as well as continued hematuria after Foley catheter was placed yesterday.  While in triage area, nurse flushed Foley catheter with subsequent flow and relief of patient's abdominal pain.  Consulted urology given patient's visit yesterday and subsequently today for similar kind of symptom.  Dr. Pete Glatter assessed patient at bedside and replaced Foley catheter with 20 French Foley catheter and subsequently irrigated bladder interval placement pink urine was appreciated.  Recommended outpatient follow-up with urology.  Patient educated regarding proper Foley catheter care at home as well as given supplies for at home irrigation.  Recommend follow-up with urology outpatient for reassessment of symptoms.  Urine culture still pending from visit yesterday and patient on empiric antibiotic therapy in the form of cefdinir.  Patient overall well-appearing, afebrile in no acute distress.  Further workup deemed unnecessary at this time while emergency department. Worrisome signs and symptoms were discussed with the patient, and the patient acknowledged understanding to return to the ED if noticed. Patient was stable upon discharge.          Final Clinical Impression(s) / ED Diagnoses Final diagnoses:  Urinary retention  Complication of Foley catheter, initial encounter (HCC)  Hematuria, unspecified type    Rx / DC Orders ED Discharge Orders     None         Peter Garter, [place] 02/17/23 1441    Bethann Berkshire, MD 02/19/23 1705

## 2023-02-17 NOTE — ED Notes (Signed)
Catheter flushed with 10ccs, flushed easily, and urine now draining freely

## 2023-02-17 NOTE — ED Triage Notes (Signed)
Pt arrives POV. C/o urine retention after having foley placed yesterday.  AOx4

## 2023-02-17 NOTE — Discharge Instructions (Addendum)
As discussed, see information attached for Foley catheter care at home.  Call number attached for your [facility] to set up an appointment.  Keep taking antibiotic as prescribed yesterday for prevention of infection. Please not hesitate to return to emergency department if the worrisome signs or symptoms we discussed become apparent.

## 2023-02-18 ENCOUNTER — Emergency Department (HOSPITAL_COMMUNITY)
Admission: EM | Admit: 2023-02-18 | Discharge: 2023-02-18 | Disposition: A | Discharge status: Home / Self Care | Payer: Medicare HMO | Attending: Emergency Medicine | Admitting: Emergency Medicine

## 2023-02-18 ENCOUNTER — Encounter (HOSPITAL_COMMUNITY): Payer: Self-pay

## 2023-02-18 ENCOUNTER — Other Ambulatory Visit: Payer: Self-pay

## 2023-02-18 DIAGNOSIS — Z85828 Personal history of other malignant neoplasm of skin: Secondary | ICD-10-CM | POA: Insufficient documentation

## 2023-02-18 DIAGNOSIS — Y732 Prosthetic and other implants, materials and accessory gastroenterology and urology devices associated with adverse incidents: Secondary | ICD-10-CM | POA: Diagnosis not present

## 2023-02-18 DIAGNOSIS — Z8546 Personal history of malignant neoplasm of prostate: Secondary | ICD-10-CM | POA: Diagnosis not present

## 2023-02-18 DIAGNOSIS — T83091A Other mechanical complication of indwelling urethral catheter, initial encounter: Secondary | ICD-10-CM | POA: Insufficient documentation

## 2023-02-18 DIAGNOSIS — R319 Hematuria, unspecified: Secondary | ICD-10-CM | POA: Insufficient documentation

## 2023-02-18 DIAGNOSIS — T83091D Other mechanical complication of indwelling urethral catheter, subsequent encounter: Secondary | ICD-10-CM

## 2023-02-18 NOTE — Discharge Instructions (Addendum)
I recommend close follow-up with urology for reevaluation.  Please do not hesitate to return to emergency department if worrisome signs symptoms we discussed become apparent.

## 2023-02-18 NOTE — ED Notes (Signed)
Pt foley cath flushed with 50ml NS, and had return . Cath currently draining into bag.

## 2023-02-18 NOTE — ED Triage Notes (Signed)
C/o urinary retention with hematuria.  Pt reports irrigating at home with small clots and unable to get urine return after irrigating.  Patient presents with foley catheter placed yesterday. Hx prostate cancer with radiation

## 2023-02-18 NOTE — ED Provider Notes (Signed)
[place] EMERGENCY DEPARTMENT AT [facility] Provider Note   CSN: [id number] Arrival date & time: 02/18/23  0740     History  Chief Complaint  Patient presents with   Hematuria    Theodore Lamb is a 77 y.o. male with a past medical history of prostate cancer status post radiation therapy in 2020 presents today for evaluation of Foley catheter issue.  Patient was evaluated yesterday and the day prior for hematuria and urinary retention.  A catheter was placed on 02/16/2023.  Patient reports emptying around 400 cc of mixed urine this morning then he has been unable to void.  He reports increased urinary urgency.  Says he has tried to irrigate the Foley catheter with no return of urine.  Of note patient was seen by Dr. Pete Glatter at bedside yesterday who placed a new 20 French Foley catheter.  Patient was also educated regarding proper Foley catheter care at home as well as given supplies for home irrigation.   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 Authorizing Provider  amLODipine (NORVASC) 5 MG tablet Take 1 tablet (5 mg total) by mouth daily. 02/01/23   Gaston Islam., NP  cefdinir (OMNICEF) 300 MG capsule Take 1 capsule (300 mg total) by mouth 2  (two) times daily for 7 days. 02/16/23 02/23/23  Gowens, Lawrence Marseilles, PA-C  Cholecalciferol (VITAMIN D3) 10 MCG (400 UNIT) tablet Take 400 Units by mouth daily.    [provider]  ezetimibe (ZETIA) 10 MG tablet Take 1 tablet (10 mg total) by mouth daily. 02/01/23   Gaston Islam., NP  Glucosamine-Chondroitin (GLUCOSAMINE CHONDR COMPLEX PO) Take 1 tablet by mouth daily in the afternoon.    [provider]  ibuprofen (ADVIL) 200 MG tablet Take 1 tablet by mouth every 6 (six) hours as needed.    [provider]  losartan (COZAAR) 100 MG tablet Take 1 tablet (100 mg total) by mouth daily in the afternoon. 12/11/21   Nahser, Deloris Ping, MD  Multiple Vitamin (MULTIVITAMIN) tablet Take 1 tablet by mouth daily.    [provider]  Niacin (VITAMIN B-3 PO) Take 1 tablet by mouth daily in the afternoon.    [provider]  rosuvastatin (CRESTOR) 20 MG tablet Take 1 tablet (20 mg total) by mouth daily. 02/01/23   Gaston Islam., NP  Zinc 50 MG TABS Take 1 tablet by mouth daily in the afternoon.    [provider]      Allergies  Bupropion, Candesartan cilexetil, and Leuprolide    Review of Systems   Review of Systems  Genitourinary:  Positive for hematuria.    Physical Exam Updated Vital Signs BP (!) 141/76 (BP Location: Right Arm)   Pulse 75   Temp 98.3 F (36.8 C) (Oral)   Resp 17   Wt 112 kg   SpO2 95%   BMI 37.54 kg/m  Physical Exam Vitals and nursing note reviewed.  Constitutional:      Appearance: Normal appearance.  HENT:     Head: Normocephalic and atraumatic.     Mouth/Throat:     Mouth: Mucous membranes are moist.  Eyes:     General: No scleral icterus. Cardiovascular:     Rate and Rhythm: Normal rate and regular rhythm.     Pulses: Normal pulses.     Heart sounds: Normal heart sounds.  Pulmonary:     Effort: Pulmonary effort is normal.     Breath sounds: Normal breath sounds.  Abdominal:     General: Abdomen is flat.      Palpations: Abdomen is soft.     Tenderness: There is no abdominal tenderness.  Genitourinary:    Comments: Foley catheter in place. Musculoskeletal:        General: No deformity.  Skin:    General: Skin is warm.     Findings: No rash.  Neurological:     General: No focal deficit present.     Mental Status: He is alert.  Psychiatric:        Mood and Affect: Mood normal.     ED Results / Procedures / Treatments   Labs (all labs ordered are listed, but only abnormal results are displayed) Labs Reviewed - No data to display  EKG None  Radiology CT Renal Stone Study  Result Date: 02/16/2023 CLINICAL DATA:  Hematuria EXAM: CT ABDOMEN AND PELVIS WITHOUT CONTRAST TECHNIQUE: Multidetector CT imaging of the abdomen and pelvis was performed following the standard protocol without IV contrast. RADIATION DOSE REDUCTION: This exam was performed according to the departmental dose-optimization program which includes automated exposure control, adjustment of the mA and/or kV according to patient size and/or use of iterative reconstruction technique. COMPARISON:  CT abdomen and pelvis dated 10/29/2014 FINDINGS: Lower chest: No focal consolidation or pulmonary nodule in the lung bases. Right middle lobe linear atelectasis/scarring. Lingular subsegmental atelectasis. No pleural effusion or pneumothorax demonstrated. Partially imaged heart size is normal. Coronary artery calcifications. Hepatobiliary: No focal hepatic lesions. No intra or extrahepatic biliary ductal dilation. Normal gallbladder. Pancreas: No focal lesions or main ductal dilation. Spleen: Normal in size without focal abnormality. Adrenals/Urinary Tract: No adrenal nodules. No suspicious renal mass, calculi or hydronephrosis. Hyperattenuating lobulated mass involving the bladder trigone measures 7.3 x 5.8 cm (2:73). Stomach/Bowel: Small hiatal hernia. Normal appearance of the stomach. Rounded hyperattenuating focus within the rectum  measuring 9 x 7 mm (2:69). No abnormal bowel dilation. Colonic diverticulosis without acute diverticulitis. Appendix is not discretely seen. Vascular/Lymphatic: Aortic atherosclerosis. No enlarged abdominal or pelvic lymph nodes. Reproductive: Normal-sized prostate gland with fiducials in-situ. Other: No free fluid, fluid collection, or free air. Musculoskeletal: No acute or abnormal lytic or blastic osseous lesions. Multilevel degenerative changes of the partially imaged thoracic and lumbar spine. Grade 1 anterolisthesis at L5-S1 unchanged. IMPRESSION: 1. Hyperattenuating lobulated mass involving the bladder trigone measures 7.3 x 5.8 cm, and may represent urothelial neoplasm or adherent hematoma. Recommend cystoscopy for further evaluation. 2. Rounded hyperattenuating focus within the rectum measuring 9 x 7  mm, which may represent ingested foreign body or a polyp. Recommend correlation with colonoscopy if not performed recently. 3. Colonic diverticulosis without acute diverticulitis. 4.  Aortic Atherosclerosis (ICD10-I70.0).  Aortic atherosclerosis. Electronically Signed   By: Agustin Cree M.D.   On: 02/16/2023 13:04    Procedures Procedures    Medications Ordered in ED Medications - No data to display  ED Course/ Medical Decision Making/ A&P                             Medical Decision Making  This patient presents to the ED for hematuria, urinary retention, this involves an extensive number of treatment options, and is a complaint that carries with a high risk of complications and morbidity.  The differential diagnosis includes pyelonephritis, cystitis, neoplasm, hematoma, Foley catheter complications, urethral obstruction, infectious etiology.  This is not an exhaustive list.  Imaging studies: CT abdomen pelvis obtained on 02/16/2023 show a 7 x 6 cm neoplasm versus hematoma.  Problem list/ ED course/ Critical interventions/ Medical management: HPI: See above Vital signs within normal range and  stable throughout visit. Laboratory/imaging studies significant for: See above. On physical examination, patient is afebrile and appears in no acute distress.  Patient returned to the emergency department after being unable to have urine return after irrigating the Foley catheter this morning.  Patient was evaluated bedside by Dr. Pete Glatter urology who placed a new 20 French Foley catheter.  Bladder scan today showed around 200 cc of urine in the bladder.  Nursing staff today was able to irrigate and have 160 cc of urine return with continuing draining in the Foley catheter.  Patient reports significant relief after irrigation.  Patient has been a patient of Dr. Earlene Plater at [facility] urology.  He was given information of both Dr. Pete Glatter at [facility] urology and Dr. Earlene Plater at [facility] urology.  Advised patient to follow-up with either one of the urologists for further evaluation and management.  Strict return precaution discussed. I have reviewed the patient home medicines and have made adjustments as needed.  Cardiac monitoring/EKG: The patient was maintained on a cardiac monitor.  I personally reviewed and interpreted the cardiac monitor which showed an underlying rhythm of: sinus rhythm.  Additional history obtained: External records from outside source obtained and reviewed including: Chart review including previous notes, labs, imaging.  Consultations obtained:  Disposition Continued outpatient therapy. Follow-up with urology recommended for reevaluation of symptoms. Treatment plan discussed with patient.  Pt acknowledged understanding was agreeable to the plan. Worrisome signs and symptoms were discussed with patient, and patient acknowledged understanding to return to the ED if they noticed these signs and symptoms. Patient was stable upon discharge.   This chart was dictated using voice recognition software.  Despite best efforts to proofread,  errors can occur which can change the documentation  meaning.          Final Clinical Impression(s) / ED Diagnoses Final diagnoses:  Hematuria, unspecified type  Obstruction of Foley catheter, subsequent encounter    Rx / DC Orders ED Discharge Orders     None         Jeanelle Malling, [place] 02/18/23 2049    Ernie Avena, MD 02/19/23 [phone]

## 2023-02-20 ENCOUNTER — Emergency Department (HOSPITAL_COMMUNITY): Payer: Medicare HMO

## 2023-02-20 ENCOUNTER — Emergency Department (HOSPITAL_COMMUNITY)
Admission: EM | Admit: 2023-02-20 | Discharge: 2023-02-20 | Disposition: A | Discharge status: Home / Self Care | Payer: Medicare HMO | Attending: Emergency Medicine | Admitting: Emergency Medicine

## 2023-02-20 ENCOUNTER — Other Ambulatory Visit: Payer: Self-pay

## 2023-02-20 DIAGNOSIS — Z79899 Other long term (current) drug therapy: Secondary | ICD-10-CM | POA: Insufficient documentation

## 2023-02-20 DIAGNOSIS — Z8546 Personal history of malignant neoplasm of prostate: Secondary | ICD-10-CM | POA: Diagnosis not present

## 2023-02-20 DIAGNOSIS — R110 Nausea: Secondary | ICD-10-CM | POA: Diagnosis not present

## 2023-02-20 DIAGNOSIS — R319 Hematuria, unspecified: Secondary | ICD-10-CM | POA: Diagnosis not present

## 2023-02-20 DIAGNOSIS — R42 Dizziness and giddiness: Secondary | ICD-10-CM | POA: Insufficient documentation

## 2023-02-20 DIAGNOSIS — R339 Retention of urine, unspecified: Secondary | ICD-10-CM | POA: Insufficient documentation

## 2023-02-20 DIAGNOSIS — R0602 Shortness of breath: Secondary | ICD-10-CM | POA: Diagnosis not present

## 2023-02-20 DIAGNOSIS — R55 Syncope and collapse: Secondary | ICD-10-CM | POA: Diagnosis not present

## 2023-02-20 DIAGNOSIS — I10 Essential (primary) hypertension: Secondary | ICD-10-CM | POA: Diagnosis not present

## 2023-02-20 LAB — CBC WITH DIFFERENTIAL/PLATELET
Abs Immature Granulocytes: 0.09 10*3/uL — ABNORMAL HIGH (ref 0.00–0.07)
Basophils Absolute: 0.1 10*3/uL (ref 0.0–0.1)
Basophils Relative: 1 %
Eosinophils Absolute: 0.2 10*3/uL (ref 0.0–0.5)
Eosinophils Relative: 2 %
HCT: 40.8 % (ref 39.0–52.0)
Hemoglobin: 13.3 g/dL (ref 13.0–17.0)
Immature Granulocytes: 1 %
Lymphocytes Relative: 12 %
Lymphs Abs: 1.1 10*3/uL (ref 0.7–4.0)
MCH: 29.3 pg (ref 26.0–34.0)
MCHC: 32.6 g/dL (ref 30.0–36.0)
MCV: 89.9 fL (ref 80.0–100.0)
Monocytes Absolute: 0.6 10*3/uL (ref 0.1–1.0)
Monocytes Relative: 6 %
Neutro Abs: 7.4 10*3/uL (ref 1.7–7.7)
Neutrophils Relative %: 78 %
Platelets: 210 10*3/uL (ref 150–400)
RBC: 4.54 MIL/uL (ref 4.22–5.81)
RDW: 12.6 % (ref 11.5–15.5)
WBC: 9.6 10*3/uL (ref 4.0–10.5)
nRBC: 0 % (ref 0.0–0.2)

## 2023-02-20 LAB — BASIC METABOLIC PANEL
Anion gap: 7 (ref 5–15)
BUN: 18 mg/dL (ref 8–23)
CO2: 22 mmol/L (ref 22–32)
Calcium: 8.2 mg/dL — ABNORMAL LOW (ref 8.9–10.3)
Chloride: 108 mmol/L (ref 98–111)
Creatinine, Ser: 1.13 mg/dL (ref 0.61–1.24)
GFR, Estimated: >60 mL/min (ref >60)
Glucose, Bld: 96 mg/dL (ref 70–99)
Potassium: 3.8 mmol/L (ref 3.5–5.1)
Sodium: 137 mmol/L (ref 135–145)

## 2023-02-20 LAB — HEPATIC FUNCTION PANEL
ALT: 18 U/L (ref 0–44)
AST: 18 U/L (ref 15–41)
Albumin: 3.2 g/dL — ABNORMAL LOW (ref 3.5–5.0)
Alkaline Phosphatase: 38 U/L (ref 38–126)
Bilirubin, Direct: 0.1 mg/dL (ref 0.0–0.2)
Indirect Bilirubin: 0.5 mg/dL (ref 0.3–0.9)
Total Bilirubin: 0.6 mg/dL (ref 0.3–1.2)
Total Protein: 6.4 g/dL — ABNORMAL LOW (ref 6.5–8.1)

## 2023-02-20 LAB — PROTIME-INR
INR: 1.1 (ref 0.8–1.2)
Prothrombin Time: 14.5 seconds (ref 11.4–15.2)

## 2023-02-20 LAB — BRAIN NATRIURETIC PEPTIDE: B Natriuretic Peptide: 76.9 pg/mL (ref 0.0–100.0)

## 2023-02-20 LAB — TROPONIN I (HIGH SENSITIVITY)
Troponin I (High Sensitivity): 6 ng/L (ref <18)
Troponin I (High Sensitivity): 5 ng/L (ref <18)

## 2023-02-20 LAB — LIPASE, BLOOD: Lipase: 37 U/L (ref 11–51)

## 2023-02-20 LAB — CBG MONITORING, ED: Glucose-Capillary: 92 mg/dL (ref 70–99)

## 2023-02-20 LAB — MAGNESIUM: Magnesium: 1.9 mg/dL (ref 1.7–2.4)

## 2023-02-20 MED ORDER — LACTATED RINGERS IV BOLUS
500.0000 mL | Freq: Once | INTRAVENOUS | Status: AC
  Administered 2023-02-20: 500 mL via INTRAVENOUS

## 2023-02-20 NOTE — Discharge Instructions (Addendum)
Keep your outpatient appointments as scheduled.  You should hear from the cardiology office in the next couple days to set up a follow-up appointment.  If you do not hear from them, call the number below.  Please return the emergency department anytime for any further symptoms of concern.

## 2023-02-20 NOTE — ED Provider Notes (Signed)
[place] EMERGENCY DEPARTMENT AT [facility] Provider Note   CSN: [id number] Arrival date & time: 02/20/23  1011     History  Chief Complaint  Patient presents with   Hematuria    Theodore Lamb is a 77 y.o. male.  HPI Patient presents for near syncope.  Medical history includes prostate CA, OSA, urine retention, HLD.  Patient was seen in the ED 4 days ago for urine retention and hematuria.  A Foley catheter was placed.  He was started on cefdinir.  He was seen the following day for poor output into Foley catheter.  Catheter was flushed with subsequent free-flowing urine.  He was seen in the ED by urologist, Dr. Pete Glatter, who replaced catheter with a 20 French Foley.  He underwent bladder irrigation.  He was again seen in the ED the following day, again for Foley dysfunction.  Patient reports that he has been taking his cefdinir.  This morning, he woke up lightheaded.  This resolved after drinking some coffee.  He emptied out his Foley bag and has had good drainage of urine throughout the day.  His hematuria cleared yesterday but did return today.  While he was out on his porch at home, he had a near syncopal episode.  He describes this as becoming short of breath, nauseous, and lightheaded.  He states that he will have episodes like this from time to time.  Although briefly improved, he had waves of worsening symptoms to return.  For this reason, he presents to the ED.  Currently, he states that the near-syncopal symptoms have resolved.  He has minor irritation at Foley insertion site but denies any other areas of discomfort.  He has an appointment with urology tomorrow morning.    Home Medications Prior to Admission medications   Medication Sig Start Date End Date Taking? Authorizing Provider  amLODipine (NORVASC) 5 MG tablet Take 1 tablet (5 mg total) by mouth daily. 02/01/23  Yes Gaston Islam., NP  cefdinir (OMNICEF) 300 MG capsule Take 1 capsule (300 mg total) by  mouth 2 (two) times daily for 7 days. 02/16/23 02/23/23 Yes Gowens, Mariah L, PA-C  Cholecalciferol (VITAMIN D3) 10 MCG (400 UNIT) tablet Take 400 Units by mouth daily.   Yes [provider]  ezetimibe (ZETIA) 10 MG tablet Take 1 tablet (10 mg total) by mouth daily. 02/01/23  Yes Gaston Islam., NP  ibuprofen (ADVIL) 200 MG tablet Take 1 tablet by mouth every 6 (six) hours as needed.   Yes [provider]  losartan (COZAAR) 100 MG tablet Take 1 tablet (100 mg total) by mouth daily in the afternoon. 12/11/21  Yes Nahser, Deloris Ping, MD  Multiple Vitamin (MULTIVITAMIN) tablet Take 1 tablet by mouth daily.   Yes [provider]  Niacin (VITAMIN B-3 PO) Take 1 tablet by mouth daily in the afternoon.   Yes [provider]  rosuvastatin (CRESTOR) 20 MG tablet Take 1 tablet (20 mg total) by mouth daily. 02/01/23  Yes Gaston Islam., NP  Glucosamine-Chondroitin (GLUCOSAMINE CHONDR COMPLEX PO) Take 1 tablet by mouth daily in the afternoon. Patient not taking: Reported on 02/20/2023    [provider]      Allergies    Bupropion, Candesartan cilexetil, and Leuprolide    Review of Systems   Review of Systems  Respiratory:  Positive for shortness of breath.   Gastrointestinal:  Positive for nausea.  Genitourinary:  Positive for hematuria.  Neurological:  Positive  for light-headedness.  All other systems reviewed and are negative.   Physical Exam Updated Vital Signs BP (!) 159/72   Pulse 60   Temp 98.1 F (36.7 C) (Oral)   Resp 13   Ht 5\' 8"  (1.727 m)   Wt 112 kg   SpO2 97%   BMI 37.54 kg/m  Physical Exam Vitals and nursing note reviewed.  Constitutional:      General: He is not in acute distress.    Appearance: Normal appearance. He is well-developed. He is not ill-appearing, toxic-appearing or diaphoretic.  HENT:     Head: Normocephalic and atraumatic.     Right Ear: External ear normal.     Left Ear: External ear normal.     Nose: Nose  normal.     Mouth/Throat:     Mouth: Mucous membranes are moist.  Eyes:     Extraocular Movements: Extraocular movements intact.     Conjunctiva/sclera: Conjunctivae normal.  Cardiovascular:     Rate and Rhythm: Normal rate and regular rhythm.     Heart sounds: No murmur heard. Pulmonary:     Effort: Pulmonary effort is normal. No respiratory distress.     Breath sounds: Normal breath sounds. No wheezing or rales.  Chest:     Chest wall: No tenderness.  Abdominal:     General: There is no distension.     Palpations: Abdomen is soft.     Tenderness: There is no abdominal tenderness.  Genitourinary:    Comments: Foley catheter in place.  Drainage bag has 1700 cc of urine, the color of fruit punch.  On bedside ultrasound, bladder is decompressed. Musculoskeletal:        General: No swelling.     Cervical back: Normal range of motion and neck supple.     Right lower leg: No edema.     Left lower leg: No edema.  Skin:    General: Skin is warm and dry.     Capillary Refill: Capillary refill takes less than 2 seconds.     Coloration: Skin is not jaundiced or pale.  Neurological:     General: No focal deficit present.     Mental Status: He is alert and oriented to person, place, and time.     Cranial Nerves: No cranial nerve deficit.     Sensory: No sensory deficit.     Motor: No weakness.     Coordination: Coordination normal.  Psychiatric:        Mood and Affect: Mood normal.        Behavior: Behavior normal.        Thought Content: Thought content normal.        Judgment: Judgment normal.     ED Results / Procedures / Treatments   Labs (all labs ordered are listed, but only abnormal results are displayed) Labs Reviewed  BASIC METABOLIC PANEL - Abnormal; Notable for the following components:      Result Value   Calcium 8.2 (*)    All other components within normal limits  CBC WITH DIFFERENTIAL/PLATELET - Abnormal; Notable for the following components:   Abs Immature  Granulocytes 0.09 (*)    All other components within normal limits  HEPATIC FUNCTION PANEL - Abnormal; Notable for the following components:   Total Protein 6.4 (*)    Albumin 3.2 (*)    All other components within normal limits  BRAIN NATRIURETIC PEPTIDE  MAGNESIUM  LIPASE, BLOOD  PROTIME-INR  CBG MONITORING, ED  TROPONIN I (HIGH  SENSITIVITY)  TROPONIN I (HIGH SENSITIVITY)    EKG EKG Interpretation  Date/Time:  Wednesday Feb 20 2023 11:49:48 EDT Ventricular Rate:  56 PR Interval:  196 QRS Duration: 99 QT Interval:  433 QTC Calculation: 418 R Axis:   73 Text Interpretation: Sinus rhythm Low voltage, precordial leads Confirmed by Gloris Manchester [phone]) on 02/20/2023 12:29:58 PM  Radiology DG Chest Port 1 View  Result Date: 02/20/2023 CLINICAL DATA:  Near syncopal episode. EXAM: PORTABLE CHEST 1 VIEW COMPARISON:  Radiographs 12/27/2022 and 07/12/2020. Cardiac CT 07/19/2020. FINDINGS: 1115 hours. Lordotic positioning. The heart size and mediastinal contours are stable with aortic atherosclerosis. The lungs appear clear. No pleural effusion or pneumothorax. Mild degenerative changes in the spine without evidence of acute osseous abnormality. IMPRESSION: No evidence of acute cardiopulmonary process. Aortic atherosclerosis. Electronically Signed   By: Carey Bullocks M.D.   On: 02/20/2023 11:33    Procedures Procedures    Medications Ordered in ED Medications  lactated ringers bolus 500 mL (500 mLs Intravenous New Bag/Given 02/20/23 1130)    ED Course/ Medical Decision Making/ A&P                             Medical Decision Making Amount and/or Complexity of Data Reviewed Labs: ordered. Radiology: ordered. ECG/medicine tests: ordered.   This patient presents to the ED for concern of near syncope, this involves an extensive number of treatment options, and is a complaint that carries with it a high risk of complications and morbidity.  The differential diagnosis includes  vasovagal episode, arrhythmia, dehydration, blood loss anemia, metabolic derangements   Co morbidities that complicate the patient evaluation  prostate CA, OSA, urine retention, HLD   Additional history obtained:  Additional history obtained from N/A External records from outside source obtained and reviewed including EMR   Lab Tests:  I Ordered, and personally interpreted labs.  The pertinent results include: Normal hemoglobin (although slightly decreased lately, presumably from ongoing hematuria), no leukocytosis, normal electrolytes, normal troponin   Imaging Studies ordered:  I ordered imaging studies including chest x-ray I independently visualized and interpreted imaging which showed no acute findings I agree with the radiologist interpretation   Cardiac Monitoring: / EKG:  The patient was maintained on a cardiac monitor.  I personally viewed and interpreted the cardiac monitored which showed an underlying rhythm of: Sinus rhythm   Problem List / ED Course / Critical interventions / Medication management  Patient presents following a near syncopal episode.  This occurred at home while patient was at rest.  Symptoms are resolved on his arrival in the ED.  Patient reports elevated blood pressures in the range of 200/100 prior to arrival.  On arrival, blood pressures improved to the range of 160s over 70s.  Patient is well-appearing on exam.  His breathing is unlabored.  He has no focal neurologic deficits.  Foley is appropriately draining.  Current urine is the color of fruit punch.  He has had waxing and waning hematuria over the last 5 days.  He has a appointment with urology tomorrow.  Given his near syncopal episode, workup was initiated.  Patient was kept on bedside cardiac monitor.  Lab results are unremarkable.  He does have a slight decrease in his hemoglobin from baseline, likely from ongoing hematuria over the past several days.  Hemoglobin, however, remains in normal  range.  Patient's vital signs remained normal while in the ED.  He remained in normal  sinus rhythm.  He remained asymptomatic.  I discussed possible observation admission which patient declined.  He does have a urology appointment for tomorrow at [facility] and it is his priority to make it to that.  Patient was able to stand and ambulate without any symptoms.  He was discharged in stable condition. I ordered medication including IV fluids for hydration Reevaluation of the patient after these medicines showed that the patient resolved I have reviewed the patients home medicines and have made adjustments as needed   Social Determinants of Health:  Has access to outpatient care   Test / Admission - Considered:  Of admission the patient declined.  He has an important urology appointment at [facility] tomorrow.         Final Clinical Impression(s) / ED Diagnoses Final diagnoses:  Near syncope    Rx / DC Orders ED Discharge Orders          Ordered    Ambulatory referral to Cardiology       Comments: If you have not heard from the Cardiology office within the next 72 hours please call [phone].   02/20/23 1506              Gloris Manchester, MD 02/20/23 1506

## 2023-02-20 NOTE — ED Triage Notes (Signed)
Pt BIB EMS from home c/o hematuria, pt has foley cath was supposed to see urologist tomorrow. Pt also reports "dizzy spell" 30 min pta. Denies pain

## 2023-02-21 ENCOUNTER — Other Ambulatory Visit (HOSPITAL_COMMUNITY): Payer: Self-pay

## 2023-02-21 DIAGNOSIS — C61 Malignant neoplasm of prostate: Secondary | ICD-10-CM | POA: Diagnosis not present

## 2023-02-21 DIAGNOSIS — R310 Gross hematuria: Secondary | ICD-10-CM | POA: Diagnosis not present

## 2023-02-21 DIAGNOSIS — R339 Retention of urine, unspecified: Secondary | ICD-10-CM | POA: Diagnosis not present

## 2023-02-21 DIAGNOSIS — R9389 Abnormal findings on diagnostic imaging of other specified body structures: Secondary | ICD-10-CM | POA: Diagnosis not present

## 2023-02-22 ENCOUNTER — Other Ambulatory Visit: Payer: Self-pay | Admitting: Cardiovascular Disease

## 2023-02-28 DIAGNOSIS — N529 Male erectile dysfunction, unspecified: Secondary | ICD-10-CM | POA: Diagnosis not present

## 2023-02-28 DIAGNOSIS — C61 Malignant neoplasm of prostate: Secondary | ICD-10-CM | POA: Diagnosis not present

## 2023-02-28 DIAGNOSIS — R310 Gross hematuria: Secondary | ICD-10-CM | POA: Diagnosis not present

## 2023-03-05 DIAGNOSIS — Z8546 Personal history of malignant neoplasm of prostate: Secondary | ICD-10-CM | POA: Diagnosis not present

## 2023-03-05 DIAGNOSIS — N3289 Other specified disorders of bladder: Secondary | ICD-10-CM | POA: Diagnosis not present

## 2023-03-05 DIAGNOSIS — N3040 Irradiation cystitis without hematuria: Secondary | ICD-10-CM | POA: Diagnosis not present

## 2023-03-05 DIAGNOSIS — N400 Enlarged prostate without lower urinary tract symptoms: Secondary | ICD-10-CM | POA: Diagnosis not present

## 2023-03-05 DIAGNOSIS — K219 Gastro-esophageal reflux disease without esophagitis: Secondary | ICD-10-CM | POA: Diagnosis not present

## 2023-03-05 DIAGNOSIS — L928 Other granulomatous disorders of the skin and subcutaneous tissue: Secondary | ICD-10-CM | POA: Diagnosis not present

## 2023-03-05 DIAGNOSIS — N320 Bladder-neck obstruction: Secondary | ICD-10-CM | POA: Diagnosis not present

## 2023-03-05 DIAGNOSIS — R310 Gross hematuria: Secondary | ICD-10-CM | POA: Diagnosis not present

## 2023-03-05 DIAGNOSIS — C61 Malignant neoplasm of prostate: Secondary | ICD-10-CM | POA: Diagnosis not present

## 2023-03-05 DIAGNOSIS — Z79899 Other long term (current) drug therapy: Secondary | ICD-10-CM | POA: Diagnosis not present

## 2023-03-11 DIAGNOSIS — Z48816 Encounter for surgical aftercare following surgery on the genitourinary system: Secondary | ICD-10-CM | POA: Diagnosis not present

## 2023-03-22 ENCOUNTER — Ambulatory Visit: Payer: Medicare HMO

## 2023-03-29 ENCOUNTER — Ambulatory Visit: Payer: Medicare HMO | Attending: Nurse Practitioner

## 2023-03-29 DIAGNOSIS — I2510 Atherosclerotic heart disease of native coronary artery without angina pectoris: Secondary | ICD-10-CM | POA: Diagnosis not present

## 2023-03-29 DIAGNOSIS — I10 Essential (primary) hypertension: Secondary | ICD-10-CM | POA: Diagnosis not present

## 2023-03-29 DIAGNOSIS — G4733 Obstructive sleep apnea (adult) (pediatric): Secondary | ICD-10-CM | POA: Diagnosis not present

## 2023-03-29 DIAGNOSIS — E782 Mixed hyperlipidemia: Secondary | ICD-10-CM | POA: Diagnosis not present

## 2023-03-30 LAB — HEPATIC FUNCTION PANEL
ALT: 16 IU/L (ref 0–44)
AST: 14 IU/L (ref 0–40)
Albumin: 3.9 g/dL (ref 3.8–4.8)
Alkaline Phosphatase: 67 IU/L (ref 44–121)
Bilirubin Total: 0.2 mg/dL (ref 0.0–1.2)
Bilirubin, Direct: <0.1 mg/dL (ref 0.00–0.40)
Total Protein: 6.4 g/dL (ref 6.0–8.5)

## 2023-03-30 LAB — LIPID PANEL
Chol/HDL Ratio: 4.6 ratio (ref 0.0–5.0)
Cholesterol, Total: 214 mg/dL — ABNORMAL HIGH (ref 100–199)
HDL: 47 mg/dL (ref >39)
LDL Chol Calc (NIH): 153 mg/dL — ABNORMAL HIGH (ref 0–99)
Triglycerides: 76 mg/dL (ref 0–149)
VLDL Cholesterol Cal: 14 mg/dL (ref 5–40)

## 2023-04-03 ENCOUNTER — Telehealth: Payer: Self-pay

## 2023-04-03 NOTE — Telephone Encounter (Signed)
Spoke with the patient who states he was taking the crestor but he thinks it caused leg swelling. He states he just had bladder surgery and thinks the Crestor caused leg pain in the past. He states he was taking the medication regularly before these labs. He wants to know we can change the Crestor because he thinks its causing his legs to swell but he can not say for certain. I advised the patient that I would speak with Alden Server and get back to him. He voiced understanding.

## 2023-04-05 DIAGNOSIS — Z0000 Encounter for general adult medical examination without abnormal findings: Secondary | ICD-10-CM | POA: Diagnosis not present

## 2023-04-05 DIAGNOSIS — M62830 Muscle spasm of back: Secondary | ICD-10-CM | POA: Diagnosis not present

## 2023-04-05 DIAGNOSIS — Z6837 Body mass index (BMI) 37.0-37.9, adult: Secondary | ICD-10-CM | POA: Diagnosis not present

## 2023-04-05 MED ORDER — ATORVASTATIN CALCIUM 20 MG PO TABS: 20.0000 mg | ORAL_TABLET | Freq: Every day | ORAL | 3 refills | Status: DC

## 2023-04-05 NOTE — Telephone Encounter (Signed)
Spoke with the patient who states he has been off of the Crestor for about a week or two and the swelling in his legs has decreased. He states he is willing to try the Lipitor and see if it helps. Gave him Alden Server recommendations and he voiced understanding.   Rx(s) sent to pharmacy electronically.

## 2023-04-05 NOTE — Telephone Encounter (Signed)
Theodore Lamb., NP  [name] days ago    Please stop Crestor and start atorvastatin 20 mg daily.  Please advise patient to continue Zetia 10 mg as prescribed.  We will recheck LFTs and lipids in 8 weeks.  Please let patient know that if he continues to have discomfort or myalgias with atorvastatin we will refer him to the lipid clinic for further evaluation and consideration of possible PCSK9 inhibitors.  Please let me know if you have any further questions.  Robin Searing, NP

## 2023-04-08 DIAGNOSIS — M545 Low back pain, unspecified: Secondary | ICD-10-CM | POA: Diagnosis not present

## 2023-04-18 DIAGNOSIS — R2243 Localized swelling, mass and lump, lower limb, bilateral: Secondary | ICD-10-CM | POA: Diagnosis not present

## 2023-04-18 DIAGNOSIS — R198 Other specified symptoms and signs involving the digestive system and abdomen: Secondary | ICD-10-CM | POA: Diagnosis not present

## 2023-04-18 DIAGNOSIS — I700 Atherosclerosis of aorta: Secondary | ICD-10-CM | POA: Diagnosis not present

## 2023-04-18 DIAGNOSIS — I10 Essential (primary) hypertension: Secondary | ICD-10-CM | POA: Diagnosis not present

## 2023-04-18 DIAGNOSIS — E7800 Pure hypercholesterolemia, unspecified: Secondary | ICD-10-CM | POA: Diagnosis not present

## 2023-04-26 DIAGNOSIS — R9389 Abnormal findings on diagnostic imaging of other specified body structures: Secondary | ICD-10-CM | POA: Diagnosis not present

## 2023-04-26 DIAGNOSIS — S39012D Strain of muscle, fascia and tendon of lower back, subsequent encounter: Secondary | ICD-10-CM | POA: Diagnosis not present

## 2023-04-26 DIAGNOSIS — R1319 Other dysphagia: Secondary | ICD-10-CM | POA: Diagnosis not present

## 2023-04-26 DIAGNOSIS — M545 Low back pain, unspecified: Secondary | ICD-10-CM | POA: Diagnosis not present

## 2023-04-26 DIAGNOSIS — K6289 Other specified diseases of anus and rectum: Secondary | ICD-10-CM | POA: Diagnosis not present

## 2023-04-26 DIAGNOSIS — M6281 Muscle weakness (generalized): Secondary | ICD-10-CM | POA: Diagnosis not present

## 2023-05-02 DIAGNOSIS — N529 Male erectile dysfunction, unspecified: Secondary | ICD-10-CM | POA: Diagnosis not present

## 2023-05-02 DIAGNOSIS — C61 Malignant neoplasm of prostate: Secondary | ICD-10-CM | POA: Diagnosis not present

## 2023-05-10 DIAGNOSIS — M545 Low back pain, unspecified: Secondary | ICD-10-CM | POA: Diagnosis not present

## 2023-05-10 DIAGNOSIS — S39012D Strain of muscle, fascia and tendon of lower back, subsequent encounter: Secondary | ICD-10-CM | POA: Diagnosis not present

## 2023-05-12 ENCOUNTER — Encounter: Payer: Self-pay | Admitting: Cardiovascular Disease

## 2023-05-12 NOTE — Progress Notes (Unsigned)
Cardiology Office Note:    Date:  05/13/2023   ID:  MILFRED ZEINER, DOB Jul 09, 1946, MRN [id number]  PCP:  Daisy Floro, MD  [facility] Cardiologist:    [facility] Electrophysiologist:  None   Referring MD: Daisy Floro, MD   Chief Complaint  Patient presents with   Shortness of Breath   Palpitations     Sept. 7, 2021   SAMAAD ORSBURN is a 77 y.o. male with a hx of  Palpitations and blurred vision. We were asked to see him today by Dr. Zachery Dauer for further evaluation of his palpitations.   Has been having unpredictable spells for the past 3-4 weeks.  Occurs spontaneously,   Not preceded by anything in particular  May be associated with eating  - several have occurred 30 min after eating .  Works around his house.   Does not exercise per se.   Does not walk much  Is limited by back pain .  The spells will last 15-30 min.  Has mild episodes that start with mild mid sternal pressure,  A flutter sensation, mild heart burn,  And the sensation that he needs to take more frequent breaths . Som bad episodes cause some vision changes, nausea , hands and feet numbness.  Assoc with tightness in his throat .   Checks his Omron BP cuff and his VS are normal .   Eats and drinks regularly .  Not associated with syncope or presyncope  The symptoms dissapate over 1-2 minutes and then he feels well .   Has been seen by Dr. Donnie Aho in the past  Has worn a monitor and did an echo  Was told his echo was fine .   December 09, 2020:   Phelan is seen today for follow up of his palpitations,  Moderate CAD , HLD   CCTA  in Oct. 2021 reveals : Coronary calcium score: The patient's coronary artery calcium score is 438, which places the patient in the 46 percentile. 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    December 11, 2021: Layth is seen for follow up of his palpitations, moderate CAD, HLD  Eats bacon regularly ,  might explain his BP elevation  Does not eat fast food  Wt is 247 lbs.    Sept. 15, 2023 Lonney is seen for follow up of his palpitations, moderte CAD , HLD  Is vaping .   , not smoking    No CP ,  Palpitations  Is not exercising regularly  , tore his R knee meniscus Coronary CTA:  IMPRESSION: 1. Moderate CAD, CADRADS = 3. Given moderate disease, CT FFR will be performed and reported separately.   2. Coronary calcium score of 438. This was 85 percentile for age and sex matched control.  I suspect he has some deconditioning   Advised more exercise Visit with ortho or sports medicine dr to work on knee pain    Dec. 4, 2023  Darle is seen for follow up of is HLD,  Echo  from Sept 28, 2023 Normal LV function Trivial MR  No structural cardiac issues to explain his DOE Likely  deconditioning   Wt is 246 lbs   Had an unremarkable event monitor in 2021   Has rare near syncopal episodes    Aug. 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 )   This caused some generalized weakness and orthostasis   Has episodes of lightheadedness several times a week  Occurs with sitting or standing  Unpredictable  Spells might last 10 min BP readings after the episode appears to be normal   May be related to volume depletion  Has been trying to drink more water   Does not drink ETOH   Has been sedintary since the bladder issue   Also goes to PT , exercises with them without any "spells"     Past Medical History:  Diagnosis Date   Cancer of floor of mouth (HCC) 2007    Cervical spondylosis    Depression    FH: alcoholism    H/O leukocytosis    leukocytosis and erythrocytosis   Nuclear sclerotic cataract of both eyes 10/31/2020   Prostate cancer (HCC)    S/P radiation therapy 02/23/2015 through 04/21/2015     Prostate 7800 cGy in 40 sessions, seminal vesicles, and pelvic lymph nodes 5600 cGy in 40 sessions                         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   atorvastatin (LIPITOR) 20 MG tablet Take 1 tablet (20 mg total) by mouth daily.   Cholecalciferol (VITAMIN D3) 10 MCG (400 UNIT) tablet Take 400 Units by mouth daily.   ezetimibe (ZETIA) 10 MG tablet Take 1 tablet (10 mg total) by mouth daily.   fluocinonide (LIDEX) 0.05 % external solution as needed (irritation).   furosemide (LASIX) 20 MG tablet Take 1 tablet by mouth daily.   Glucosamine-Chondroitin (GLUCOSAMINE CHONDR COMPLEX PO) Take 1 tablet by mouth daily in the afternoon.   ibuprofen (ADVIL) 200 MG tablet Take 1 tablet by mouth every 6 (six) hours as needed.   losartan (COZAAR) 100 MG tablet TAKE ONE TABLET BY MOUTH DAILY in afternoon   Multiple Vitamin (MULTIVITAMIN) tablet Take 1 tablet by mouth daily.   Multiple Vitamins-Minerals (ONE DAILY CALCIUM/IRON) TABS Take 1 tablet by mouth daily.   Niacin (VITAMIN B-3 PO) Take 1 tablet by mouth daily in the afternoon.   [DISCONTINUED] amLODipine (NORVASC) 5 MG tablet TAKE ONE TABLET BY MOUTH DAILY     Allergies:   Bupropion, Candesartan cilexetil, and Leuprolide   Social History   Socioeconomic History   Marital status: Divorced    Spouse name: Not on file   Number of children: Not on file   Years of education: Not on file    Highest education level: Not on file  Occupational History   Not on file  Tobacco Use   Smoking status: Every Day   Smokeless tobacco: Current   Tobacco comments:    frequency  1 PPD . Smoking : smoking electric cigaretes . Alcohol : no     Substance and Sexual Activity   Alcohol use: No    Alcohol/week: 0.0 standard drinks of alcohol   Drug use: No   Sexual activity: Not on file  Other Topics Concern   Not on file  Social History Narrative   History of smoking cigarettes :Current smoker,   Frequency : 1 PPD Smoking: smoking electric  cigaretes. Alcohol: no.   Social Determinants of Health   Financial Resource Strain: Not on file  Food Insecurity: Low Risk  (05/02/2023)   Received from [facility]   Food vital sign    Within the past 12 months, you worried that your food would run out before you got money to buy more: Never true    Within the past 12 months, the food you bought just didn't last and you didn't have money to get more. : Never true  Transportation Needs: Not on file (05/02/2023)  Physical Activity: Not on file  Stress: Not on file  Social Connections: Not on file     Family History: The patient's family history includes Colon cancer in an other family member; Testicular cancer in his paternal uncle.  ROS:   Please see the history of present illness.     All other systems reviewed and are negative.  EKGs/Labs/Other Studies Reviewed:    The following studies were reviewed today:      Recent Labs: 02/20/2023: B Natriuretic Peptide 76.9; BUN 18; Creatinine, Ser 1.13; Hemoglobin 13.3; Magnesium 1.9; Platelets 210; Potassium 3.8; Sodium 137 03/29/2023: ALT 16  Recent Lipid Panel    Component Value Date/Time   CHOL 214 (H) 03/29/2023 0836   TRIG 76 03/29/2023 0836   HDL 47 03/29/2023 0836   CHOLHDL 4.6 03/29/2023 0836   LDLCALC 153 (H) 03/29/2023 0836    Physical Exam:      Physical Exam: Blood pressure 132/62, pulse 73, height 5\' 8"  (1.727 m),  weight 224 lb (101.6 kg), SpO2 95%.       GEN:  Well nourished, well developed in no acute distress HEENT: Normal NECK: No JVD; No carotid bruits LYMPHATICS: No lymphadenopathy CARDIAC: RRR , no murmurs, rubs, gallops RESPIRATORY:  Clear to auscultation without rales, wheezing or rhonchi  ABDOMEN: Soft, non-tender, non-distended MUSCULOSKELETAL:  1-2+ pitting edema , Left > right  No deformity  SKIN: Warm and dry NEUROLOGIC:  Alert and oriented x 3   EKG:     Feb 20, 2023: Sinus bradycardia.  No ST or T wave changes.   ASSESSMENT:    1. Essential hypertension   2. Pre-syncope       PLAN:       1.  Hypertension:  well controlled.  Still eating some additional salt   2.  Hyperlipidemia:     . 3.  Shortness of breath with exertion.      4.  Daytime sleepiness.    ?   OSA  5.  Leg edema :   gave instructions on Lounge doctor leg rest.   Encouraged him to use compression hose   6.  Near syncope :   will get a 14 day monitor .  Repeat echo ,  carotid duplex scan  Reduced his amlodipine to 2.5 mg a day  Advised him to eat and drink more often .   Follow up in 3 months   Medication Adjustments/Labs and Tests Ordered: Current medicines are reviewed at length with the patient  today.  Concerns regarding medicines are outlined above.  Orders Placed This Encounter  Procedures   LONG TERM MONITOR (3-14 DAYS)   ECHOCARDIOGRAM COMPLETE   VAS US CAROTID   Meds ordered this encounter  Medications   amLODipine (NORVASC) 2.5 MG tablet    Sig: Take 1 tablet (2.5 mg total) by mouth daily.    Dispense:  90 tablet    Refill:  3    Dose DECREASE      Patient Instructions  Medication Instructions:  DECREASE Amlodipine to 2.5mg  daily *If you need a refill on your cardiac medications before your next appointment, please call your pharmacy*   Lab Work: NONE If you have labs (blood work) drawn today and your tests are completely normal, you will receive your results  only by: MyChart Message (if you have MyChart) OR A paper copy in the mail If you have any lab test that is abnormal or we need to change your treatment, we will call you to review the results.  Testing/Procedures: 14-day Zio monitor Your physician has recommended that you wear an event monitor. Event monitors are medical devices that record the heart's electrical activity. Doctors most often [place] these monitors to diagnose arrhythmias. Arrhythmias are problems with the speed or rhythm of the heartbeat. The monitor is a small, portable device. You can wear one while you do your normal daily activities. This is usually used to diagnose what is causing palpitations/syncope (passing out).  ECHO Your physician has requested that you have an echocardiogram. Echocardiography is a painless test that uses sound waves to create images of your heart. It provides your doctor with information about the size and shape of your heart and how well your heart's chambers and valves are working. This procedure takes approximately one hour. There are no restrictions for this procedure. Please do NOT wear cologne, perfume, aftershave, or lotions (deodorant is allowed). Please arrive 15 minutes prior to your appointment time.  Carotid Ultrasound Your physician has requested that you have a carotid duplex. This test is an ultrasound of the carotid arteries in your neck. It looks at blood flow through these arteries that supply the brain with blood. Allow one hour for this exam. There are no restrictions or special instructions.  Follow-Up: At [facility], you and your health needs are our priority.  As part of our continuing mission to provide you with exceptional heart care, we have created designated Provider Care Teams.  These Care Teams include your primary Cardiologist (physician) and Advanced Practice Providers (APPs -  Physician Assistants and Nurse Practitioners) who all work together to provide you with  the care you need, when you need it.  Your next appointment:   3 month(s)  Provider:   Kristeen Miss, MD       Other Instructions **Try three small meals, instead of 2 large meals per day to see if helps with symptoms as some of this could be related to intake or dehydration**   For your  leg edema you  should do  the following 1. Leg elevation - I recommend the Lounge Dr. Leg rest.  See below for details  2. Salt restriction  -  Use potassium chloride instead of regular salt as a salt substitute. 3. Walk regularly 4. Compression hose - Medical Supply store  5. Weight loss    Available on [email or web address] Or  Go to [email or web address]       Signed, Kristeen Miss, MD  05/13/2023 6:55 PM  [facility]

## 2023-05-13 ENCOUNTER — Encounter: Payer: Self-pay | Admitting: Cardiovascular Disease

## 2023-05-13 ENCOUNTER — Ambulatory Visit: Payer: Medicare HMO | Attending: Cardiovascular Disease | Admitting: Cardiovascular Disease

## 2023-05-13 ENCOUNTER — Ambulatory Visit: Payer: Medicare HMO | Attending: Cardiovascular Disease

## 2023-05-13 VITALS — BP 132/62 | HR 73 | Ht 68.0 in | Wt 224.0 lb

## 2023-05-13 DIAGNOSIS — R55 Syncope and collapse: Secondary | ICD-10-CM | POA: Diagnosis not present

## 2023-05-13 DIAGNOSIS — I10 Essential (primary) hypertension: Secondary | ICD-10-CM

## 2023-05-13 MED ORDER — AMLODIPINE BESYLATE 2.5 MG PO TABS
2.5000 mg | ORAL_TABLET | Freq: Every day | ORAL | 3 refills | Status: AC
Start: 2023-05-13 — End: ?

## 2023-05-13 NOTE — Progress Notes (Unsigned)
Enrolled for [employer] to mail a ZIO XT long term holter monitor to the patients address on file.  

## 2023-05-13 NOTE — Patient Instructions (Addendum)
Medication Instructions:  DECREASE Amlodipine to 2.5mg  daily *If you need a refill on your cardiac medications before your next appointment, please call your pharmacy*   Lab Work: NONE If you have labs (blood work) drawn today and your tests are completely normal, you will receive your results only by: MyChart Message (if you have MyChart) OR A paper copy in the mail If you have any lab test that is abnormal or we need to change your treatment, we will call you to review the results.  Testing/Procedures: 14-day Zio monitor Your physician has recommended that you wear an event monitor. Event monitors are medical devices that record the heart's electrical activity. Doctors most often [place] these monitors to diagnose arrhythmias. Arrhythmias are problems with the speed or rhythm of the heartbeat. The monitor is a small, portable device. You can wear one while you do your normal daily activities. This is usually used to diagnose what is causing palpitations/syncope (passing out).  ECHO Your physician has requested that you have an echocardiogram. Echocardiography is a painless test that uses sound waves to create images of your heart. It provides your doctor with information about the size and shape of your heart and how well your heart's chambers and valves are working. This procedure takes approximately one hour. There are no restrictions for this procedure. Please do NOT wear cologne, perfume, aftershave, or lotions (deodorant is allowed). Please arrive 15 minutes prior to your appointment time.  Carotid Ultrasound Your physician has requested that you have a carotid duplex. This test is an ultrasound of the carotid arteries in your neck. It looks at blood flow through these arteries that supply the brain with blood. Allow one hour for this exam. There are no restrictions or special instructions.  Follow-Up: At [facility], you and your health needs are our priority.  As part of our  continuing mission to provide you with exceptional heart care, we have created designated Provider Care Teams.  These Care Teams include your primary Cardiologist (physician) and Advanced Practice Providers (APPs -  Physician Assistants and Nurse Practitioners) who all work together to provide you with the care you need, when you need it.  Your next appointment:   3 month(s)  Provider:   Kristeen Miss, MD       Other Instructions **Try three small meals, instead of 2 large meals per day to see if helps with symptoms as some of this could be related to intake or dehydration**   For your  leg edema you  should do  the following 1. Leg elevation - I recommend the Lounge Dr. Leg rest.  See below for details  2. Salt restriction  -  Use potassium chloride instead of regular salt as a salt substitute. 3. Walk regularly 4. Compression hose - Medical Supply store  5. Weight loss    Available on [email or web address] Or  Go to [email or web address]

## 2023-05-15 DIAGNOSIS — M545 Low back pain, unspecified: Secondary | ICD-10-CM | POA: Diagnosis not present

## 2023-05-15 DIAGNOSIS — S39012D Strain of muscle, fascia and tendon of lower back, subsequent encounter: Secondary | ICD-10-CM | POA: Diagnosis not present

## 2023-05-17 DIAGNOSIS — S39012D Strain of muscle, fascia and tendon of lower back, subsequent encounter: Secondary | ICD-10-CM | POA: Diagnosis not present

## 2023-05-17 DIAGNOSIS — M545 Low back pain, unspecified: Secondary | ICD-10-CM | POA: Diagnosis not present

## 2023-05-20 DIAGNOSIS — R55 Syncope and collapse: Secondary | ICD-10-CM

## 2023-05-20 DIAGNOSIS — I10 Essential (primary) hypertension: Secondary | ICD-10-CM | POA: Diagnosis not present

## 2023-05-24 DIAGNOSIS — M545 Low back pain, unspecified: Secondary | ICD-10-CM | POA: Diagnosis not present

## 2023-05-24 DIAGNOSIS — S39012D Strain of muscle, fascia and tendon of lower back, subsequent encounter: Secondary | ICD-10-CM | POA: Diagnosis not present

## 2023-05-27 ENCOUNTER — Ambulatory Visit (HOSPITAL_COMMUNITY)
Admission: RE | Admit: 2023-05-27 | Discharge: 2023-05-27 | Disposition: A | Discharge status: Home / Self Care | Payer: Medicare HMO | Source: Ambulatory Visit | Attending: Cardiovascular Disease | Admitting: Cardiovascular Disease

## 2023-05-27 ENCOUNTER — Other Ambulatory Visit: Payer: Self-pay | Admitting: Nurse Practitioner

## 2023-05-27 DIAGNOSIS — I10 Essential (primary) hypertension: Secondary | ICD-10-CM | POA: Insufficient documentation

## 2023-05-27 DIAGNOSIS — R55 Syncope and collapse: Secondary | ICD-10-CM | POA: Insufficient documentation

## 2023-05-29 ENCOUNTER — Telehealth: Payer: Self-pay

## 2023-05-29 ENCOUNTER — Ambulatory Visit (HOSPITAL_COMMUNITY): Payer: Medicare HMO | Attending: Internal Medicine

## 2023-05-29 ENCOUNTER — Other Ambulatory Visit: Payer: Self-pay

## 2023-05-29 ENCOUNTER — Ambulatory Visit: Payer: Medicare HMO

## 2023-05-29 DIAGNOSIS — I10 Essential (primary) hypertension: Secondary | ICD-10-CM | POA: Diagnosis not present

## 2023-05-29 DIAGNOSIS — E782 Mixed hyperlipidemia: Secondary | ICD-10-CM

## 2023-05-29 DIAGNOSIS — R55 Syncope and collapse: Secondary | ICD-10-CM | POA: Diagnosis not present

## 2023-05-29 LAB — ECHOCARDIOGRAM COMPLETE
Area-P 1/2: 2.09 cm2
S' Lateral: 2.8 cm

## 2023-05-29 NOTE — Telephone Encounter (Signed)
Spoke with the patient and he states he received a mychart notification stating he needed to have labs today. patient states his appointment was originally scheduled for Friday, but someone up front told him he could do labs while he was here today and changed his appointment.   He states after his ECHO he went back to the labs but was turned away.   I apologized for the confusion, and asked the patient if there was a day that work best for him to come back and have labs drawn. Patient states he can come next Monday or Friday. I advised the patient that next Monday is a holiday and offered Friday instead. Patient agreeable and lab appt moved to Sept 6.   Patient agreeable and voiced understanding.

## 2023-05-30 DIAGNOSIS — K5730 Diverticulosis of large intestine without perforation or abscess without bleeding: Secondary | ICD-10-CM | POA: Diagnosis not present

## 2023-05-30 DIAGNOSIS — K2289 Other specified disease of esophagus: Secondary | ICD-10-CM | POA: Diagnosis not present

## 2023-05-30 DIAGNOSIS — K3189 Other diseases of stomach and duodenum: Secondary | ICD-10-CM | POA: Diagnosis not present

## 2023-05-30 DIAGNOSIS — R1310 Dysphagia, unspecified: Secondary | ICD-10-CM | POA: Diagnosis not present

## 2023-05-30 DIAGNOSIS — R933 Abnormal findings on diagnostic imaging of other parts of digestive tract: Secondary | ICD-10-CM | POA: Diagnosis not present

## 2023-05-30 DIAGNOSIS — K648 Other hemorrhoids: Secondary | ICD-10-CM | POA: Diagnosis not present

## 2023-05-30 DIAGNOSIS — K2930 Chronic superficial gastritis without bleeding: Secondary | ICD-10-CM | POA: Diagnosis not present

## 2023-05-31 ENCOUNTER — Ambulatory Visit: Payer: Medicare HMO

## 2023-06-01 ENCOUNTER — Emergency Department (HOSPITAL_COMMUNITY)
Admission: EM | Admit: 2023-06-01 | Payer: Medicare HMO | Source: Home / Self Care | Attending: Emergency Medicine | Admitting: Emergency Medicine

## 2023-06-04 DIAGNOSIS — K2289 Other specified disease of esophagus: Secondary | ICD-10-CM | POA: Diagnosis not present

## 2023-06-04 DIAGNOSIS — K2930 Chronic superficial gastritis without bleeding: Secondary | ICD-10-CM | POA: Diagnosis not present

## 2023-06-05 DIAGNOSIS — M62830 Muscle spasm of back: Secondary | ICD-10-CM | POA: Diagnosis not present

## 2023-06-05 DIAGNOSIS — S39012D Strain of muscle, fascia and tendon of lower back, subsequent encounter: Secondary | ICD-10-CM | POA: Diagnosis not present

## 2023-06-05 DIAGNOSIS — M545 Low back pain, unspecified: Secondary | ICD-10-CM | POA: Diagnosis not present

## 2023-06-05 DIAGNOSIS — R21 Rash and other nonspecific skin eruption: Secondary | ICD-10-CM | POA: Diagnosis not present

## 2023-06-06 DIAGNOSIS — I10 Essential (primary) hypertension: Secondary | ICD-10-CM | POA: Diagnosis not present

## 2023-06-06 DIAGNOSIS — R55 Syncope and collapse: Secondary | ICD-10-CM | POA: Diagnosis not present

## 2023-06-07 ENCOUNTER — Ambulatory Visit: Payer: Medicare HMO | Attending: Internal Medicine

## 2023-06-10 ENCOUNTER — Emergency Department (HOSPITAL_COMMUNITY)
Admission: EM | Admit: 2023-06-10 | Discharge: 2023-06-10 | Disposition: A | Discharge status: Home / Self Care | Payer: Medicare HMO | Attending: Emergency Medicine | Admitting: Emergency Medicine

## 2023-06-10 DIAGNOSIS — I2510 Atherosclerotic heart disease of native coronary artery without angina pectoris: Secondary | ICD-10-CM | POA: Diagnosis not present

## 2023-06-10 DIAGNOSIS — R339 Retention of urine, unspecified: Secondary | ICD-10-CM | POA: Diagnosis not present

## 2023-06-10 NOTE — Discharge Instructions (Signed)
Glad symptoms resolved on their own. Follow-up with urology-- attached information for [facility] if you want to re-establish with their office for easier follow-up.

## 2023-06-10 NOTE — ED Provider Notes (Signed)
[place] EMERGENCY DEPARTMENT AT [facility] Provider Note   CSN: [id number] Arrival date & time: 06/10/23  4098     History  Chief Complaint  Patient presents with   Urinary Retention    Theodore Lamb is a 77 y.o. male.  The history is provided by the patient and medical records.   77 year old male with history of prostate cancer status post radiation, coronary artery disease, hyperlipidemia, depression, presenting to the ED with urinary retention for the last hour and a half.  Once he arrived in the ED he felt the urge to have a bowel movement and was able to urinate successfully.  Reports urinating a large amount.  He no longer has any abdominal fullness or pain.  He denies any recent dysuria, fever, chills, or flank pain.  Home Medications Prior to Admission medications   Medication Sig Start Date End Date Taking? Authorizing Provider  amLODipine (NORVASC) 2.5 MG tablet Take 1 tablet (2.5 mg total) by mouth daily. 05/13/23   Nahser, Deloris Ping, MD  atorvastatin (LIPITOR) 20 MG tablet Take 1 tablet (20 mg total) by mouth daily. 04/05/23 05/13/23  Gaston Islam., NP  Cholecalciferol (VITAMIN D3) 10 MCG (400 UNIT) tablet Take 400 Units by mouth daily.    [provider]  ezetimibe (ZETIA) 10 MG tablet Take 1 tablet (10 mg total) by mouth daily. 02/01/23   Gaston Islam., NP  fluocinonide (LIDEX) 0.05 % external solution as needed (irritation). 12/31/22   [provider]  furosemide (LASIX) 20 MG tablet Take 1 tablet by mouth daily. 04/18/23   [provider]  Glucosamine-Chondroitin (GLUCOSAMINE CHONDR COMPLEX PO) Take 1 tablet by mouth daily in the afternoon.    [provider]  ibuprofen (ADVIL) 200 MG tablet Take 1 tablet by mouth every 6 (six) hours as needed.    [provider]  losartan (COZAAR) 100 MG tablet Take 1 tablet (100 mg total) by mouth daily. 05/27/23   Nahser, Deloris Ping, MD  Multiple Vitamin (MULTIVITAMIN)  tablet Take 1 tablet by mouth daily.    [provider]  Multiple Vitamins-Minerals (ONE DAILY CALCIUM/IRON) TABS Take 1 tablet by mouth daily.    [provider]  Niacin (VITAMIN B-3 PO) Take 1 tablet by mouth daily in the afternoon.    [provider]      Allergies    Bupropion, Candesartan cilexetil, and Leuprolide    Review of Systems   Review of Systems  Genitourinary:  Positive for difficulty urinating.  All other systems reviewed and are negative.   Physical Exam Updated Vital Signs BP (!) 143/85 (BP Location: Right Arm)   Pulse 66   Temp 98.3 F (36.8 C) (Oral)   Resp 18   SpO2 98%   Physical Exam Vitals and nursing note reviewed.  Constitutional:      Appearance: He is well-developed.  HENT:     Head: Normocephalic and atraumatic.  Eyes:     Conjunctiva/sclera: Conjunctivae normal.     Pupils: Pupils are equal, round, and reactive to light.  Cardiovascular:     Rate and Rhythm: Normal rate and regular rhythm.     Heart sounds: Normal heart sounds.  Pulmonary:     Effort: Pulmonary effort is normal. No respiratory distress.     Breath sounds: Normal breath sounds. No rhonchi.  Abdominal:     General: Bowel sounds are normal.     Palpations: Abdomen is soft.  Comments: Soft, non-tender, no bladder distention noted  Musculoskeletal:        General: Normal range of motion.     Cervical back: Normal range of motion.  Skin:    General: Skin is warm and dry.  Neurological:     Mental Status: He is alert and oriented to person, place, and time.     ED Results / Procedures / Treatments   Labs (all labs ordered are listed, but only abnormal results are displayed) Labs Reviewed - No data to display  EKG None  Radiology No results found.  Procedures Procedures    Medications Ordered in ED Medications - No data to display  ED Course/ Medical Decision Making/ A&P                                 Medical Decision  Making  77 year old male here with urinary retention for the last hour and a half.  Was able to urinate in the lobby and now is asymptomatic.  He denies any recent dysuria, fever, chills, or flank pain.  Patient feels fine now and would like to leave.  I have offered him bladder scan and/or UA to ensure he does not have any residual retained urine or acute signs of infection, he declined and states he is fairly confident he is "empty".  He does have a urologist that he can follow-up with, however has been having some issues getting appointments recently so is thinking about switching back to local urologist here in [place].  He was given office information for [facility].  He can return here for any new or acute changes.  Final Clinical Impression(s) / ED Diagnoses Final diagnoses:  Urinary retention    Rx / DC Orders ED Discharge Orders     None         Garlon Hatchet, PA-C 06/10/23 0429    Palumbo, April, MD 06/10/23 [phone]

## 2023-06-10 NOTE — ED Triage Notes (Signed)
Pt reports from home, had urinary retention x1.5 hours PTA , pt reports urinated here, and wants to leave, pt has urologist and has been seen for the same. Pt reports pain is better and bladder now empty

## 2023-06-11 ENCOUNTER — Encounter (INDEPENDENT_AMBULATORY_CARE_PROVIDER_SITE_OTHER): Payer: Medicare HMO | Admitting: Ophthalmology

## 2023-06-11 ENCOUNTER — Encounter (INDEPENDENT_AMBULATORY_CARE_PROVIDER_SITE_OTHER): Payer: Self-pay

## 2023-06-11 DIAGNOSIS — R9341 Abnormal radiologic findings on diagnostic imaging of renal pelvis, ureter, or bladder: Secondary | ICD-10-CM | POA: Diagnosis not present

## 2023-06-11 DIAGNOSIS — Z8719 Personal history of other diseases of the digestive system: Secondary | ICD-10-CM | POA: Diagnosis not present

## 2023-06-11 DIAGNOSIS — Z8546 Personal history of malignant neoplasm of prostate: Secondary | ICD-10-CM | POA: Diagnosis not present

## 2023-06-11 DIAGNOSIS — Z87898 Personal history of other specified conditions: Secondary | ICD-10-CM | POA: Diagnosis not present

## 2023-06-13 ENCOUNTER — Telehealth: Payer: Self-pay | Admitting: Cardiovascular Disease

## 2023-06-13 MED ORDER — ATORVASTATIN CALCIUM 20 MG PO TABS: 20.0000 mg | ORAL_TABLET | Freq: Every day | ORAL | 2 refills | Status: DC

## 2023-06-13 MED ORDER — EZETIMIBE 10 MG PO TABS: 10.0000 mg | ORAL_TABLET | Freq: Every day | ORAL | 2 refills | Status: DC

## 2023-06-13 NOTE — Telephone Encounter (Addendum)
Patient has been notified directly; all questions, if any, were answered. Patient voiced understanding.   Patient states he will get back on track and take both meds.  Rx(s) sent to pharmacy electronically.

## 2023-06-13 NOTE — Telephone Encounter (Signed)
Patient is returning call to discuss monitor results.

## 2023-06-13 NOTE — Telephone Encounter (Signed)
Further discussion w patient  -  his Lipitor prescription had an expiration date of 05/13/23.  He is almost out of Lipitor, does not have Zetia and seemed unaware he was to take it.  The note on Zetia says it was replaced w Lipitor.  Mix up w his lab appointment as well.  Has only been taking Lipitor regularly for 1-2 weeks.    I will forward to Alden Server and primary covering to verify which medications patient should be taking and when he will need labs.  Will need new rx for Lipitor and would prefer 90 day supply.

## 2023-06-13 NOTE — Addendum Note (Signed)
Addended by: Alveta Heimlich on: 06/13/2023 12:33 PM   Modules accepted: Orders

## 2023-06-13 NOTE — Telephone Encounter (Signed)
Returned call to patient.  Dr. Elease Hashimoto called the patient and reviewed the results.   No other needs at this time.

## 2023-06-13 NOTE — Telephone Encounter (Signed)
Gaston Islam., NP  Lendon Ka, RN6 minutes ago (12:16 PM)    Yes he should be taking ezetimibe and Lipitor due to elevation in total cholesterol and LDL.  Please order 90-day supply for both medicines.  If he is not willing to take ezetimibe that is fine but he should be taking Lipitor daily.  Please let me know if you have any questions.  Robin Searing, NP

## 2023-08-12 ENCOUNTER — Encounter: Payer: Self-pay | Admitting: Cardiovascular Disease

## 2023-08-12 NOTE — Progress Notes (Unsigned)
Cardiology Office Note:    Date:  08/13/2023   ID:  Theodore Lamb, DOB 01-Aug-1946, MRN [id number]  PCP:  Daisy Floro, MD  [facility] Cardiologist:  Mellany Dinsmore  [facility] Electrophysiologist:  None   Referring MD: Daisy Floro, MD   Chief Complaint  Patient presents with   Hypertension        Hyperlipidemia     Sept. 7, 2021   Theodore Lamb is a 77 y.o. male with a hx of  Palpitations and blurred vision. We were asked to see him today by Dr. Zachery Dauer for further evaluation of his palpitations.   Has been having unpredictable spells for the past 3-4 weeks.  Occurs spontaneously,   Not preceded by anything in particular  May be associated with eating  - several have occurred 30 min after eating .  Works around his house.   Does not exercise per se.   Does not walk much  Is limited by back pain .  The spells will last 15-30 min.  Has mild episodes that start with mild mid sternal pressure,  A flutter sensation, mild heart burn,  And the sensation that he needs to take more frequent breaths . Som bad episodes cause some vision changes, nausea , hands and feet numbness.  Assoc with tightness in his throat .   Checks his Omron BP cuff and his VS are normal .   Eats and drinks regularly .  Not associated with syncope or presyncope  The symptoms dissapate over 1-2 minutes and then he feels well .   Has been seen by Dr. Donnie Aho in the past  Has worn a monitor and did an echo  Was told his echo was fine .   December 09, 2020:   Theodore Lamb is seen today for follow up of his palpitations,  Moderate CAD , HLD   CCTA  in Oct. 2021 reveals : Coronary calcium score: The patient's coronary artery calcium score is 438, which places the patient in the 46 percentile. 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    December 11, 2021: Theodore Lamb is seen for follow up of his palpitations, moderate CAD, HLD  Eats bacon regularly ,  might explain his BP elevation  Does not eat fast food  Wt is 247 lbs.    Sept. 15, 2023 Theodore Lamb is seen for follow up of his palpitations, moderte CAD , HLD  Is vaping .   , not smoking    No CP ,  Palpitations  Is not exercising regularly  , tore his R knee meniscus Coronary CTA:  IMPRESSION: 1. Moderate CAD, CADRADS = 3. Given moderate disease, CT FFR will be performed and reported separately.   2. Coronary calcium score of 438. This was 71 percentile for age and sex matched control.  I suspect he has some deconditioning   Advised more exercise Visit with ortho or sports medicine dr to work on knee pain    Dec. 4, 2023  Theodore Lamb is seen for follow up of is HLD,  Echo  from Sept 28, 2023 Normal LV function Trivial MR  No structural cardiac issues to explain  his DOE Likely deconditioning   Wt is 246 lbs   Had an unremarkable event monitor in 2021   Has rare near syncopal episodes    Aug. 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 12, 2024  Theodore Lamb is seen for follow up of his HTN,  Episodes of near syncope   Wt is 228 lbs  No CP No syncope  Occasional palpitations   Has not been using his CPAP  Sees [facility] . 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   atorvastatin (LIPITOR) 20 MG tablet Take 1 tablet (20 mg total) by mouth daily.   ezetimibe (ZETIA) 10 MG tablet Take 1 tablet (10 mg total) by mouth daily.   fluocinonide (LIDEX) 0.05 % external solution as needed (irritation).   ibuprofen (ADVIL) 200 MG tablet Take 1 tablet by mouth every 6 (six) hours as needed.   losartan (COZAAR) 100 MG tablet Take 1 tablet (100 mg total) by mouth daily.     Allergies:   Bupropion, Candesartan cilexetil, and Leuprolide   Social History   Socioeconomic History   Marital status: Divorced    Spouse name: Not on file   Number of children: Not on file   Years of education: Not on file   Highest education level: Not on file  Occupational History   Not on file  Tobacco Use   Smoking status: Every Day   Smokeless tobacco: Current   Tobacco comments:    frequency  1 PPD . Smoking : smoking electric cigaretes . Alcohol : no     Substance and Sexual Activity   Alcohol use: No    Alcohol/week: 0.0 standard drinks of  alcohol   Drug use: No   Sexual  activity: Not on file  Other Topics Concern   Not on file  Social History Narrative   History of smoking cigarettes :Current smoker,   Frequency : 1 PPD Smoking: smoking electric  cigaretes. Alcohol: no.   Social Determinants of Health   Financial Resource Strain: Not on file  Food Insecurity: Low Risk  (06/11/2023)   Received from [facility]   Hunger Vital Sign    Worried About Running Out of Food in the Last Year: Never true    Ran Out of Food in the Last Year: Never true  Transportation Needs: No Transportation Needs (06/11/2023)   Received from [facility]    In the past 12 months, has lack of reliable transportation kept you from medical appointments, meetings, work or from getting things needed for daily living? : No  Physical Activity: Not on file  Stress: Not on file  Social Connections: Not on file     Family History: The patient's family history includes Colon cancer in an other family member; Testicular cancer in his paternal uncle.  ROS:   Please see the history of present illness.     All other systems reviewed and are negative.  EKGs/Labs/Other Studies Reviewed:    The following studies were reviewed today:      Recent Labs: 02/20/2023: B Natriuretic Peptide 76.9; BUN 18; Creatinine, Ser 1.13; Hemoglobin 13.3; Magnesium 1.9; Platelets 210; Potassium 3.8; Sodium 137 03/29/2023: ALT 16  Recent Lipid Panel    Component Value Date/Time   CHOL 214 (H) 03/29/2023 0836   TRIG 76 03/29/2023 0836   HDL 47 03/29/2023 0836   CHOLHDL 4.6 03/29/2023 0836   LDLCALC 153 (H) 03/29/2023 0836    Physical Exam:        Physical Exam: Blood pressure 134/64, pulse 65, height 5\' 8"  (1.727 m), weight 228 lb 9.6 oz (103.7 kg), SpO2 95%.       GEN:  elderly male in no acute distress HEENT: Normal NECK: No JVD; No carotid bruits LYMPHATICS: No lymphadenopathy CARDIAC: RRR , no murmurs, rubs,  gallops RESPIRATORY:  Clear to auscultation without rales, wheezing or rhonchi  ABDOMEN: Soft, non-tender, non-distended MUSCULOSKELETAL:  No edema; No deformity  SKIN: Warm and dry NEUROLOGIC:  Alert and oriented x 3   EKG:        ASSESSMENT:    1. Mixed hyperlipidemia   2. Essential hypertension   3. OSA (obstructive sleep apnea)   4. Nonobstructive atherosclerosis of coronary artery        PLAN:       1.  Hypertension:   BP is well controlled.    2.  Hyperlipidemia:    his last LDL is 153.   Was not taking his atorvastatin .  Will recheck today  I would like his LDL to be < 70    . 3.  Shortness of breath with exertion.      4.  Daytime sleepiness.    ?   OSA,  has not been using his CPAP   5.  Leg edema :      6.  Near syncope :    no recent episodes of syncope      Medication Adjustments/Labs and Tests Ordered: Current medicines are reviewed at length with the patient today.  Concerns regarding medicines are outlined above.  Orders Placed This Encounter  Procedures   ALT   Lipid panel   Basic metabolic panel   No orders of the defined types  were placed in this encounter.     Patient Instructions  Lab Work: Lipids, ALT, BMET today If you have labs (blood work) drawn today and your tests are completely normal, you will receive your results only by: MyChart Message (if you have MyChart) OR A paper copy in the mail If you have any lab test that is abnormal or we need to change your treatment, we will call you to review the results.  Follow-Up: At [facility], you and your health needs are our priority.  As part of our continuing mission to provide you with exceptional heart care, we have created designated Provider Care Teams.  These Care Teams include your primary Cardiologist (physician) and Advanced Practice Providers (APPs -  Physician Assistants and Nurse Practitioners) who all work together to provide you with the care you need, when  you need it.  Your next appointment:   1 year(s)  Provider:   Kristeen Miss, MD        Signed, Kristeen Miss, MD  08/13/2023 10:07 AM    [facility]

## 2023-08-13 ENCOUNTER — Encounter: Payer: Self-pay | Admitting: Cardiovascular Disease

## 2023-08-13 ENCOUNTER — Ambulatory Visit: Payer: Medicare HMO | Attending: Cardiovascular Disease | Admitting: Cardiovascular Disease

## 2023-08-13 VITALS — BP 134/64 | HR 65 | Ht 68.0 in | Wt 228.6 lb

## 2023-08-13 DIAGNOSIS — I10 Essential (primary) hypertension: Secondary | ICD-10-CM | POA: Diagnosis not present

## 2023-08-13 DIAGNOSIS — E782 Mixed hyperlipidemia: Secondary | ICD-10-CM

## 2023-08-13 DIAGNOSIS — G4733 Obstructive sleep apnea (adult) (pediatric): Secondary | ICD-10-CM | POA: Diagnosis not present

## 2023-08-13 DIAGNOSIS — I2510 Atherosclerotic heart disease of native coronary artery without angina pectoris: Secondary | ICD-10-CM | POA: Diagnosis not present

## 2023-08-13 NOTE — Patient Instructions (Signed)
 Lab Work: Lipids, ALT, BMET today If you have labs (blood work) drawn today and your tests are completely normal, you will receive your results only by: MyChart Message (if you have MyChart) OR A paper copy in the mail If you have any lab test that is abnormal or we need to change your treatment, we will call you to review the results.  Follow-Up: At [facility], you and your health needs are our priority.  As part of our continuing mission to provide you with exceptional heart care, we have created designated Provider Care Teams.  These Care Teams include your primary Cardiologist (physician) and Advanced Practice Providers (APPs -  Physician Assistants and Nurse Practitioners) who all work together to provide you with the care you need, when you need it.  Your next appointment:   1 year(s)  Provider:   Kristeen Miss, MD

## 2023-08-14 LAB — LIPID PANEL
Chol/HDL Ratio: 3 ratio (ref 0.0–5.0)
Cholesterol, Total: 133 mg/dL (ref 100–199)
HDL: 44 mg/dL (ref >39)
LDL Chol Calc (NIH): 71 mg/dL (ref 0–99)
Triglycerides: 95 mg/dL (ref 0–149)
VLDL Cholesterol Cal: 18 mg/dL (ref 5–40)

## 2023-08-14 LAB — ALT: ALT: 15 [IU]/L (ref 0–44)

## 2023-08-14 LAB — BASIC METABOLIC PANEL
BUN/Creatinine Ratio: 18 (ref 10–24)
BUN: 23 mg/dL (ref 8–27)
CO2: 26 mmol/L (ref 20–29)
Calcium: 9.5 mg/dL (ref 8.6–10.2)
Chloride: 104 mmol/L (ref 96–106)
Creatinine, Ser: 1.26 mg/dL (ref 0.76–1.27)
Glucose: 95 mg/dL (ref 70–99)
Potassium: 4.5 mmol/L (ref 3.5–5.2)
Sodium: 141 mmol/L (ref 134–144)
eGFR: 58 mL/min/{1.73_m2} — ABNORMAL LOW (ref >59)

## 2023-08-15 ENCOUNTER — Telehealth: Payer: Self-pay | Admitting: Cardiovascular Disease

## 2023-08-15 NOTE — Telephone Encounter (Signed)
 Pt is returning call to nurse for results

## 2023-09-13 ENCOUNTER — Encounter (HOSPITAL_COMMUNITY): Payer: Self-pay

## 2023-09-13 ENCOUNTER — Emergency Department (HOSPITAL_COMMUNITY): Payer: Medicare HMO

## 2023-09-13 ENCOUNTER — Other Ambulatory Visit: Payer: Self-pay

## 2023-09-13 ENCOUNTER — Emergency Department (HOSPITAL_COMMUNITY)
Admission: EM | Admit: 2023-09-13 | Discharge: 2023-09-13 | Disposition: A | Discharge status: Home / Self Care | Payer: Medicare HMO | Attending: Emergency Medicine | Admitting: Emergency Medicine

## 2023-09-13 DIAGNOSIS — R519 Headache, unspecified: Secondary | ICD-10-CM | POA: Insufficient documentation

## 2023-09-13 DIAGNOSIS — R079 Chest pain, unspecified: Secondary | ICD-10-CM

## 2023-09-13 DIAGNOSIS — Z20822 Contact with and (suspected) exposure to covid-19: Secondary | ICD-10-CM | POA: Diagnosis not present

## 2023-09-13 DIAGNOSIS — R0789 Other chest pain: Secondary | ICD-10-CM | POA: Diagnosis not present

## 2023-09-13 DIAGNOSIS — Z79899 Other long term (current) drug therapy: Secondary | ICD-10-CM | POA: Diagnosis not present

## 2023-09-13 DIAGNOSIS — I10 Essential (primary) hypertension: Secondary | ICD-10-CM | POA: Insufficient documentation

## 2023-09-13 DIAGNOSIS — R110 Nausea: Secondary | ICD-10-CM | POA: Diagnosis not present

## 2023-09-13 DIAGNOSIS — I2510 Atherosclerotic heart disease of native coronary artery without angina pectoris: Secondary | ICD-10-CM | POA: Insufficient documentation

## 2023-09-13 DIAGNOSIS — Z8546 Personal history of malignant neoplasm of prostate: Secondary | ICD-10-CM | POA: Insufficient documentation

## 2023-09-13 DIAGNOSIS — R0602 Shortness of breath: Secondary | ICD-10-CM | POA: Insufficient documentation

## 2023-09-13 DIAGNOSIS — R600 Localized edema: Secondary | ICD-10-CM | POA: Diagnosis not present

## 2023-09-13 DIAGNOSIS — R0981 Nasal congestion: Secondary | ICD-10-CM | POA: Diagnosis not present

## 2023-09-13 LAB — CBC WITH DIFFERENTIAL/PLATELET
Abs Immature Granulocytes: 0.06 10*3/uL (ref 0.00–0.07)
Basophils Absolute: 0.1 10*3/uL (ref 0.0–0.1)
Basophils Relative: 1 %
Eosinophils Absolute: 0.3 10*3/uL (ref 0.0–0.5)
Eosinophils Relative: 2 %
HCT: 43.4 % (ref 39.0–52.0)
Hemoglobin: 14.1 g/dL (ref 13.0–17.0)
Immature Granulocytes: 1 %
Lymphocytes Relative: 10 %
Lymphs Abs: 1.3 10*3/uL (ref 0.7–4.0)
MCH: 29 pg (ref 26.0–34.0)
MCHC: 32.5 g/dL (ref 30.0–36.0)
MCV: 89.1 fL (ref 80.0–100.0)
Monocytes Absolute: 0.7 10*3/uL (ref 0.1–1.0)
Monocytes Relative: 6 %
Neutro Abs: 9.7 10*3/uL — ABNORMAL HIGH (ref 1.7–7.7)
Neutrophils Relative %: 80 %
Platelets: 214 10*3/uL (ref 150–400)
RBC: 4.87 MIL/uL (ref 4.22–5.81)
RDW: 13.5 % (ref 11.5–15.5)
WBC: 12 10*3/uL — ABNORMAL HIGH (ref 4.0–10.5)
nRBC: 0 % (ref 0.0–0.2)

## 2023-09-13 LAB — COMPREHENSIVE METABOLIC PANEL
ALT: 17 U/L (ref 0–44)
AST: 13 U/L — ABNORMAL LOW (ref 15–41)
Albumin: 3.3 g/dL — ABNORMAL LOW (ref 3.5–5.0)
Alkaline Phosphatase: 54 U/L (ref 38–126)
Anion gap: 7 (ref 5–15)
BUN: 21 mg/dL (ref 8–23)
CO2: 24 mmol/L (ref 22–32)
Calcium: 9.4 mg/dL (ref 8.9–10.3)
Chloride: 108 mmol/L (ref 98–111)
Creatinine, Ser: 1.38 mg/dL — ABNORMAL HIGH (ref 0.61–1.24)
GFR, Estimated: 52 mL/min — ABNORMAL LOW (ref >60)
Glucose, Bld: 98 mg/dL (ref 70–99)
Potassium: 4 mmol/L (ref 3.5–5.1)
Sodium: 139 mmol/L (ref 135–145)
Total Bilirubin: 0.4 mg/dL (ref <1.2)
Total Protein: 6.3 g/dL — ABNORMAL LOW (ref 6.5–8.1)

## 2023-09-13 LAB — URINALYSIS, ROUTINE W REFLEX MICROSCOPIC
Bacteria, UA: NONE SEEN
Bilirubin Urine: NEGATIVE
Glucose, UA: NEGATIVE mg/dL
Hgb urine dipstick: NEGATIVE
Ketones, ur: NEGATIVE mg/dL
Leukocytes,Ua: NEGATIVE
Nitrite: NEGATIVE
Protein, ur: 30 mg/dL — AB
Specific Gravity, Urine: 1.015 (ref 1.005–1.030)
pH: 6 (ref 5.0–8.0)

## 2023-09-13 LAB — TROPONIN I (HIGH SENSITIVITY)
Troponin I (High Sensitivity): 5 ng/L (ref <18)
Troponin I (High Sensitivity): 6 ng/L (ref <18)

## 2023-09-13 LAB — D-DIMER, QUANTITATIVE: D-Dimer, Quant: 0.27 ug{FEU}/mL (ref 0.00–0.50)

## 2023-09-13 LAB — RESP PANEL BY RT-PCR (RSV, FLU A&B, COVID)  RVPGX2
Influenza A by PCR: NEGATIVE
Influenza B by PCR: NEGATIVE
Resp Syncytial Virus by PCR: NEGATIVE
SARS Coronavirus 2 by RT PCR: NEGATIVE

## 2023-09-13 LAB — BRAIN NATRIURETIC PEPTIDE: B Natriuretic Peptide: 33.9 pg/mL (ref 0.0–100.0)

## 2023-09-13 MED ORDER — KETOROLAC TROMETHAMINE 30 MG/ML IJ SOLN
15.0000 mg | Freq: Once | INTRAMUSCULAR | Status: AC
  Administered 2023-09-13: 15 mg via INTRAVENOUS
  Filled 2023-09-13: qty 1

## 2023-09-13 MED ORDER — SODIUM CHLORIDE 0.9 % IV BOLUS
1000.0000 mL | Freq: Once | INTRAVENOUS | Status: AC
  Administered 2023-09-13: 1000 mL via INTRAVENOUS

## 2023-09-13 NOTE — Discharge Instructions (Signed)
You were seen in the Emergency Department for chest pain Your chest x-ray, EKG, blood work all looked okay Your symptoms improved after some IV fluids and a medication called Toradol which is similar to ibuprofen here It is important that you follow-up with your primary care doctor regarding today's visit and your repeated episodes of high blood pressure at home Return to the emerged department for chest pain trouble breathing or any other concerns

## 2023-09-13 NOTE — ED Provider Notes (Signed)
[place] EMERGENCY DEPARTMENT AT [facility] Provider Note   CSN: [id number] Arrival date & time: 09/13/23  7253     History  Chief Complaint  Patient presents with   Chest Pain    Theodore Lamb is a 77 y.o. male.  With a history of CAD, hypertension and prostate cancer who presents to the ED for chest pain.  3 days ago he first experienced symptoms including rhinorrhea, sinus congestion, headache, chest pain and shortness of breath.  At that time he took his blood pressure and noted to be significantly elevated with systolic in the 200s at home.  Repeated measurement yesterday was again very high and he became concerned for hypertensive crisis after searching for his symptoms online.  Chest discomfort feels like a tightness and headache is pounding.  He has not had any changes in speech, weakness on one side of his body or dizziness.  He does note some increased peripheral edema at his ankles.  Some mild nausea but no vomiting diarrhea abdominal pain or other GI symptoms.  Difficulty initiating urination at time secondary to prostate cancer but no dysuria or hematuria.  No medications taken for symptoms at home.   Chest Pain      Home Medications Prior to Admission medications   Medication Sig Start Date End Date Taking? Authorizing Provider  amLODipine (NORVASC) 2.5 MG tablet Take 1 tablet (2.5 mg total) by mouth daily. 05/13/23  Yes Nahser, Deloris Ping, MD  atorvastatin (LIPITOR) 20 MG tablet Take 1 tablet (20 mg total) by mouth daily. 06/13/23 09/13/23 Yes Gaston Islam., NP  ezetimibe (ZETIA) 10 MG tablet Take 1 tablet (10 mg total) by mouth daily. 06/13/23  Yes Gaston Islam., NP  losartan (COZAAR) 100 MG tablet Take 1 tablet (100 mg total) by mouth daily. 05/27/23  Yes Nahser, Deloris Ping, MD      Allergies    Bupropion, Candesartan cilexetil, and Leuprolide    Review of Systems   Review of Systems  Cardiovascular:  Positive for chest pain.    Physical  Exam Updated Vital Signs BP 133/69   Pulse (!) 50   Temp 98.1 F (36.7 C)   Resp (!) 8   Ht 5\' 8"  (1.727 m)   Wt 108.9 kg   SpO2 97%   BMI 36.49 kg/m  Physical Exam Vitals and nursing note reviewed.  HENT:     Head: Normocephalic and atraumatic.  Eyes:     Pupils: Pupils are equal, round, and reactive to light.  Cardiovascular:     Rate and Rhythm: Normal rate and regular rhythm.  Pulmonary:     Effort: Pulmonary effort is normal.     Breath sounds: Normal breath sounds.  Abdominal:     Palpations: Abdomen is soft.     Tenderness: There is no abdominal tenderness.  Musculoskeletal:     Comments: Trace edema bilateral ankles  Skin:    General: Skin is warm and dry.  Neurological:     Mental Status: He is alert.  Psychiatric:        Mood and Affect: Mood normal.     ED Results / Procedures / Treatments   Labs (all labs ordered are listed, but only abnormal results are displayed) Labs Reviewed  COMPREHENSIVE METABOLIC PANEL - Abnormal; Notable for the following components:      Result Value   Creatinine, Ser 1.38 (*)    Total Protein 6.3 (*)    Albumin 3.3 (*)  AST 13 (*)    GFR, Estimated 52 (*)    All other components within normal limits  CBC WITH DIFFERENTIAL/PLATELET - Abnormal; Notable for the following components:   WBC 12.0 (*)    Neutro Abs 9.7 (*)    All other components within normal limits  URINALYSIS, ROUTINE W REFLEX MICROSCOPIC - Abnormal; Notable for the following components:   Protein, ur 30 (*)    All other components within normal limits  RESP PANEL BY RT-PCR (RSV, FLU A&B, COVID)  RVPGX2  D-DIMER, QUANTITATIVE  BRAIN NATRIURETIC PEPTIDE  TROPONIN I (HIGH SENSITIVITY)  TROPONIN I (HIGH SENSITIVITY)    EKG EKG Interpretation Date/Time:  Friday September 13 2023 08:22:25 EST Ventricular Rate:  50 PR Interval:  208 QRS Duration:  115 QT Interval:  433 QTC Calculation: 395 R Axis:   47  Text Interpretation: Sinus rhythm Incomplete  right bundle branch block Confirmed by Estelle June [phone]) on 09/13/2023 9:04:55 AM  Radiology DG Chest 2 View Result Date: 09/13/2023 CLINICAL DATA:  Pneumonia.  Hypertension. EXAM: CHEST - 2 VIEW COMPARISON:  None Available. FINDINGS: Normal mediastinum and cardiac silhouette. Lungs are hyperinflated. Normal pulmonary vasculature. No evidence of effusion, infiltrate, or pneumothorax. No acute bony abnormality. Degenerative osteophytosis of the spine. IMPRESSION: 1. No acute cardiopulmonary process. 2. Hyperinflation. Electronically Signed   By: Genevive Bi M.D.   On: 09/13/2023 09:18    Procedures Procedures    Medications Ordered in ED Medications  ketorolac (TORADOL) 30 MG/ML injection 15 mg (15 mg Intravenous Given 09/13/23 1128)  sodium chloride 0.9 % bolus 1,000 mL (1,000 mLs Intravenous New Bag/Given 09/13/23 1129)    ED Course/ Medical Decision Making/ A&P Clinical Course as of 09/13/23 1340  Fri Sep 13, 2023  1254 No acute consolidation on chest x-ray.  Laboratory workup unremarkable overall.  No evidence of urinary tract infection.  High sensitive troponin of 5 initially will obtain delta troponin.  BNP not elevated.  Patient reports improvement in his symptoms after fluids and Toradol here.  Awaiting repeat troponin and disposition [MP]  1339 Delta troponin flat.  Effectively ruling out ACS.  Patient has remained stable.  He will follow-up with his PCP [MP]    Clinical Course User Index [MP] Royanne Foots, DO                                 Medical Decision Making 77 year old male with history as above here for chest pain shortness of breath as well as headaches and sinus congestion for the last 3 to 4 days.  Hypertensive at home with repeated measurements and systolic blood pressure greater than 200.  Chest pain is a tightness quality.  Physical exam notable for trace peripheral edema with no adventitious lung sounds.  Afebrile and hypertensive here.  Comfortable  appearing upon my assessment.  Differential diagnosis includes ACS, dysrhythmia, new onset heart failure, pneumonia, viral respiratory infection, hypertensive crisis and pulmonary embolism.  Will obtain laboratory workup including high-sensitivity troponin, BNP, D-dimer along with an EKG and chest x-ray.  Will continue to monitor on telemetry  Amount and/or Complexity of Data Reviewed Labs: ordered. Radiology: ordered.           Final Clinical Impression(s) / ED Diagnoses Final diagnoses:  Chest pain, unspecified type  Nonintractable headache, unspecified chronicity pattern, unspecified headache type    Rx / DC Orders ED Discharge Orders     None  Royanne Foots, DO 09/13/23 1340

## 2023-09-13 NOTE — ED Triage Notes (Signed)
Pt BIB [facility] from home, patient had been having increasing chest pain, headache, shob since Tuesday. Per EMS patient walked about 59ft and noticed shob with ambulation. Patient states he feels pressure to his left chest no radiation noted per patient. Patient is nauseas but no vomiting or diaphoresis. Pain 0/10

## 2023-09-26 DIAGNOSIS — N529 Male erectile dysfunction, unspecified: Secondary | ICD-10-CM | POA: Diagnosis not present

## 2023-09-26 DIAGNOSIS — C61 Malignant neoplasm of prostate: Secondary | ICD-10-CM | POA: Diagnosis not present

## 2023-09-26 DIAGNOSIS — R310 Gross hematuria: Secondary | ICD-10-CM | POA: Diagnosis not present

## 2023-09-26 DIAGNOSIS — N35014 Post-traumatic urethral stricture, male, unspecified: Secondary | ICD-10-CM | POA: Diagnosis not present

## 2023-10-15 ENCOUNTER — Encounter (HOSPITAL_COMMUNITY): Payer: Self-pay | Admitting: Emergency Medicine

## 2023-10-15 ENCOUNTER — Other Ambulatory Visit: Payer: Self-pay

## 2023-10-15 ENCOUNTER — Emergency Department (HOSPITAL_COMMUNITY)
Admission: EM | Admit: 2023-10-15 | Discharge: 2023-10-15 | Disposition: A | Discharge status: Home / Self Care | Payer: Medicare HMO | Attending: Emergency Medicine | Admitting: Emergency Medicine

## 2023-10-15 ENCOUNTER — Emergency Department (HOSPITAL_COMMUNITY): Payer: Medicare HMO

## 2023-10-15 ENCOUNTER — Emergency Department (HOSPITAL_COMMUNITY)
Admission: EM | Admit: 2023-10-15 | Discharge: 2023-10-15 | Disposition: A | Discharge status: Against Medical Advice | Payer: Medicare HMO | Attending: Emergency Medicine | Admitting: Emergency Medicine

## 2023-10-15 ENCOUNTER — Telehealth: Payer: Self-pay | Admitting: Cardiovascular Disease

## 2023-10-15 ENCOUNTER — Encounter (HOSPITAL_COMMUNITY): Payer: Self-pay | Admitting: Pharmacy Technician

## 2023-10-15 DIAGNOSIS — Z79899 Other long term (current) drug therapy: Secondary | ICD-10-CM | POA: Insufficient documentation

## 2023-10-15 DIAGNOSIS — I2510 Atherosclerotic heart disease of native coronary artery without angina pectoris: Secondary | ICD-10-CM | POA: Insufficient documentation

## 2023-10-15 DIAGNOSIS — R0602 Shortness of breath: Secondary | ICD-10-CM | POA: Insufficient documentation

## 2023-10-15 DIAGNOSIS — R110 Nausea: Secondary | ICD-10-CM | POA: Diagnosis not present

## 2023-10-15 DIAGNOSIS — R0789 Other chest pain: Secondary | ICD-10-CM | POA: Insufficient documentation

## 2023-10-15 DIAGNOSIS — Z8546 Personal history of malignant neoplasm of prostate: Secondary | ICD-10-CM | POA: Insufficient documentation

## 2023-10-15 DIAGNOSIS — Z5321 Procedure and treatment not carried out due to patient leaving prior to being seen by health care provider: Secondary | ICD-10-CM | POA: Diagnosis not present

## 2023-10-15 DIAGNOSIS — I10 Essential (primary) hypertension: Secondary | ICD-10-CM | POA: Insufficient documentation

## 2023-10-15 DIAGNOSIS — R42 Dizziness and giddiness: Secondary | ICD-10-CM | POA: Diagnosis present

## 2023-10-15 DIAGNOSIS — I700 Atherosclerosis of aorta: Secondary | ICD-10-CM | POA: Diagnosis not present

## 2023-10-15 LAB — CBC
HCT: 45.4 % (ref 39.0–52.0)
Hemoglobin: 14.9 g/dL (ref 13.0–17.0)
MCH: 28.8 pg (ref 26.0–34.0)
MCHC: 32.8 g/dL (ref 30.0–36.0)
MCV: 87.8 fL (ref 80.0–100.0)
Platelets: 231 10*3/uL (ref 150–400)
RBC: 5.17 MIL/uL (ref 4.22–5.81)
RDW: 13.1 % (ref 11.5–15.5)
WBC: 7.7 10*3/uL (ref 4.0–10.5)
nRBC: 0 % (ref 0.0–0.2)

## 2023-10-15 LAB — COMPREHENSIVE METABOLIC PANEL
ALT: 25 U/L (ref 0–44)
AST: 21 U/L (ref 15–41)
Albumin: 3.6 g/dL (ref 3.5–5.0)
Alkaline Phosphatase: 61 U/L (ref 38–126)
Anion gap: 11 (ref 5–15)
BUN: 20 mg/dL (ref 8–23)
CO2: 23 mmol/L (ref 22–32)
Calcium: 9.5 mg/dL (ref 8.9–10.3)
Chloride: 106 mmol/L (ref 98–111)
Creatinine, Ser: 1.24 mg/dL (ref 0.61–1.24)
GFR, Estimated: 60 mL/min — ABNORMAL LOW (ref >60)
Glucose, Bld: 112 mg/dL — ABNORMAL HIGH (ref 70–99)
Potassium: 4.5 mmol/L (ref 3.5–5.1)
Sodium: 140 mmol/L (ref 135–145)
Total Bilirubin: 0.8 mg/dL (ref 0.0–1.2)
Total Protein: 6.9 g/dL (ref 6.5–8.1)

## 2023-10-15 LAB — URINALYSIS, ROUTINE W REFLEX MICROSCOPIC
Bilirubin Urine: NEGATIVE
Glucose, UA: NEGATIVE mg/dL
Hgb urine dipstick: NEGATIVE
Ketones, ur: NEGATIVE mg/dL
Leukocytes,Ua: NEGATIVE
Nitrite: NEGATIVE
Protein, ur: NEGATIVE mg/dL
Specific Gravity, Urine: 1.009 (ref 1.005–1.030)
pH: 7 (ref 5.0–8.0)

## 2023-10-15 LAB — TROPONIN I (HIGH SENSITIVITY)
Troponin I (High Sensitivity): 5 ng/L (ref <18)
Troponin I (High Sensitivity): 5 ng/L (ref <18)

## 2023-10-15 LAB — LIPASE, BLOOD: Lipase: 41 U/L (ref 11–51)

## 2023-10-15 NOTE — Telephone Encounter (Signed)
 Patient called and his BP is still elevated and he is still symptomatic. He states he went to ED but left because he waited 6 hours. He states he will go back.. Did make soonest appointment with APP.

## 2023-10-15 NOTE — ED Notes (Signed)
Patient called for vitals recheck. No response.  

## 2023-10-15 NOTE — Telephone Encounter (Signed)
 Spoke with patient and he states he woke up and he felt the spike in his BP. He is dizzy, nauseated and lightheaded. BP 186/97. Advised to go to ED being that he is symptomatic.

## 2023-10-15 NOTE — ED Triage Notes (Signed)
 Patient reports hypertension ongoing for several months. Patient states he has intermittent spells where his bp spikes to approx 200/100 associated with dizziness, shob, chest pressure, and nausea. Checked in earlier today but had to leave before being seen.

## 2023-10-15 NOTE — Telephone Encounter (Signed)
 Pt c/o BP issue: STAT if pt c/o blurred vision, one-sided weakness or slurred speech  1. What are your last 5 BP readings? 186/97; 62  2. Are you having any other symptoms (ex. Dizziness, headache, blurred vision, passed out)? Nauseous and lightheaded  3. What is your BP issue? Elevated hypertension

## 2023-10-15 NOTE — ED Provider Notes (Signed)
 [facility] Provider Note   CSN: [id number] Arrival date & time: 10/15/23  1720     History  Chief Complaint  Patient presents with   Hypertension    Theodore Lamb is a 78 y.o. male with past medical history of prostate cancer, HLD, OSA, CAD, HTN presents to emergency department for evaluation of intermittent hypertension.  He reports that his blood pressure can spike to 200/100 with associated dizziness, shortness of breath, chest pressure, and nausea.  He contacted his cardiologist who recommended him to be evaluated in the emergency department.  He has been taking his blood pressure medications as prescribed.  He checked in earlier today but had to leave before being seen.  Currently he has no complaints of chest pain, shortness of breath, visual disturbances, urinary symptoms.   Hypertension Pertinent negatives include no chest pain, no abdominal pain, no headaches and no shortness of breath.     Home Medications Prior to Admission medications   Medication Sig Start Date End Date Taking? Authorizing Provider  amLODipine  (NORVASC ) 2.5 MG tablet Take 1 tablet (2.5 mg total) by mouth daily. 05/13/23   Nahser, Aleene PARAS, MD  atorvastatin  (LIPITOR) 20 MG tablet Take 1 tablet (20 mg total) by mouth daily. 06/13/23 09/13/23  Wyn Jackee VEAR Mickey., NP  ezetimibe  (ZETIA ) 10 MG tablet Take 1 tablet (10 mg total) by mouth daily. 06/13/23   Wyn Jackee VEAR Mickey., NP  losartan  (COZAAR ) 100 MG tablet Take 1 tablet (100 mg total) by mouth daily. 05/27/23   Nahser, Aleene PARAS, MD      Allergies    Bupropion, Candesartan cilexetil, and Leuprolide     Review of Systems   Review of Systems  Constitutional:  Negative for chills, fatigue and fever.  Respiratory:  Negative for cough, chest tightness, shortness of breath and wheezing.   Cardiovascular:  Negative for chest pain and palpitations.  Gastrointestinal:  Negative for abdominal pain, constipation,  diarrhea, nausea and vomiting.  Neurological:  Negative for dizziness, seizures, weakness, light-headedness, numbness and headaches.    Physical Exam Updated Vital Signs BP (!) 146/87 (BP Location: Left Arm)   Pulse (!) 50   Temp 98.3 F (36.8 C)   Resp 18   Ht 5' 8 (1.727 m)   Wt 99.8 kg   SpO2 97%   BMI 33.45 kg/m  Physical Exam Vitals and nursing note reviewed.  Constitutional:      General: He is not in acute distress.    Appearance: Normal appearance.  HENT:     Head: Normocephalic and atraumatic.  Eyes:     General: No scleral icterus.       Right eye: No discharge.        Left eye: No discharge.     Extraocular Movements: Extraocular movements intact.     Conjunctiva/sclera: Conjunctivae normal.     Pupils: Pupils are equal, round, and reactive to light.  Cardiovascular:     Rate and Rhythm: Normal rate.     Pulses:          Radial pulses are 2+ on the right side and 2+ on the left side.       Dorsalis pedis pulses are 2+ on the right side and 2+ on the left side.  Pulmonary:     Effort: Pulmonary effort is normal. No respiratory distress.     Breath sounds: Normal breath sounds.  Chest:     Chest wall: No tenderness.  Abdominal:  General: Bowel sounds are normal. There is no distension.     Palpations: Abdomen is soft.     Tenderness: There is no abdominal tenderness.  Musculoskeletal:        General: No swelling.     Right lower leg: No edema.     Left lower leg: No edema.     Comments: No BLE tenderness nor overlying skin changes.  Skin:    General: Skin is warm.     Coloration: Skin is not jaundiced or pale.  Neurological:     Mental Status: He is alert and oriented to person, place, and time. Mental status is at baseline.     Cranial Nerves: No cranial nerve deficit.     Sensory: No sensory deficit.     Motor: No weakness.     Coordination: Coordination normal.     Gait: Gait normal.     Deep Tendon Reflexes: Reflexes normal.     Comments:  Acting appropriately.  Ambulates without difficulty     ED Results / Procedures / Treatments   Labs (all labs ordered are listed, but only abnormal results are displayed) Labs Reviewed - No data to display  EKG None  Radiology DG Chest 2 View Result Date: 10/15/2023 CLINICAL DATA:  Chest pressure, shortness of breath EXAM: CHEST - 2 VIEW COMPARISON:  09/13/2023 FINDINGS: Heart and mediastinal contours are within normal limits. No focal opacities or effusions. No acute bony abnormality. Aortic atherosclerosis. IMPRESSION: No active cardiopulmonary disease. Electronically Signed   By: Franky Crease M.D.   On: 10/15/2023 10:38    Procedures Procedures    Medications Ordered in ED Medications - No data to display  ED Course/ Medical Decision Making/ A&P                                 Medical Decision Making  Patient presents to the ED for concern of HTN, this involves an extensive number of treatment options, and is a complaint that carries with it a high risk of complications and morbidity.  The differential diagnosis includes ACS, hypertensive emergency, pneumonia, CHF exacerbation.  Not an exhaustive list   Co morbidities that complicate the patient evaluation  See HPI   Additional history obtained:  Additional history obtained from Nursing and Outside Medical Records   External records from outside source obtained and reviewed including  Triage RN note Telephone encounter from cardiology recommending patient to be evaluated emergency department   Lab Tests:  I Ordered, and personally interpreted labs.  The pertinent results include:   Troponin negative x 2 CBC, CMP, UA within normal limits   Imaging Studies ordered:  I ordered imaging studies including chest x-ray I independently visualized and interpreted imaging which showed no acute cardiopulmonary process I agree with the radiologist interpretation   Cardiac Monitoring:  The patient was maintained on  a cardiac monitor.  I personally viewed and interpreted the cardiac monitored which showed an underlying rhythm of:  NSR with incomplete RBBB which is unchanged from prior EKGs   Problem List / ED Course:  Hypertension Blood pressures in emergency department has been at max 156/64 Recheck patient's blood pressure and room at 2120 and patient's blood pressure was 127/66  He denies chest pain, shortness of breath, visual disturbances, any other medical complaints ED workup is completely WNL Discussed the importance of taking blood pressure at the same time every single day for the next  2 weeks until he follows up with his cardiologist.  It appears that he was not taking his blood pressure routinely at the same time each day causing.  Blood pressure readings.  I also suspect that his blood pressure may be elevated in the morning prior to taking his morning medications as the blood pressure medications wear off throughout the day and overnight. Discussed return to emergency department precautions with patient expresses understanding agrees with plan.  All questions answered to his satisfaction.  He is very agreeable to discharge.   Reevaluation:  After the interventions noted above, I reevaluated the patient and found that they have :improved   Social Determinants of Health:  Has cardiologist and PCP follow-up   Dispostion:  After consideration of the diagnostic results and the patients response to treatment, I feel that the patent would benefit from outpatient management and cardiologist follow-up.   Final Clinical Impression(s) / ED Diagnoses Final diagnoses:  Hypertension, unspecified type    Rx / DC Orders ED Discharge Orders     None         Minnie Tinnie BRAVO, PA 10/15/23 2145    Lenor Hollering, MD 10/15/23 2327

## 2023-10-15 NOTE — Discharge Instructions (Addendum)
 Thank you for letting us  evaluate you today.  Your blood pressures here in the emergency department have been under 150/64.  The most recent blood pressure I took was 122/66 which is excellent.  I recommend that you take your blood pressure 1 time in the morning and 1 time at the evening at the same time every day for the next 2 weeks for your cardiologist to review.  I suspect that your blood pressure is likely high in the morning due to not taking her medications yet.  Please take your medications as prescribed.  Your ED workup here in the emergency department was completely normal.  Return to emergency department if you experience visual disturbances, chest pain, shortness of breath.

## 2023-10-15 NOTE — ED Triage Notes (Signed)
 Pt here with repots of hypertension yesterday and today. Endorses feeling "off". +shob, chest pressure onset today along with some nausea.

## 2023-10-15 NOTE — Telephone Encounter (Signed)
  Pt c/o BP issue: STAT if pt c/o blurred vision, one-sided weakness or slurred speech  1. What are your last 5 BP readings? 182/92 HR 60; 212/97 HR 64; 153/84 HR 53; 184/100  2. Are you having any other symptoms (ex. Dizziness, headache, blurred vision, passed out)? Dizziness, sob, some pressure  3. What is your BP issue?   Patient did go to the ER. Please advise

## 2023-10-23 DIAGNOSIS — Z961 Presence of intraocular lens: Secondary | ICD-10-CM | POA: Diagnosis not present

## 2023-10-23 DIAGNOSIS — H04123 Dry eye syndrome of bilateral lacrimal glands: Secondary | ICD-10-CM | POA: Diagnosis not present

## 2023-10-29 NOTE — Progress Notes (Unsigned)
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   Cervical spondylosis    Depression    FH: alcoholism    H/O leukocytosis    leukocytosis and erythrocytosis   Nuclear sclerotic cataract of both eyes 10/31/2020   Prostate cancer (HCC)    S/P radiation therapy 02/23/2015 through 04/21/2015     Prostate 7800 cGy in 40 sessions, seminal vesicles, and pelvic lymph nodes 5600 cGy in 40 sessions                         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   Theodore Lamb was last seen by Dr. Elease Hashimoto on 08/13/2023 following episodes of near syncope.  He also endorsed occasional palpitations and wore a 14-day Zio patch that showed one 5.1-second episode of a pause which might have been correlated with his carotid duplex. There were no other significant arrhythmias noted. He reported noncompliance with his CPAP and was scheduled to follow-up with Dr. Mayford Knife.  His blood pressures during visit were well-controlled and patient was not taking his atorvastatin.  LFTs and lipids were rechecked showing LDL of 71.  He was seen in the ED on 09/13/2023 with complaint of chest pain.  During his visit he reported chest tightness with headache that was pounding.  He underwent workup and ACS was ruled out due to negative troponins and BNP was normal.  He was discharged in stable condition.  He contacted our office on 10/15/2023 with complaint of elevated BP.  He  endorsed dizziness with nausea and lightheadedness.  He was advised to go to the ED and was seen with BP documented at 146/87 with a max of 156/64, 127/66.  ED workup was WNL and patient advised to follow-up with cardiology.  Theodore Lamb presents today for post ED follow-up for elevated blood pressure.  During today's visit his blood pressure was controlled at 138/60.  He reports episodes of elevated BP that occurs throughout the day.  The patient reports these episodes occur sporadically and have led to multiple visits to the emergency room. Despite these episodes, the patient's blood pressure readings in the emergency room have been within normal limits.  The patient also reports swelling in the ankles, suspected to be a side effect of amlodipine, a medication he is currently taking. The patient has a habit of vaping and consuming coffee, both of which could be contributing to the elevated blood pressure. The patient also reports feeling a fluttering sensation in the chest during these episodes, but a previous event monitor only showed one episode of a pause, which was attributed to pressure on the carotid artery during a carotid ultrasound. Patient denies chest pain, palpitations, dyspnea, PND, orthopnea, nausea, vomiting, dizziness, syncope, edema, weight gain, or early satiety.   Review of Systems  Please see the history of present illness.    All other systems reviewed and are otherwise  negative except as noted above.  Physical Exam    Wt Readings from Last 3 Encounters:  10/15/23 220 lb (99.8 kg)  09/13/23 240 lb (108.9 kg)  08/13/23 228 lb 9.6 oz (103.7 kg)   ZO:XWRUE were no vitals filed for this visit.,There is no height or weight on file to calculate BMI. GEN: Well nourished, well developed in no acute distress Neck: No JVD; No carotid bruits Pulmonary: Clear to auscultation without rales, wheezing or rhonchi  Cardiovascular: Normal rate. Regular rhythm. Normal S1. Normal S2.    Murmurs: There is no murmur.  ABDOMEN: Soft, non-tender, non-distended EXTREMITIES:  No edema; No deformity   EKG/LABS/ Recent Cardiac Studies   ECG personally reviewed by me today -none completed today  Risk Assessment/Calculations:      STOP-Bang Score:         Lab Results  Component Value Date   WBC 7.7 10/15/2023   HGB 14.9 10/15/2023   HCT 45.4 10/15/2023   MCV 87.8 10/15/2023   PLT 231 10/15/2023   Lab Results  Component Value Date   CREATININE 1.24 10/15/2023   BUN 20 10/15/2023   NA 140 10/15/2023   K 4.5 10/15/2023   CL 106 10/15/2023   CO2 23 10/15/2023   Lab Results  Component Value Date   CHOL 133 08/13/2023   HDL 44 08/13/2023   LDLCALC 71 08/13/2023   TRIG 95 08/13/2023   CHOLHDL 3.0 08/13/2023    No results found for: "HGBA1C" Assessment & Plan    1.  Essential hypertension: -Patient's blood pressure today was stable at 138/60 He reports poorly controlled BPs at home. -He will split Losartan dose to 50mg  twice daily for better coverage. -Discontinue Amlodipine due to peripheral edema. -Start Hydrochlorothiazide 12.5mg  daily to address peripheral edema and assist with blood pressure control. -BMET and 2 weeks -Advise patient to reduce caffeine intake and vaping, and to monitor salt intake. -Recheck blood pressure readings in 2 weeks.  2.Nonobstructive CAD: -s/p cardiac CTA that showed calcium score of 438 with moderate CAD noted and no significant stenosis by FFR -Today patient reports no chest pain or angina. -Continue current GDMT with ezetimibe 10 mg and Lipitor 20 mg  3.Hyperlipidemia: -Patient's last LDL cholesterol was stable at 71 -Continue ezetimibe 10 mg and Lipitor 20 mg  4.History of OSA: -Patient reports compliance with CPAP and is currently followed by Dr. Mayford Knife.  5. Palpitations Previous event monitor showed one episode of pause, likely related to carotid sinus sensitivity during carotid ultrasound. No significant  arrhythmias noted. Recent palpitations may be related to caffeine intake and anxiety. -Advise patient to reduce caffeine intake and manage stress/anxiety. -Continue monitoring symptoms.  Disposition: Follow-up with Kristeen Miss, MD or APP in 10 months    Signed, Napoleon Form, Leodis Rains, NP 10/29/2023, 12:50 PM [facility]

## 2023-10-30 ENCOUNTER — Ambulatory Visit: Payer: Medicare HMO | Attending: Nurse Practitioner | Admitting: Nurse Practitioner

## 2023-10-30 ENCOUNTER — Encounter: Payer: Self-pay | Admitting: Nurse Practitioner

## 2023-10-30 VITALS — BP 138/60 | HR 66 | Ht 68.0 in | Wt 237.4 lb

## 2023-10-30 DIAGNOSIS — E782 Mixed hyperlipidemia: Secondary | ICD-10-CM | POA: Diagnosis not present

## 2023-10-30 DIAGNOSIS — G4733 Obstructive sleep apnea (adult) (pediatric): Secondary | ICD-10-CM | POA: Diagnosis not present

## 2023-10-30 DIAGNOSIS — I10 Essential (primary) hypertension: Secondary | ICD-10-CM

## 2023-10-30 DIAGNOSIS — R55 Syncope and collapse: Secondary | ICD-10-CM

## 2023-10-30 DIAGNOSIS — I2510 Atherosclerotic heart disease of native coronary artery without angina pectoris: Secondary | ICD-10-CM | POA: Diagnosis not present

## 2023-10-30 MED ORDER — HYDROCHLOROTHIAZIDE 12.5 MG PO CAPS: 12.5000 mg | ORAL_CAPSULE | Freq: Every day | ORAL | 3 refills | Status: AC

## 2023-10-30 MED ORDER — EZETIMIBE 10 MG PO TABS: 10.0000 mg | ORAL_TABLET | Freq: Every day | ORAL | 3 refills | Status: AC

## 2023-10-30 MED ORDER — ATORVASTATIN CALCIUM 20 MG PO TABS: 20.0000 mg | ORAL_TABLET | Freq: Every day | ORAL | 3 refills | Status: AC

## 2023-10-30 MED ORDER — LOSARTAN POTASSIUM 100 MG PO TABS: 50.0000 mg | ORAL_TABLET | Freq: Two times a day (BID) | ORAL | 3 refills | Status: AC

## 2023-10-30 NOTE — Patient Instructions (Signed)
Medication Instructions:  Stop Amlodipine  Losartan 50 mg twice daily  Hydrochlorothiazide 12.5mg  *If you need a refill on your cardiac medications before your next appointment, please call your pharmacy*   Lab Work: Bmet in 2 weeks  If you have labs (blood work) drawn today and your tests are completely normal, you will receive your results only by: MyChart Message (if you have MyChart) OR A paper copy in the mail If you have any lab test that is abnormal or we need to change your treatment, we will call you to review the results.   Testing/Procedures: None    Follow-Up: At [facility], you and your health needs are our priority.  As part of our continuing mission to provide you with exceptional heart care, we have created designated Provider Care Teams.  These Care Teams include your primary Cardiologist (physician) and Advanced Practice Providers (APPs -  Physician Assistants and Nurse Practitioners) who all work together to provide you with the care you need, when you need it.    Your next appointment:   10 month(s)  Provider:   Kristeen Miss, MD     Other Instructions Log BP for 2 weeks Reduce vape and caffeine     Heart-Healthy Eating Plan Eating a healthy diet is important for the health of your heart. A heart-healthy eating plan includes: Eating less unhealthy fats. Eating more healthy fats. Eating less salt in your food. Salt is also called sodium. Making other changes in your diet. Talk with your doctor or a diet specialist (dietitian) to create an eating plan that is right for you. What is my plan? Your doctor may recommend an eating plan that includes: Total fat: ______% or less of total calories a day. Saturated fat: ______% or less of total calories a day. Cholesterol: less than _________mg a day. Sodium: less than _________mg a day. What are tips for following this plan? Cooking Avoid frying your food. Try to bake, boil, grill, or broil it  instead. You can also reduce fat by: Removing the skin from poultry. Removing all visible fats from meats. Steaming vegetables in water or broth. Meal planning  At meals, divide your plate into four equal parts: Fill one-half of your plate with vegetables and green salads. Fill one-fourth of your plate with whole grains. Fill one-fourth of your plate with lean protein foods. Eat 2-4 cups of vegetables per day. One cup of vegetables is: 1 cup (91 g) broccoli or cauliflower florets. 2 medium carrots. 1 large bell pepper. 1 large sweet potato. 1 large tomato. 1 medium white potato. 2 cups (150 g) raw leafy greens. Eat 1-2 cups of fruit per day. One cup of fruit is: 1 small apple 1 large banana 1 cup (237 g) mixed fruit, 1 large orange,  cup (82 g) dried fruit, 1 cup (240 mL) 100% fruit juice. Eat more foods that have soluble fiber. These are apples, broccoli, carrots, beans, peas, and barley. Try to get 20-30 g of fiber per day. Eat 4-5 servings of nuts, legumes, and seeds per week: 1 serving of dried beans or legumes equals  cup (90 g) cooked. 1 serving of nuts is  oz (12 almonds, 24 pistachios, or 7 walnut halves). 1 serving of seeds equals  oz (8 g). General information Eat more home-cooked food. Eat less restaurant, buffet, and fast food. Limit or avoid alcohol. Limit foods that are high in starch and sugar. Avoid fried foods. Lose weight if you are overweight. Keep track of  how much salt (sodium) you eat. This is important if you have high blood pressure. Ask your doctor to tell you more about this. Try to add vegetarian meals each week. Fats Choose healthy fats. These include olive oil and canola oil, flaxseeds, walnuts, almonds, and seeds. Eat more omega-3 fats. These include salmon, mackerel, sardines, tuna, flaxseed oil, and ground flaxseeds. Try to eat fish at least 2 times each week. Check food labels. Avoid foods with trans fats or high amounts of saturated  fat. Limit saturated fats. These are often found in animal products, such as meats, butter, and cream. These are also found in plant foods, such as palm oil, palm kernel oil, and coconut oil. Avoid foods with partially hydrogenated oils in them. These have trans fats. Examples are stick margarine, some tub margarines, cookies, crackers, and other baked goods. What foods should I eat? Fruits All fresh, canned (in natural juice), or frozen fruits. Vegetables Fresh or frozen vegetables (raw, steamed, roasted, or grilled). Green salads. Grains Most grains. Choose whole wheat and whole grains most of the time. Rice and pasta, including brown rice and pastas made with whole wheat. Meats and other proteins Lean, well-trimmed beef, veal, pork, and lamb. Chicken and [place] without skin. All fish and shellfish. Wild duck, rabbit, pheasant, and venison. Egg whites or low-cholesterol egg substitutes. Dried beans, peas, lentils, and tofu. Seeds and most nuts. Dairy Low-fat or nonfat cheeses, including ricotta and mozzarella. Skim or 1% milk that is liquid, powdered, or evaporated. Buttermilk that is made with low-fat milk. Nonfat or low-fat yogurt. Fats and oils Non-hydrogenated (trans-free) margarines. Vegetable oils, including soybean, sesame, sunflower, olive, peanut, safflower, corn, canola, and cottonseed. Salad dressings or mayonnaise made with a vegetable oil. Beverages Mineral water. Coffee and tea. Diet carbonated beverages. Sweets and desserts Sherbet, gelatin, and fruit ice. Small amounts of dark chocolate. Limit all sweets and desserts. Seasonings and condiments All seasonings and condiments. The items listed above may not be a complete list of foods and drinks you can eat. Contact a dietitian for more options. What foods should I avoid? Fruits Canned fruit in heavy syrup. Fruit in cream or butter sauce. Fried fruit. Limit coconut. Vegetables Vegetables cooked in cheese, cream, or butter  sauce. Fried vegetables. Grains Breads that are made with saturated or trans fats, oils, or whole milk. Croissants. Sweet rolls. Donuts. High-fat crackers, such as cheese crackers. Meats and other proteins Fatty meats, such as hot dogs, ribs, sausage, bacon, rib-eye roast or steak. High-fat deli meats, such as salami and bologna. Caviar. Domestic duck and goose. Organ meats, such as liver. Dairy Cream, sour cream, cream cheese, and creamed cottage cheese. Whole-milk cheeses. Whole or 2% milk that is liquid, evaporated, or condensed. Whole buttermilk. Cream sauce or high-fat cheese sauce. Yogurt that is made from whole milk. Fats and oils Meat fat, or shortening. Cocoa butter, hydrogenated oils, palm oil, coconut oil, palm kernel oil. Solid fats and shortenings, including bacon fat, salt pork, lard, and butter. Nondairy cream substitutes. Salad dressings with cheese or sour cream. Beverages Regular sodas and juice drinks with added sugar. Sweets and desserts Frosting. Pudding. Cookies. Cakes. Pies. Milk chocolate or white chocolate. Buttered syrups. Full-fat ice cream or ice cream drinks. The items listed above may not be a complete list of foods and drinks to avoid. Contact a dietitian for more information. Summary Heart-healthy meal planning includes eating less unhealthy fats, eating more healthy fats, and making other changes in your diet. Eat a balanced diet.  This includes fruits and vegetables, low-fat or nonfat dairy, lean protein, nuts and legumes, whole grains, and heart-healthy oils and fats. This information is not intended to replace advice given to you by your health care provider. Make sure you discuss any questions you have with your health care provider. Document Revised: 10/23/2021 Document Reviewed: 10/23/2021 Elsevier Patient Education  2024 [employer].

## 2023-12-18 DIAGNOSIS — R0602 Shortness of breath: Secondary | ICD-10-CM | POA: Diagnosis not present

## 2023-12-18 DIAGNOSIS — I10 Essential (primary) hypertension: Secondary | ICD-10-CM | POA: Diagnosis not present

## 2023-12-18 DIAGNOSIS — R5383 Other fatigue: Secondary | ICD-10-CM | POA: Diagnosis not present

## 2023-12-18 DIAGNOSIS — N1831 Chronic kidney disease, stage 3a: Secondary | ICD-10-CM | POA: Diagnosis not present

## 2023-12-18 DIAGNOSIS — R7303 Prediabetes: Secondary | ICD-10-CM | POA: Diagnosis not present

## 2023-12-18 DIAGNOSIS — R110 Nausea: Secondary | ICD-10-CM | POA: Diagnosis not present

## 2023-12-18 DIAGNOSIS — Z03818 Encounter for observation for suspected exposure to other biological agents ruled out: Secondary | ICD-10-CM | POA: Diagnosis not present

## 2023-12-22 ENCOUNTER — Emergency Department (HOSPITAL_COMMUNITY)

## 2023-12-22 ENCOUNTER — Other Ambulatory Visit: Payer: Self-pay

## 2023-12-22 ENCOUNTER — Encounter (HOSPITAL_COMMUNITY): Payer: Self-pay

## 2023-12-22 ENCOUNTER — Emergency Department (HOSPITAL_COMMUNITY): Admission: EM | Admit: 2023-12-22 | Discharge: 2023-12-22 | Disposition: A | Discharge status: Home / Self Care

## 2023-12-22 DIAGNOSIS — R0989 Other specified symptoms and signs involving the circulatory and respiratory systems: Secondary | ICD-10-CM | POA: Diagnosis not present

## 2023-12-22 DIAGNOSIS — I10 Essential (primary) hypertension: Secondary | ICD-10-CM | POA: Diagnosis not present

## 2023-12-22 DIAGNOSIS — R0602 Shortness of breath: Secondary | ICD-10-CM | POA: Insufficient documentation

## 2023-12-22 DIAGNOSIS — R42 Dizziness and giddiness: Secondary | ICD-10-CM | POA: Diagnosis not present

## 2023-12-22 DIAGNOSIS — R0789 Other chest pain: Secondary | ICD-10-CM | POA: Diagnosis not present

## 2023-12-22 DIAGNOSIS — J9811 Atelectasis: Secondary | ICD-10-CM | POA: Diagnosis not present

## 2023-12-22 DIAGNOSIS — R519 Headache, unspecified: Secondary | ICD-10-CM | POA: Diagnosis not present

## 2023-12-22 DIAGNOSIS — Z79899 Other long term (current) drug therapy: Secondary | ICD-10-CM | POA: Insufficient documentation

## 2023-12-22 DIAGNOSIS — H538 Other visual disturbances: Secondary | ICD-10-CM | POA: Diagnosis not present

## 2023-12-22 LAB — CBC
HCT: 43.5 % (ref 39.0–52.0)
Hemoglobin: 14.6 g/dL (ref 13.0–17.0)
MCH: 29.6 pg (ref 26.0–34.0)
MCHC: 33.6 g/dL (ref 30.0–36.0)
MCV: 88.1 fL (ref 80.0–100.0)
Platelets: 216 10*3/uL (ref 150–400)
RBC: 4.94 MIL/uL (ref 4.22–5.81)
RDW: 13.1 % (ref 11.5–15.5)
WBC: 8 10*3/uL (ref 4.0–10.5)
nRBC: 0 % (ref 0.0–0.2)

## 2023-12-22 LAB — BRAIN NATRIURETIC PEPTIDE: B Natriuretic Peptide: 38 pg/mL (ref 0.0–100.0)

## 2023-12-22 LAB — BASIC METABOLIC PANEL
Anion gap: 10 (ref 5–15)
BUN: 20 mg/dL (ref 8–23)
CO2: 25 mmol/L (ref 22–32)
Calcium: 9.1 mg/dL (ref 8.9–10.3)
Chloride: 100 mmol/L (ref 98–111)
Creatinine, Ser: 1.39 mg/dL — ABNORMAL HIGH (ref 0.61–1.24)
GFR, Estimated: 52 mL/min — ABNORMAL LOW (ref >60)
Glucose, Bld: 101 mg/dL — ABNORMAL HIGH (ref 70–99)
Potassium: 3.9 mmol/L (ref 3.5–5.1)
Sodium: 135 mmol/L (ref 135–145)

## 2023-12-22 LAB — TROPONIN I (HIGH SENSITIVITY)
Troponin I (High Sensitivity): 7 ng/L (ref <18)
Troponin I (High Sensitivity): 16 ng/L (ref <18)

## 2023-12-22 MED ORDER — ONDANSETRON HCL 4 MG/2ML IJ SOLN
4.0000 mg | Freq: Once | INTRAMUSCULAR | Status: AC
  Administered 2023-12-22: 4 mg via INTRAVENOUS
  Filled 2023-12-22: qty 2

## 2023-12-22 MED ORDER — FUROSEMIDE 10 MG/ML IJ SOLN
40.0000 mg | Freq: Once | INTRAMUSCULAR | Status: AC
  Administered 2023-12-22: 40 mg via INTRAVENOUS
  Filled 2023-12-22: qty 4

## 2023-12-22 NOTE — ED Triage Notes (Signed)
 Pt BIB EMS due HA, blurry vision, and HTN. EMS states it went away when they got there. Pt usually takes HTN meds at night. LSN 1000. C/O SHOB. Denies CP.

## 2023-12-22 NOTE — ED Provider Notes (Signed)
 Theodore Lamb AT Theodore [facility] Provider Note   CSN: [id number] Arrival date & time: 12/22/23  1039     History  Chief Complaint  Patient presents with   Hypertension    Theodore Lamb is a 78 y.o. male.  78 year old male with past medical history of hypertension hyperlipidemia presenting to the emergency Lamb today with concern for chest pressure, shortness of breath, and mild headache.  Patient states this started roughly 30 minutes prior to arrival.  They states he also had some blurred vision that is since improved.  He states that he checked his blood pressure at home as it was elevated at 200/106.  He denies any focal weakness, numbness, or tingling.  He states he did have some intermittent blurred vision which has since resolved.  He states that his chest pressure has improved.  He is reporting some nausea as well.  States that this is persisting.  Came to the ER for further evaluation regarding this.   Hypertension Associated symptoms include headaches.       Home Medications Prior to Admission medications   Medication Sig Start Date End Date Taking? Authorizing Provider  amLODipine (NORVASC) 2.5 MG tablet Take 1 tablet (2.5 mg total) by mouth daily. 05/13/23   Nahser, Deloris Ping, MD  atorvastatin (LIPITOR) 20 MG tablet Take 1 tablet (20 mg total) by mouth daily. 10/30/23 11/29/23  Gaston Islam., NP  ezetimibe (ZETIA) 10 MG tablet Take 1 tablet (10 mg total) by mouth daily. 10/30/23   Gaston Islam., NP  hydrochlorothiazide (MICROZIDE) 12.5 MG capsule Take 1 capsule (12.5 mg total) by mouth daily. 10/30/23 01/28/24  Gaston Islam., NP  losartan (COZAAR) 100 MG tablet Take 0.5 tablets (50 mg total) by mouth 2 (two) times daily. 10/30/23   Gaston Islam., NP      Allergies    Bupropion, Candesartan cilexetil, and Leuprolide    Review of Systems   Review of Systems  Eyes:  Positive for visual disturbance.  Respiratory:  Positive  for chest tightness.   Neurological:  Positive for headaches.  All other systems reviewed and are negative.   Physical Exam Updated Vital Signs BP 116/67   Pulse (!) 57   Temp 98.3 F (36.8 C) (Oral)   Resp 19   Ht 5\' 8"  (1.727 m)   Wt 108.9 kg   SpO2 96%   BMI 36.49 kg/m  Physical Exam Vitals and nursing note reviewed.   Gen: NAD Eyes: PERRL, EOMI HEENT: no oropharyngeal swelling Neck: trachea midline Resp: clear to auscultation bilaterally Card: RRR, no murmurs, rubs, or gallops Abd: nontender, nondistended Extremities: no calf tenderness, no edema Vascular: 2+ radial pulses bilaterally, 2+ DP pulses bilaterally Neuro: Cranial nerves intact, equal strength and sensation throughout bilateral upper and lower extremities with no dysmetria on finger-to-nose testing Skin: no rashes Psyc: acting appropriately   ED Results / Procedures / Treatments   Labs (all labs ordered are listed, but only abnormal results are displayed) Labs Reviewed  BASIC METABOLIC PANEL - Abnormal; Notable for the following components:      Result Value   Glucose, Bld 101 (*)    Creatinine, Ser 1.39 (*)    GFR, Estimated 52 (*)    All other components within normal limits  CBC  BRAIN NATRIURETIC PEPTIDE  TROPONIN I (HIGH SENSITIVITY)  TROPONIN I (HIGH SENSITIVITY)    EKG EKG Interpretation Date/Time:  Sunday December 22 2023 10:45:16 EDT Ventricular  Rate:  60 PR Interval:  203 QRS Duration:  142 QT Interval:  453 QTC Calculation: 453 R Axis:   8  Text Interpretation: Sinus rhythm Atrial premature complex Right bundle branch block Confirmed by Beckey Downing [phone]) on 12/22/2023 10:54:36 AM  Radiology CT Head Wo Contrast Result Date: 12/22/2023 CLINICAL DATA:  78 year old male with headache. EXAM: CT HEAD WITHOUT CONTRAST TECHNIQUE: Contiguous axial images were obtained from the base of the skull through the vertex without intravenous contrast. RADIATION DOSE REDUCTION: This exam was  performed according to the departmental dose-optimization program which includes automated exposure control, adjustment of the mA and/or kV according to patient size and/or use of iterative reconstruction technique. COMPARISON:  Head CT 12/27/2022. FINDINGS: Brain: Cerebral volume is stable, normal for age. Stable extra-axial subarachnoid CSF which appears inconsequential. No midline shift, ventriculomegaly, mass effect, evidence of mass lesion, intracranial hemorrhage or evidence of cortically based acute infarction. Gray-white matter differentiation is within normal limits throughout the brain. Vascular: Calcified atherosclerosis at the skull base. No suspicious intracranial vascular hyperdensity. Skull: Stable, intact. Sinuses/Orbits: Visualized paranasal sinuses and mastoids are stable and well aerated. Other: Stable orbit and scalp soft tissues. IMPRESSION: Stable and negative for age noncontrast Head CT. Electronically Signed   By: Theodore Lamb M.D.   On: 12/22/2023 11:48   DG Chest Port 1 View Result Date: 12/22/2023 CLINICAL DATA:  Headache, blurred vision and hypertension. EXAM: PORTABLE CHEST 1 VIEW COMPARISON:  Two-view chest x-ray 10/15/2023 FINDINGS: The heart size is normal. Atherosclerotic calcifications are present at the aortic arch. Mild pulmonary vascular congestion is new. Minimal atelectasis is present at the bases. The lungs are otherwise clear. The visualized soft tissues and bony thorax are unremarkable. IMPRESSION: 1. Mild pulmonary vascular congestion. 2. Minimal bibasilar atelectasis. Electronically Signed   By: Marin Roberts M.D.   On: 12/22/2023 11:10    Procedures Procedures    Medications Ordered in ED Medications  ondansetron (ZOFRAN) injection 4 mg (4 mg Intravenous Given 12/22/23 1105)  furosemide (LASIX) injection 40 mg (40 mg Intravenous Given 12/22/23 1211)    ED Course/ Medical Decision Making/ A&P                                 Medical Decision  Making 78 year old male with past medical history of hypertension hyperlipidemia presents the emergency Lamb today with chest pressure as well as mild headache and some dyspnea.  I will further evaluate patient here with basic labs Wels and EKG, chest x-ray, and troponin for further evaluation for ACS, pulmonary edema, pulmonary infiltrates, pneumothorax.  Also obtain a CT scan of his head for further evaluation for intracranial hemorrhage or mass lesion as he reports not having headaches historically.  Based on description of his symptoms suspicion for subarachnoid hemorrhage is low but a CT scan should further evaluate for this since this will be within 6 hours.  Will give the patient Zofran for his nausea.  Will reevaluate for ultimate disposition.  The patient's work appears reassuring.  Blood pressure is stable.  CT scan does not show any concerning findings.  His initial x-ray did show some possible pulmonary vascular congestion.  The patient is given a dose of Lasix.  His BNP was within normal limits.  He had 2 troponins that were negative here.  I think that he is stable for discharge.  I will refer him back to cardiology for further evaluation as an outpatient  but I think that he may be safely discharged.  Amount and/or Complexity of Data Reviewed Labs: ordered. Radiology: ordered.  Risk Prescription drug management.           Final Clinical Impression(s) / ED Diagnoses Final diagnoses:  Chest pressure    Rx / DC Orders ED Discharge Orders          Ordered    Ambulatory referral to Cardiology       Comments: If you have not heard from the Cardiology office within the next 72 hours please call [phone].   12/22/23 1500              Durwin Glaze, MD 12/22/23 1501

## 2023-12-22 NOTE — Discharge Instructions (Signed)
 Your workup today was reassuring.  I have placed a referral to our cardiology team.  You should receive a call in the next few days for further instructions.  Please follow-up and return to the ER for worsening symptoms.

## 2023-12-30 ENCOUNTER — Encounter: Payer: Self-pay | Admitting: Cardiovascular Disease

## 2023-12-30 NOTE — Progress Notes (Addendum)
 Cardiology Office Note:    Date:  12/31/2023   ID:  Ezzard Standing, DOB [birth year], MRN [id number]  PCP:  Daisy Floro, MD  [facility] Cardiologist:  Lynora Dymond  [facility] Electrophysiologist:  None   Referring MD: Daisy Floro, MD   Chief Complaint  Patient presents with   Hyperlipidemia   Palpitations     Sept. 7, 2021   Theodore Lamb is a 78 y.o. male with a hx of  Palpitations and blurred vision. We were asked to see him today by Dr. Zachery Dauer for further evaluation of his palpitations.   Has been having unpredictable spells for the past 3-4 weeks.  Occurs spontaneously,   Not preceded by anything in particular  May be associated with eating  - several have occurred 30 min after eating .  Works around his house.   Does not exercise per se.   Does not walk much  Is limited by back pain .  The spells will last 15-30 min.  Has mild episodes that start with mild mid sternal pressure,  A flutter sensation, mild heart burn,  And the sensation that he needs to take more frequent breaths . Som bad episodes cause some vision changes, nausea , hands and feet numbness.  Assoc with tightness in his throat .   Checks his Omron BP cuff and his VS are normal .   Eats and drinks regularly .  Not associated with syncope or presyncope  The symptoms dissapate over 1-2 minutes and then he feels well .   Has been seen by Dr. Donnie Aho in the past  Has worn a monitor and did an echo  Was told his echo was fine .   December 09, 2020:   Theodore Lamb is seen today for follow up of his palpitations,  Moderate CAD , HLD   CCTA  in Oct. 2021 reveals : Coronary calcium score: The patient's coronary artery calcium score is 438, which places the patient in the 46 percentile. 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    December 11, 2021: Theodore Lamb is seen for follow up of his palpitations, moderate CAD, HLD  Eats bacon regularly ,  might explain his BP elevation  Does not eat fast food  Wt is 247 lbs.    Sept. 15, 2023 Theodore Lamb is seen for follow up of his palpitations, moderte CAD , HLD  Is vaping .   , not smoking    No CP ,  Palpitations  Is not exercising regularly  , tore his R knee meniscus Coronary CTA:  IMPRESSION: 1. Moderate CAD, CADRADS = 3. Given moderate disease, CT FFR will be performed and reported separately.   2. Coronary calcium score of 438. This was 17 percentile for age and sex matched control.  I suspect he has some deconditioning   Advised more exercise Visit with ortho or sports medicine dr to work on knee pain    Dec. 4, 2023  Theodore Lamb is seen for follow up of is HLD,  Echo  from Sept 28, 2023 Normal LV function Trivial MR  No structural cardiac issues to explain his DOE Likely deconditioning  Wt is 246 lbs   Had an unremarkable event monitor in 2021   Has rare near syncopal episodes    Aug. 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 12, 2024  Theodore Lamb is seen for follow up of his HTN,  moderate nonobstructive  CAD  Episodes of near syncope  Wt is 228 lbs  No CP No syncope  Occasional palpitations   Has not been using his CPAP  Sees [facility] . Advised him to start using it regularly    Apri 1, 2025 Theodore Lamb is seen for follow up of his HTN, episoes of syncope  Has OSA , has not been using his CPAP regularly  Has been having spells   Weak muscles, face feels hot and prickly,  Chest pressure ,  These occur spontaneously, not related to exercise   We had discussed more exercise at his last visit  Wt is 236 ( up 8 lbs from last year )  Mild nausea  Was in the ER with chest pressure 2 weeks ago with extremely elevated BP   Has had some ankle swelling , amlodipine was stopped . Hydrochlorothiazide was added   Has OSA, uses his CPAP .   Does not drink ETOH.   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   atorvastatin (LIPITOR) 20 MG tablet Take 1 tablet (20 mg total) by mouth daily.   ezetimibe (ZETIA) 10 MG tablet Take 1 tablet (10 mg total) by mouth daily.   hydrochlorothiazide (MICROZIDE) 12.5 MG capsule Take 1 capsule (12.5 mg total) by mouth daily.   losartan (COZAAR) 100 MG tablet Take 0.5 tablets (50 mg total)  by mouth 2 (two) times daily.   metoprolol  tartrate (LOPRESSOR) 100 MG tablet Take 1 tablet by mouth two hours prior to scan     Allergies:   Bupropion, Candesartan cilexetil, and Leuprolide   Social History   Socioeconomic History   Marital status: Divorced    Spouse name: Not on file   Number of children: Not on file   Years of education: Not on file   Highest education level: Not on file  Occupational History   Not on file  Tobacco Use   Smoking status: Every Day   Smokeless tobacco: Current   Tobacco comments:    frequency  1 PPD . Smoking : smoking electric cigaretes . Alcohol : no     Substance and Sexual Activity   Alcohol use: No    Alcohol/week: 0.0 standard drinks of alcohol   Drug use: No   Sexual activity: Not on file  Other Topics Concern   Not on file  Social History Narrative   History of smoking cigarettes :Current smoker,   Frequency : 1 PPD Smoking: smoking electric  cigaretes. Alcohol: no.   Social Drivers of Corporate investment banker Strain: Not on file  Food Insecurity: Low Risk  (09/26/2023)   Received from [facility]   Hunger Vital Sign    Worried About Running Out of Food in the Last Year: Never true    Ran Out of Food in the Last Year: Never true  Transportation Needs: No Transportation Needs (09/26/2023)   Received from [facility]    In the past 12 months, has lack of reliable transportation kept you from medical appointments, meetings, work or from getting things needed for daily living? : No  Physical Activity: Not on file  Stress: Not on file  Social Connections: Not on file     Family History: The patient's family history includes Colon cancer in an other family member; Testicular cancer in his paternal uncle.  ROS:   Please see the history of present illness.     All other systems reviewed and are negative.  EKGs/Labs/Other Studies Reviewed:    The following studies were reviewed today:      Recent  Labs: 02/20/2023: Magnesium 1.9 10/15/2023: ALT 25 12/22/2023: B Natriuretic Peptide 38.0; BUN 20; Creatinine, Ser 1.39; Hemoglobin 14.6; Platelets 216; Potassium 3.9; Sodium 135  Recent Lipid Panel    Component Value Date/Time   CHOL 133 08/13/2023 1021   TRIG 95 08/13/2023 1021   HDL 44 08/13/2023 1021   CHOLHDL 3.0 08/13/2023 1021   LDLCALC 71 08/13/2023 1021    Physical Exam:        Physical Exam: Blood pressure 128/72, pulse 73, height 5\' 8"  (1.727 m), weight 236 lb 3.2 oz (107.1 kg), SpO2 95%.       GEN:  Well nourished, well developed in no acute distress HEENT: Normal NECK: No JVD; No carotid bruits LYMPHATICS: No lymphadenopathy CARDIAC: RRR , no murmurs, rubs, gallops RESPIRATORY:  Clear to auscultation without rales, wheezing or rhonchi  ABDOMEN: Soft, non-tender, non-distended MUSCULOSKELETAL:  No edema; No deformity  SKIN: Warm and dry NEUROLOGIC:  Alert and oriented x 3    EKG:           ASSESSMENT:    1. Chest pressure   2. Coronary artery disease involving native coronary artery of native heart without angina pectoris         PLAN:       1.  Hypertension:    BP is well controlled.  2.      Chest pressure: [place] is seen for further evaluation of some unusual symptoms which include chest pain and pressure, shortness of breath, diaphoresis.  He also has associated nausea.  He has known moderate coronary artery disease by coronary CT angiogram several years ago.  He admits to not taking good care of himself.  He is gained weight.    Will schedule him for a coronary CT angiogram for further evaluation of this chest pressure.  I have encouraged him to work on and improve diet and exercise and weight loss program.   . 3.  Shortness of breath with exertion.      4.  Daytime sleepiness.      Has known obstructive sleep apnea.  5.  Leg edema :    Leg edema is better after he stopped them amlodipine.  6.  Hyperlipidemia:           Medication Adjustments/Labs and Tests Ordered: Current medicines are reviewed at length with the patient today.  Concerns regarding medicines are outlined above.  Orders Placed This Encounter  Procedures   CT CORONARY MORPH W/CTA COR W/SCORE W/CA W/CM &/OR WO/CM   Meds ordered this encounter  Medications   metoprolol tartrate (LOPRESSOR) 100 MG tablet    Sig: Take 1 tablet by mouth two hours prior to scan    Dispense:  1 tablet    Refill:  0      Patient Instructions  Testing/Procedures: Coronary CT Angiogram Cardiac CT Angiography (CTA), is a special type of CT scan that uses a computer to produce multi-dimensional views of major blood vessels throughout the body. In CT angiography, a contrast material is injected through an IV to help visualize the blood vessels  Follow-Up: At [facility], you and your health needs are our priority.  As part of our continuing mission to provide you with exceptional heart care, our providers are all part of one team.  This team includes your primary Cardiologist (physician) and Advanced Practice Providers or APPs (Physician Assistants and Nurse Practitioners) who all work together to provide you with the care you need, when you need it.  Your next appointment:   6 month(s)  Provider:   Kristeen Miss, MD  or APP   Other Instructions   Your cardiac CT will be scheduled at:   [facility] [address] [place], [place] [address] [phone]  please arrive at the [facility] (Entrance C2) of [facility] 30 minutes prior to test start time. You can use the FREE valet parking offered at entrance C (encouraged to control the heart rate for the test)  Proceed to the [facility] Radiology Department (first floor) to check-in and test prep.  All radiology patients and guests should use entrance C2 at [facility], accessed from [facility], even though the hospital's physical  address listed is [address].     Please follow these instructions carefully (unless otherwise directed):  An IV will be required for this test and Nitroglycerin will be given.  Hold all erectile dysfunction medications at least 3 days (72 hrs) prior to test. (Ie viagra, cialis, sildenafil, tadalafil, etc)   On the Night Before the Test: Be sure to Drink plenty of water. Do not consume any caffeinated/decaffeinated beverages or chocolate 12 hours prior to your test. Do not take any antihistamines 12 hours prior to your test.  On the Day of the Test: Drink plenty  of water until 1 hour prior to the test. Do not eat any food 1 hour prior to test. You may take your regular medications prior to the test.  Take metoprolol (Lopressor) two hours prior to test. If you take Hydrochlorothiazide, please HOLD on the morning of the test. Patients who wear a continuous glucose monitor MUST remove the device prior to scanning.       After the Test: Drink plenty of water. After receiving IV contrast, you may experience a mild flushed feeling. This is normal. On occasion, you may experience a mild rash up to 24 hours after the test. This is not dangerous. If this occurs, you can take Benadryl 25 mg, Zyrtec, Claritin, or Allegra and increase your fluid intake. (Patients taking Tikosyn should avoid Benadryl, and may take Zyrtec, Claritin, or Allegra) If you experience trouble breathing, this can be serious. If it is severe call 911 IMMEDIATELY. If it is mild, please call our office.  We will call to schedule your test 2-4 weeks out understanding that some insurance companies will need an authorization prior to the service being performed.   For more information and frequently asked questions, please visit our website : [email or web address]  For non-scheduling related questions, please contact the cardiac imaging nurse navigator should you have any questions/concerns: Cardiac  Imaging Nurse Navigators Direct Office Dial: [phone]   For scheduling needs, including cancellations and rescheduling, please call [place], [phone].    1st Floor: - Lobby - Registration  - Pharmacy  - Lab - Cafe  2nd Floor: - PV Lab - Diagnostic Testing (echo, CT, nuclear med)  3rd Floor: - Vacant  4th Floor: - TCTS (cardiothoracic surgery) - AFib Clinic - Structural Heart Clinic - Vascular Surgery  - Vascular Ultrasound  5th Floor: - HeartCare Cardiology (general and EP) - Clinical Pharmacy for coumadin, hypertension, lipid, weight-loss medications, and med management appointments    Valet parking services will be available as well.     Signed, Kristeen Miss, MD  12/31/2023 1:31 PM    [facility]

## 2023-12-31 ENCOUNTER — Encounter: Payer: Self-pay | Admitting: Cardiovascular Disease

## 2023-12-31 ENCOUNTER — Ambulatory Visit: Attending: Cardiovascular Disease | Admitting: Cardiovascular Disease

## 2023-12-31 VITALS — BP 128/72 | HR 73 | Ht 68.0 in | Wt 236.2 lb

## 2023-12-31 DIAGNOSIS — R0789 Other chest pain: Secondary | ICD-10-CM | POA: Diagnosis not present

## 2023-12-31 DIAGNOSIS — I2510 Atherosclerotic heart disease of native coronary artery without angina pectoris: Secondary | ICD-10-CM

## 2023-12-31 DIAGNOSIS — R339 Retention of urine, unspecified: Secondary | ICD-10-CM | POA: Diagnosis not present

## 2023-12-31 MED ORDER — METOPROLOL TARTRATE 100 MG PO TABS: ORAL_TABLET | ORAL | 0 refills | Status: AC

## 2023-12-31 NOTE — Patient Instructions (Addendum)
 Testing/Procedures: Coronary CT Angiogram Cardiac CT Angiography (CTA), is a special type of CT scan that uses a computer to produce multi-dimensional views of major blood vessels throughout the body. In CT angiography, a contrast material is injected through an IV to help visualize the blood vessels  Follow-Up: At [facility], you and your health needs are our priority.  As part of our continuing mission to provide you with exceptional heart care, our providers are all part of one team.  This team includes your primary Cardiologist (physician) and Advanced Practice Providers or APPs (Physician Assistants and Nurse Practitioners) who all work together to provide you with the care you need, when you need it.  Your next appointment:   6 month(s)  Provider:   Kristeen Miss, MD  or APP   Other Instructions   Your cardiac CT will be scheduled at:   [facility] [address] [place], [place] [address] [phone]  please arrive at the [facility] and Children's Entrance (Entrance C2) of [facility] 30 minutes prior to test start time. You can use the FREE valet parking offered at entrance C (encouraged to control the heart rate for the test)  Proceed to the [facility] Radiology Department (first floor) to check-in and test prep.  All radiology patients and guests should use entrance C2 at [facility], accessed from [facility], even though the hospital's physical address listed is [address]     Please follow these instructions carefully (unless otherwise directed):  An IV will be required for this test and Nitroglycerin will be given.  Hold all erectile dysfunction medications at least 3 days (72 hrs) prior to test. (Ie viagra, cialis, sildenafil, tadalafil, etc)   On the Night Before the Test: Be sure to Drink plenty of water. Do not consume any caffeinated/decaffeinated beverages or chocolate 12 hours prior to your  test. Do not take any antihistamines 12 hours prior to your test.  On the Day of the Test: Drink plenty of water until 1 hour prior to the test. Do not eat any food 1 hour prior to test. You may take your regular medications prior to the test.  Take metoprolol (Lopressor) two hours prior to test. If you take Hydrochlorothiazide, please HOLD on the morning of the test. Patients who wear a continuous glucose monitor MUST remove the device prior to scanning.       After the Test: Drink plenty of water. After receiving IV contrast, you may experience a mild flushed feeling. This is normal. On occasion, you may experience a mild rash up to 24 hours after the test. This is not dangerous. If this occurs, you can take Benadryl 25 mg, Zyrtec, Claritin, or Allegra and increase your fluid intake. (Patients taking Tikosyn should avoid Benadryl, and may take Zyrtec, Claritin, or Allegra) If you experience trouble breathing, this can be serious. If it is severe call 911 IMMEDIATELY. If it is mild, please call our office.  We will call to schedule your test 2-4 weeks out understanding that some insurance companies will need an authorization prior to the service being performed.   For more information and frequently asked questions, please visit our website : [email or web address]  For non-scheduling related questions, please contact the cardiac imaging nurse navigator should you have any questions/concerns: Cardiac Imaging Nurse Navigators Direct Office Dial: [phone]   For scheduling needs, including cancellations and rescheduling, please call [place], [phone].    1st Floor: - Lobby -  Registration  - Pharmacy  - Lab - Cafe  2nd Floor: - PV Lab - Diagnostic Testing (echo, CT, nuclear med)  3rd Floor: - Vacant  4th Floor: - TCTS (cardiothoracic surgery) - AFib Clinic - Structural Heart Clinic - Vascular Surgery  - Vascular Ultrasound  5th Floor: - HeartCare  Cardiology (general and EP) - Clinical Pharmacy for coumadin, hypertension, lipid, weight-loss medications, and med management appointments    Valet parking services will be available as well.

## 2024-01-10 ENCOUNTER — Telehealth: Payer: Self-pay | Admitting: Cardiovascular Disease

## 2024-01-10 NOTE — Telephone Encounter (Signed)
 Returned call to patient to inform him that the A1c was a dictation error and Dr Elease Hashimoto will remove it from the chart. He states that he bought a new [employer] BP cuff because his was old. Advised he keep checking his BP and let us know if there was an issue.

## 2024-01-10 NOTE — Telephone Encounter (Signed)
 Spoke with patient and states at his last OV with Dr. Elease Hashimoto informed him his A1 C was elevated. He states he could not find it in his chart.  I looked through his labs and I did not see a recent A1C.  He also called to see if we calibrated blood pressure monitors. Informed patient home monitors really can not be calibrated. He can monitor his BP at home and if he have concerns we can get him in for NV and check BP.

## 2024-01-10 NOTE — Telephone Encounter (Signed)
 A1C is elevated wanting to discuss with the nurse, along with some other things. Please advise

## 2024-01-15 DIAGNOSIS — D2262 Melanocytic nevi of left upper limb, including shoulder: Secondary | ICD-10-CM | POA: Diagnosis not present

## 2024-01-15 DIAGNOSIS — D2272 Melanocytic nevi of left lower limb, including hip: Secondary | ICD-10-CM | POA: Diagnosis not present

## 2024-01-15 DIAGNOSIS — L820 Inflamed seborrheic keratosis: Secondary | ICD-10-CM | POA: Diagnosis not present

## 2024-01-15 DIAGNOSIS — D485 Neoplasm of uncertain behavior of skin: Secondary | ICD-10-CM | POA: Diagnosis not present

## 2024-01-15 DIAGNOSIS — L570 Actinic keratosis: Secondary | ICD-10-CM | POA: Diagnosis not present

## 2024-01-15 DIAGNOSIS — D225 Melanocytic nevi of trunk: Secondary | ICD-10-CM | POA: Diagnosis not present

## 2024-01-15 DIAGNOSIS — L821 Other seborrheic keratosis: Secondary | ICD-10-CM | POA: Diagnosis not present

## 2024-01-15 DIAGNOSIS — L812 Freckles: Secondary | ICD-10-CM | POA: Diagnosis not present

## 2024-01-15 DIAGNOSIS — D2271 Melanocytic nevi of right lower limb, including hip: Secondary | ICD-10-CM | POA: Diagnosis not present

## 2024-02-20 DIAGNOSIS — N529 Male erectile dysfunction, unspecified: Secondary | ICD-10-CM | POA: Diagnosis not present

## 2024-02-20 DIAGNOSIS — C61 Malignant neoplasm of prostate: Secondary | ICD-10-CM | POA: Diagnosis not present

## 2024-02-20 DIAGNOSIS — N35014 Post-traumatic urethral stricture, male, unspecified: Secondary | ICD-10-CM | POA: Diagnosis not present

## 2024-02-22 ENCOUNTER — Encounter (HOSPITAL_COMMUNITY): Payer: Self-pay

## 2024-02-25 ENCOUNTER — Telehealth (HOSPITAL_COMMUNITY): Payer: Self-pay | Admitting: *Deleted

## 2024-02-25 NOTE — Telephone Encounter (Signed)
 Patient returning call upcoming cardiac imaging study; pt verbalizes understanding of appt date/time, parking situation and where to check in, pre-test NPO status and medications ordered, and verified current allergies; name and call back number provided for further questions should they arise  Larey Brick RN Navigator Cardiac Imaging [facility] [phone] office [phone] cell  Patient to take 100mg  metoprolol tartrate two hours prior to his cardiac CT scan.

## 2024-02-25 NOTE — Telephone Encounter (Signed)
 Attempted to call patient regarding upcoming cardiac CT appointment. Left message on voicemail with name and callback number  Larey Brick RN Navigator Cardiac Imaging [facility] [phone] Office [phone] Cell

## 2024-02-26 ENCOUNTER — Ambulatory Visit (HOSPITAL_BASED_OUTPATIENT_CLINIC_OR_DEPARTMENT_OTHER)
Admission: RE | Admit: 2024-02-26 | Discharge: 2024-02-26 | Disposition: A | Discharge status: Home / Self Care | Payer: Self-pay | Source: Ambulatory Visit | Attending: Cardiovascular Disease

## 2024-02-26 ENCOUNTER — Other Ambulatory Visit: Payer: Self-pay | Admitting: Cardiovascular Disease

## 2024-02-26 ENCOUNTER — Ambulatory Visit (HOSPITAL_COMMUNITY)
Admission: RE | Admit: 2024-02-26 | Discharge: 2024-02-26 | Disposition: A | Discharge status: Home / Self Care | Source: Ambulatory Visit | Attending: Cardiovascular Disease | Admitting: Cardiovascular Disease

## 2024-02-26 DIAGNOSIS — R931 Abnormal findings on diagnostic imaging of heart and coronary circulation: Secondary | ICD-10-CM

## 2024-02-26 DIAGNOSIS — R0789 Other chest pain: Secondary | ICD-10-CM | POA: Insufficient documentation

## 2024-02-26 DIAGNOSIS — I700 Atherosclerosis of aorta: Secondary | ICD-10-CM | POA: Diagnosis not present

## 2024-02-26 DIAGNOSIS — I2510 Atherosclerotic heart disease of native coronary artery without angina pectoris: Secondary | ICD-10-CM | POA: Diagnosis not present

## 2024-02-26 MED ORDER — NITROGLYCERIN 0.4 MG SL SUBL
0.8000 mg | SUBLINGUAL_TABLET | Freq: Once | SUBLINGUAL | Status: AC
  Administered 2024-02-26: 0.8 mg via SUBLINGUAL

## 2024-02-26 MED ORDER — NITROGLYCERIN 0.4 MG SL SUBL
SUBLINGUAL_TABLET | SUBLINGUAL | Status: AC
  Filled 2024-02-26: qty 2

## 2024-02-26 MED ORDER — NITROGLYCERIN 0.4 MG SL SUBL
SUBLINGUAL_TABLET | SUBLINGUAL | Status: AC
Start: 2024-02-26 — End: ?
  Filled 2024-02-26: qty 2

## 2024-02-26 MED ORDER — IOHEXOL 350 MG/ML SOLN
100.0000 mL | Freq: Once | INTRAVENOUS | Status: AC | PRN
  Administered 2024-02-26: 100 mL via INTRAVENOUS

## 2024-02-26 NOTE — Progress Notes (Unsigned)
 CT FFR ordered.  Theodore Spruce T. Rolm Clos, MD, [facility]  [facility]  [address] [place], [place] [address] [phone]  9:39 PM

## 2024-02-27 ENCOUNTER — Ambulatory Visit: Payer: Self-pay | Admitting: Internal Medicine

## 2024-03-13 ENCOUNTER — Encounter: Payer: Self-pay | Admitting: Cardiovascular Disease

## 2024-03-13 DIAGNOSIS — R400 Somnolence: Secondary | ICD-10-CM

## 2024-03-13 DIAGNOSIS — I2510 Atherosclerotic heart disease of native coronary artery without angina pectoris: Secondary | ICD-10-CM

## 2024-03-13 DIAGNOSIS — G4733 Obstructive sleep apnea (adult) (pediatric): Secondary | ICD-10-CM

## 2024-04-02 DIAGNOSIS — N1831 Chronic kidney disease, stage 3a: Secondary | ICD-10-CM | POA: Diagnosis not present

## 2024-04-02 DIAGNOSIS — N5235 Erectile dysfunction following radiation therapy: Secondary | ICD-10-CM | POA: Diagnosis not present

## 2024-04-02 DIAGNOSIS — Z8042 Family history of malignant neoplasm of prostate: Secondary | ICD-10-CM | POA: Diagnosis not present

## 2024-04-02 DIAGNOSIS — E669 Obesity, unspecified: Secondary | ICD-10-CM | POA: Diagnosis not present

## 2024-04-02 DIAGNOSIS — N35914 Unspecified anterior urethral stricture, male: Secondary | ICD-10-CM | POA: Diagnosis not present

## 2024-04-02 DIAGNOSIS — C61 Malignant neoplasm of prostate: Secondary | ICD-10-CM | POA: Diagnosis not present

## 2024-04-07 ENCOUNTER — Telehealth: Payer: Self-pay | Admitting: *Deleted

## 2024-04-07 DIAGNOSIS — I10 Essential (primary) hypertension: Secondary | ICD-10-CM

## 2024-04-07 DIAGNOSIS — R400 Somnolence: Secondary | ICD-10-CM

## 2024-04-07 DIAGNOSIS — G4733 Obstructive sleep apnea (adult) (pediatric): Secondary | ICD-10-CM

## 2024-04-07 DIAGNOSIS — I2510 Atherosclerotic heart disease of native coronary artery without angina pectoris: Secondary | ICD-10-CM

## 2024-04-07 NOTE — Telephone Encounter (Signed)
 Per Dr Shlomo, place an order to DME ASAP for a mask fitting. Order placed, patient notified.

## 2024-04-20 DIAGNOSIS — H9190 Unspecified hearing loss, unspecified ear: Secondary | ICD-10-CM | POA: Diagnosis not present

## 2024-04-20 DIAGNOSIS — E669 Obesity, unspecified: Secondary | ICD-10-CM | POA: Diagnosis not present

## 2024-04-20 DIAGNOSIS — Z0000 Encounter for general adult medical examination without abnormal findings: Secondary | ICD-10-CM | POA: Diagnosis not present

## 2024-04-20 DIAGNOSIS — R7303 Prediabetes: Secondary | ICD-10-CM | POA: Diagnosis not present

## 2024-04-20 DIAGNOSIS — N1831 Chronic kidney disease, stage 3a: Secondary | ICD-10-CM | POA: Diagnosis not present

## 2024-04-20 DIAGNOSIS — Z6834 Body mass index (BMI) 34.0-34.9, adult: Secondary | ICD-10-CM | POA: Diagnosis not present

## 2024-04-20 DIAGNOSIS — E7800 Pure hypercholesterolemia, unspecified: Secondary | ICD-10-CM | POA: Diagnosis not present

## 2024-04-20 DIAGNOSIS — Z23 Encounter for immunization: Secondary | ICD-10-CM | POA: Diagnosis not present

## 2024-04-20 DIAGNOSIS — I10 Essential (primary) hypertension: Secondary | ICD-10-CM | POA: Diagnosis not present

## 2024-04-24 ENCOUNTER — Encounter (INDEPENDENT_AMBULATORY_CARE_PROVIDER_SITE_OTHER): Payer: Self-pay

## 2024-05-08 DIAGNOSIS — C61 Malignant neoplasm of prostate: Secondary | ICD-10-CM | POA: Diagnosis not present

## 2024-05-08 DIAGNOSIS — N1831 Chronic kidney disease, stage 3a: Secondary | ICD-10-CM | POA: Diagnosis not present

## 2024-05-08 DIAGNOSIS — Z8042 Family history of malignant neoplasm of prostate: Secondary | ICD-10-CM | POA: Diagnosis not present

## 2024-05-08 DIAGNOSIS — N35914 Unspecified anterior urethral stricture, male: Secondary | ICD-10-CM | POA: Diagnosis not present

## 2024-05-08 DIAGNOSIS — N5235 Erectile dysfunction following radiation therapy: Secondary | ICD-10-CM | POA: Diagnosis not present

## 2024-05-08 DIAGNOSIS — Z6832 Body mass index (BMI) 32.0-32.9, adult: Secondary | ICD-10-CM | POA: Diagnosis not present

## 2024-05-08 DIAGNOSIS — N35919 Unspecified urethral stricture, male, unspecified site: Secondary | ICD-10-CM | POA: Diagnosis not present

## 2024-05-08 DIAGNOSIS — E66811 Obesity, class 1: Secondary | ICD-10-CM | POA: Diagnosis not present

## 2024-05-22 DIAGNOSIS — R339 Retention of urine, unspecified: Secondary | ICD-10-CM | POA: Diagnosis not present

## 2024-05-28 DIAGNOSIS — C61 Malignant neoplasm of prostate: Secondary | ICD-10-CM | POA: Diagnosis not present

## 2024-05-28 DIAGNOSIS — R339 Retention of urine, unspecified: Secondary | ICD-10-CM | POA: Diagnosis not present

## 2024-06-09 ENCOUNTER — Encounter (INDEPENDENT_AMBULATORY_CARE_PROVIDER_SITE_OTHER): Payer: Self-pay

## 2024-06-13 ENCOUNTER — Encounter (HOSPITAL_COMMUNITY): Payer: Self-pay

## 2024-06-13 ENCOUNTER — Other Ambulatory Visit: Payer: Self-pay

## 2024-06-13 ENCOUNTER — Emergency Department (HOSPITAL_COMMUNITY)

## 2024-06-13 ENCOUNTER — Emergency Department (HOSPITAL_COMMUNITY)
Admission: EM | Admit: 2024-06-13 | Discharge: 2024-06-13 | Disposition: A | Discharge status: Home / Self Care | Attending: Emergency Medicine | Admitting: Emergency Medicine

## 2024-06-13 DIAGNOSIS — R29818 Other symptoms and signs involving the nervous system: Secondary | ICD-10-CM | POA: Diagnosis not present

## 2024-06-13 DIAGNOSIS — R42 Dizziness and giddiness: Secondary | ICD-10-CM | POA: Diagnosis not present

## 2024-06-13 DIAGNOSIS — Z79899 Other long term (current) drug therapy: Secondary | ICD-10-CM | POA: Insufficient documentation

## 2024-06-13 DIAGNOSIS — I10 Essential (primary) hypertension: Secondary | ICD-10-CM | POA: Insufficient documentation

## 2024-06-13 DIAGNOSIS — R5383 Other fatigue: Secondary | ICD-10-CM | POA: Diagnosis not present

## 2024-06-13 DIAGNOSIS — R9082 White matter disease, unspecified: Secondary | ICD-10-CM | POA: Diagnosis not present

## 2024-06-13 DIAGNOSIS — I2510 Atherosclerotic heart disease of native coronary artery without angina pectoris: Secondary | ICD-10-CM | POA: Diagnosis not present

## 2024-06-13 DIAGNOSIS — R0602 Shortness of breath: Secondary | ICD-10-CM | POA: Diagnosis not present

## 2024-06-13 DIAGNOSIS — I4510 Unspecified right bundle-branch block: Secondary | ICD-10-CM | POA: Diagnosis not present

## 2024-06-13 DIAGNOSIS — R519 Headache, unspecified: Secondary | ICD-10-CM | POA: Insufficient documentation

## 2024-06-13 DIAGNOSIS — R110 Nausea: Secondary | ICD-10-CM | POA: Diagnosis not present

## 2024-06-13 DIAGNOSIS — R7989 Other specified abnormal findings of blood chemistry: Secondary | ICD-10-CM | POA: Insufficient documentation

## 2024-06-13 DIAGNOSIS — R918 Other nonspecific abnormal finding of lung field: Secondary | ICD-10-CM | POA: Diagnosis not present

## 2024-06-13 HISTORY — DX: Essential (primary) hypertension: I10

## 2024-06-13 LAB — I-STAT CHEM 8, ED
BUN: 20 mg/dL (ref 8–23)
Calcium, Ion: 1.16 mmol/L (ref 1.15–1.40)
Chloride: 100 mmol/L (ref 98–111)
Creatinine, Ser: 1.4 mg/dL — ABNORMAL HIGH (ref 0.61–1.24)
Glucose, Bld: 101 mg/dL — ABNORMAL HIGH (ref 70–99)
HCT: 45 % (ref 39.0–52.0)
Hemoglobin: 15.3 g/dL (ref 13.0–17.0)
Potassium: 3.6 mmol/L (ref 3.5–5.1)
Sodium: 137 mmol/L (ref 135–145)
TCO2: 25 mmol/L (ref 22–32)

## 2024-06-13 LAB — URINALYSIS, ROUTINE W REFLEX MICROSCOPIC
Bilirubin Urine: NEGATIVE
Glucose, UA: NEGATIVE mg/dL
Hgb urine dipstick: NEGATIVE
Ketones, ur: NEGATIVE mg/dL
Leukocytes,Ua: NEGATIVE
Nitrite: NEGATIVE
Protein, ur: NEGATIVE mg/dL
Specific Gravity, Urine: 1.013 (ref 1.005–1.030)
pH: 6 (ref 5.0–8.0)

## 2024-06-13 LAB — CBC
HCT: 45.3 % (ref 39.0–52.0)
Hemoglobin: 15.1 g/dL (ref 13.0–17.0)
MCH: 29.9 pg (ref 26.0–34.0)
MCHC: 33.3 g/dL (ref 30.0–36.0)
MCV: 89.7 fL (ref 80.0–100.0)
Platelets: 262 K/uL (ref 150–400)
RBC: 5.05 MIL/uL (ref 4.22–5.81)
RDW: 12.6 % (ref 11.5–15.5)
WBC: 7.7 K/uL (ref 4.0–10.5)
nRBC: 0 % (ref 0.0–0.2)

## 2024-06-13 LAB — COMPREHENSIVE METABOLIC PANEL WITH GFR
ALT: 16 U/L (ref 0–44)
AST: 18 U/L (ref 15–41)
Albumin: 3.3 g/dL — ABNORMAL LOW (ref 3.5–5.0)
Alkaline Phosphatase: 45 U/L (ref 38–126)
Anion gap: 11 (ref 5–15)
BUN: 18 mg/dL (ref 8–23)
CO2: 24 mmol/L (ref 22–32)
Calcium: 9.1 mg/dL (ref 8.9–10.3)
Chloride: 99 mmol/L (ref 98–111)
Creatinine, Ser: 1.22 mg/dL (ref 0.61–1.24)
GFR, Estimated: >60 mL/min (ref >60)
Glucose, Bld: 102 mg/dL — ABNORMAL HIGH (ref 70–99)
Potassium: 3.7 mmol/L (ref 3.5–5.1)
Sodium: 134 mmol/L — ABNORMAL LOW (ref 135–145)
Total Bilirubin: 0.8 mg/dL (ref 0.0–1.2)
Total Protein: 6.6 g/dL (ref 6.5–8.1)

## 2024-06-13 LAB — RESP PANEL BY RT-PCR (RSV, FLU A&B, COVID)  RVPGX2
Influenza A by PCR: NEGATIVE
Influenza B by PCR: NEGATIVE
Resp Syncytial Virus by PCR: NEGATIVE
SARS Coronavirus 2 by RT PCR: NEGATIVE

## 2024-06-13 LAB — CBG MONITORING, ED: Glucose-Capillary: 111 mg/dL — ABNORMAL HIGH (ref 70–99)

## 2024-06-13 LAB — TROPONIN I (HIGH SENSITIVITY): Troponin I (High Sensitivity): 6 ng/L (ref <18)

## 2024-06-13 MED ORDER — LOSARTAN POTASSIUM 50 MG PO TABS
100.0000 mg | ORAL_TABLET | Freq: Once | ORAL | Status: AC
  Administered 2024-06-13: 100 mg via ORAL
  Filled 2024-06-13: qty 2

## 2024-06-13 MED ORDER — AMLODIPINE BESYLATE 5 MG PO TABS
2.5000 mg | ORAL_TABLET | Freq: Once | ORAL | Status: AC
  Administered 2024-06-13: 2.5 mg via ORAL
  Filled 2024-06-13: qty 1

## 2024-06-13 MED ORDER — ONDANSETRON HCL 4 MG/2ML IJ SOLN
4.0000 mg | Freq: Once | INTRAMUSCULAR | Status: AC
  Administered 2024-06-13: 4 mg via INTRAVENOUS
  Filled 2024-06-13: qty 2

## 2024-06-13 NOTE — ED Notes (Signed)
 Patient transported to CT

## 2024-06-13 NOTE — ED Notes (Signed)
 Pt endorses increased feelings of anxiety over the past few days

## 2024-06-13 NOTE — Discharge Instructions (Signed)
 Your workup today was very reassuring, please continue to take your home blood pressure medication, follow-up closely with your primary care doctor.  Please return to the emergency department if you have chest pain, shortness of breath, decreased urine output, new numbness, tingling, vision changes.

## 2024-06-13 NOTE — ED Triage Notes (Signed)
 Pt complaining of hypertension and SOB that started yesterday. Pt states he has not skipped any BP medication. Pt complaining of fatigue, nausea, and feeling light headed. Not complaining of any vision changes.

## 2024-06-13 NOTE — ED Provider Notes (Signed)
 Theodore Lamb Provider Note   CSN: [id number] Arrival date & time: 06/13/24  9148     Patient presents with: Hypertension and Shortness of Breath   Theodore Lamb is a 78 y.o. male past medical history seen for CAD, obstructive sleep apnea, hypertension, hyperlipidemia who presents concern for hypertension, shortness of breath, chest tightness that started yesterday.  He reports no missed doses of blood pressure medication but did not take it this morning.  Endorses feeling fatigue, nausea, lightheaded.  He reports mild headache.  He denies any vision changes.  He denies any new numbness, tingling.    Hypertension Associated symptoms include shortness of breath.  Shortness of Breath      Prior to Admission medications   Medication Sig Start Date End Date Taking? Authorizing Provider  amLODipine  (NORVASC ) 2.5 MG tablet Take 1 tablet (2.5 mg total) by mouth daily. 05/13/23   Nahser, Aleene PARAS, MD  atorvastatin  (LIPITOR) 20 MG tablet Take 1 tablet (20 mg total) by mouth daily. 10/30/23 12/31/23  Wyn Jackee VEAR Mickey., NP  ezetimibe  (ZETIA ) 10 MG tablet Take 1 tablet (10 mg total) by mouth daily. 10/30/23   Wyn Jackee VEAR Mickey., NP  hydrochlorothiazide  (MICROZIDE ) 12.5 MG capsule Take 1 capsule (12.5 mg total) by mouth daily. 10/30/23 01/28/24  Wyn Jackee VEAR Mickey., NP  losartan  (COZAAR ) 100 MG tablet Take 0.5 tablets (50 mg total) by mouth 2 (two) times daily. 10/30/23   Wyn Jackee VEAR Mickey., NP  metoprolol  tartrate (LOPRESSOR ) 100 MG tablet Take 1 tablet by mouth two hours prior to scan 12/31/23   Nahser, Aleene PARAS, MD    Allergies: Bupropion, Candesartan cilexetil, and Leuprolide     Review of Systems  Respiratory:  Positive for shortness of breath.     Updated Vital Signs BP 133/88   Pulse 63   Temp 97.9 F (36.6 C) (Oral)   Resp 14   Ht 5' 8 (1.727 m)   Wt 96.2 kg   SpO2 97%   BMI 32.23 kg/m   Physical Exam  (all labs ordered are listed,  but only abnormal results are displayed) Labs Reviewed  COMPREHENSIVE METABOLIC PANEL WITH GFR - Abnormal; Notable for the following components:      Result Value   Sodium 134 (*)    Glucose, Bld 102 (*)    Albumin 3.3 (*)    All other components within normal limits  CBG MONITORING, ED - Abnormal; Notable for the following components:   Glucose-Capillary 111 (*)    All other components within normal limits  I-STAT CHEM 8, ED - Abnormal; Notable for the following components:   Creatinine, Ser 1.40 (*)    Glucose, Bld 101 (*)    All other components within normal limits  RESP PANEL BY RT-PCR (RSV, FLU A&B, COVID)  RVPGX2  CBC  URINALYSIS, ROUTINE W REFLEX MICROSCOPIC  TROPONIN I (HIGH SENSITIVITY)    EKG: EKG Interpretation Date/Time:  Saturday June 13 2024 09:05:06 EDT Ventricular Rate:  56 PR Interval:  211 QRS Duration:  112 QT Interval:  426 QTC Calculation: 412 R Axis:   62  Text Interpretation: Sinus rhythm Incomplete right bundle branch block No significant change since 1/25 Confirmed by Towana Sharper [phone]) on 06/13/2024 9:13:49 AM  Radiology: DG Chest Portable 1 View Result Date: 06/13/2024 EXAM: 1 VIEW XRAY OF THE CHEST 06/13/2024 10:41:00 AM COMPARISON: 12/22/2023 CLINICAL HISTORY: Pt complaining of hypertension and SOB that started yesterday. Pt states he has  not skipped any BP medication. Pt complaining of fatigue, nausea, and feeling light headed. FINDINGS: LUNGS AND PLEURA: Minimal airspace opacity at the left base, likely atelectasis, has improved. No pulmonary edema. No pleural effusion. No pneumothorax. HEART AND MEDIASTINUM: No acute abnormality of the cardiac and mediastinal silhouettes. Aortic atherosclerosis. BONES AND SOFT TISSUES: No acute osseous abnormality. IMPRESSION: 1. Minimal airspace opacity at the left base, likely atelectasis, has improved. Electronically signed by: Lonni Necessary MD 06/13/2024 11:29 AM EDT RP Workstation: [id number]    CT Head Wo Contrast Result Date: 06/13/2024 EXAM: CT HEAD WITHOUT CONTRAST 06/13/2024 09:49:03 AM TECHNIQUE: CT of the head was performed without the administration of intravenous contrast. Automated exposure control, iterative reconstruction, and/or weight based adjustment of the mA/kV was utilized to reduce the radiation dose to as low as reasonably achievable. COMPARISON: 12/22/2023 CLINICAL HISTORY: Headache, neuro deficit. Table formatting from the original note was not included. Non con. Pt complaining of hypertension and SOB that started yesterday. Pt states he has not skipped any BP medication. Pt complaining of fatigue, nausea, and feeling light headed. Not complaining of any vision changes. FINDINGS: BRAIN AND VENTRICLES: No acute hemorrhage. No evidence of acute infarct. No hydrocephalus. No extra-axial collection. No mass effect or midline shift. Prominence of the sulci and ventricles compatible with brain atrophy. Hypoattenuating foci in the cerebral white matter, most likely representing chronic small vessel disease. ORBITS: No acute abnormality. SINUSES: No acute abnormality. SOFT TISSUES AND SKULL: No acute soft tissue abnormality. No skull fracture. IMPRESSION: 1. No acute intracranial abnormality. 2. Prominence of the sulci and ventricles compatible with brain atrophy. 3. Hypoattenuating foci in the cerebral white matter, most likely representing chronic small vessel disease. Electronically signed by: Waddell Calk MD 06/13/2024 10:15 AM EDT RP Workstation: [id number]     Procedures   Medications Ordered in the ED  losartan  (COZAAR ) tablet 100 mg (100 mg Oral Given 06/13/24 0930)  amLODipine  (NORVASC ) tablet 2.5 mg (2.5 mg Oral Given 06/13/24 0930)  ondansetron  (ZOFRAN ) injection 4 mg (4 mg Intravenous Given 06/13/24 0932)                                    Medical Decision Making Amount and/or Complexity of Data Reviewed Labs: ordered. Radiology: ordered.  Risk Prescription  drug management.   Medical Decision Making:   Theodore Lamb is a 78 y.o. male who presented to the ED today with hypertension, shob detailed above.    External chart has been reviewed including reviewed outpatient family medicine, cardiology visit. Patient's presentation is complicated by their history of CAD, HTN.  Complete initial physical exam performed, notably the patient  was hypertensive, blood pressure 2 8/84.  Improved on reevaluation, mild bradycardia, pulse 54..    Reviewed and confirmed nursing documentation for past medical history, family history, social history.    Initial Assessment:   With the patient's presentation of elevated blood pressure readings, most likely diagnosis is hypertensive urgency. Other diagnoses associated with hypertensive emergency were considered including (but not limited to) intracranial hemorrhage, acute renal artery stenosis, acute kidney injury, myocardial stress, ophthalmologic emergencies. These are considered less likely due to history of present illness and physical exam findings.   This is most consistent with an acute life/limb threatening illness complicated by underlying chronic conditions. Will evaluate for hypertensive emergency as below.  Initial Plan:   Screening labs including CBC and Metabolic panel to evaluate for infectious  or metabolic etiology of disease.  Urinalysis with reflex culture ordered to evaluate for UTI or relevant urologic/nephrologic pathology.  CXR to evaluate for structural/infectious intrathoracic pathology.  Given headache, eval for ICH with CTH EKG and single troponin to evaluate for cardiac pathology. Single troponin appropriate due to greater than 6 hours since maximal intensity of symptoms. Objective evaluation as below reviewed. Considered further administration of antihypertensives in ED, per consensus guidelines for [facility] of emergency physicians, acute treatment of hypertensive urgency alone in  the emergency department is not recommended.  If patient has evidence of hypertensive emergency on objective laboratory evaluation, will reevaluate.  Will monitor blood pressure while patient awaiting above laboratory studies.  Initial Study Results:   Laboratory  All laboratory results reviewed without evidence of clinically relevant pathology.   Exceptions include: CBC unremarkable, UA unremarkable, CMP overall unremarkable, mildly elevated creatinine, but similar to baseline.  RVP negative for COVID, flu, RSV.   EKG EKG was reviewed independently. Rate, rhythm, axis, intervals all examined and without medically relevant abnormality. ST segments without concerns for elevations.    Radiology:  All images reviewed independently.  CT head with age-related atrophy, no evidence of acute intracranial abnormality.  Plain film chest x-ray with no evidence of acute intrathoracic abnormality.  Agree with radiology report at this time.   DG Chest Portable 1 View Result Date: 06/13/2024 EXAM: 1 VIEW XRAY OF THE CHEST 06/13/2024 10:41:00 AM COMPARISON: 12/22/2023 CLINICAL HISTORY: Pt complaining of hypertension and SOB that started yesterday. Pt states he has not skipped any BP medication. Pt complaining of fatigue, nausea, and feeling light headed. FINDINGS: LUNGS AND PLEURA: Minimal airspace opacity at the left base, likely atelectasis, has improved. No pulmonary edema. No pleural effusion. No pneumothorax. HEART AND MEDIASTINUM: No acute abnormality of the cardiac and mediastinal silhouettes. Aortic atherosclerosis. BONES AND SOFT TISSUES: No acute osseous abnormality. IMPRESSION: 1. Minimal airspace opacity at the left base, likely atelectasis, has improved. Electronically signed by: Lonni Necessary MD 06/13/2024 11:29 AM EDT RP Workstation: [id number]   CT Head Wo Contrast Result Date: 06/13/2024 EXAM: CT HEAD WITHOUT CONTRAST 06/13/2024 09:49:03 AM TECHNIQUE: CT of the head was performed without the  administration of intravenous contrast. Automated exposure control, iterative reconstruction, and/or weight based adjustment of the mA/kV was utilized to reduce the radiation dose to as low as reasonably achievable. COMPARISON: 12/22/2023 CLINICAL HISTORY: Headache, neuro deficit. Table formatting from the original note was not included. Non con. Pt complaining of hypertension and SOB that started yesterday. Pt states he has not skipped any BP medication. Pt complaining of fatigue, nausea, and feeling light headed. Not complaining of any vision changes. FINDINGS: BRAIN AND VENTRICLES: No acute hemorrhage. No evidence of acute infarct. No hydrocephalus. No extra-axial collection. No mass effect or midline shift. Prominence of the sulci and ventricles compatible with brain atrophy. Hypoattenuating foci in the cerebral white matter, most likely representing chronic small vessel disease. ORBITS: No acute abnormality. SINUSES: No acute abnormality. SOFT TISSUES AND SKULL: No acute soft tissue abnormality. No skull fracture. IMPRESSION: 1. No acute intracranial abnormality. 2. Prominence of the sulci and ventricles compatible with brain atrophy. 3. Hypoattenuating foci in the cerebral white matter, most likely representing chronic small vessel disease. Electronically signed by: Waddell Calk MD 06/13/2024 10:15 AM EDT RP Workstation: [id number]     Reassessment and Plan:   On reassessment, hypertension resolved, patient is feeling okay, stable for discharge at this time.  He endorsed feeling somewhat anxious and having poor sleep  last night, this may have contributed to his symptoms this morning.   Final diagnoses:  Hypertension, unspecified type    ED Discharge Orders     None          Rosan Sherlean VEAR DEVONNA 06/13/24 1145    Towana Ozell BROCKS, MD 06/13/24 1701

## 2024-07-14 ENCOUNTER — Emergency Department (HOSPITAL_COMMUNITY)
Admission: EM | Admit: 2024-07-14 | Discharge: 2024-07-14 | Discharge status: Against Medical Advice | Attending: Emergency Medicine | Admitting: Emergency Medicine

## 2024-07-14 ENCOUNTER — Encounter (HOSPITAL_COMMUNITY): Payer: Self-pay | Admitting: Emergency Medicine

## 2024-07-14 ENCOUNTER — Other Ambulatory Visit: Payer: Self-pay

## 2024-07-14 ENCOUNTER — Emergency Department (HOSPITAL_COMMUNITY)

## 2024-07-14 DIAGNOSIS — Z5321 Procedure and treatment not carried out due to patient leaving prior to being seen by health care provider: Secondary | ICD-10-CM | POA: Insufficient documentation

## 2024-07-14 DIAGNOSIS — R0789 Other chest pain: Secondary | ICD-10-CM | POA: Diagnosis not present

## 2024-07-14 DIAGNOSIS — R079 Chest pain, unspecified: Secondary | ICD-10-CM | POA: Diagnosis not present

## 2024-07-14 DIAGNOSIS — I10 Essential (primary) hypertension: Secondary | ICD-10-CM | POA: Insufficient documentation

## 2024-07-14 LAB — BASIC METABOLIC PANEL WITH GFR
Anion gap: 11 (ref 5–15)
BUN: 19 mg/dL (ref 8–23)
CO2: 24 mmol/L (ref 22–32)
Calcium: 9.1 mg/dL (ref 8.9–10.3)
Chloride: 101 mmol/L (ref 98–111)
Creatinine, Ser: 1.32 mg/dL — ABNORMAL HIGH (ref 0.61–1.24)
GFR, Estimated: 55 mL/min — ABNORMAL LOW (ref >60)
Glucose, Bld: 95 mg/dL (ref 70–99)
Potassium: 3.5 mmol/L (ref 3.5–5.1)
Sodium: 136 mmol/L (ref 135–145)

## 2024-07-14 LAB — CBC
HCT: 43.1 % (ref 39.0–52.0)
Hemoglobin: 14.8 g/dL (ref 13.0–17.0)
MCH: 30.3 pg (ref 26.0–34.0)
MCHC: 34.3 g/dL (ref 30.0–36.0)
MCV: 88.3 fL (ref 80.0–100.0)
Platelets: 253 K/uL (ref 150–400)
RBC: 4.88 MIL/uL (ref 4.22–5.81)
RDW: 12.7 % (ref 11.5–15.5)
WBC: 11.5 K/uL — ABNORMAL HIGH (ref 4.0–10.5)
nRBC: 0 % (ref 0.0–0.2)

## 2024-07-14 LAB — TROPONIN I (HIGH SENSITIVITY): Troponin I (High Sensitivity): 7 ng/L (ref <18)

## 2024-07-14 NOTE — ED Notes (Signed)
 Pt an family stated they are leaving due to wait time.

## 2024-07-14 NOTE — ED Triage Notes (Signed)
 Pt in from home with c/o HTN - states his home reading was 221/105, prompted him to come to ED. Pt has taken all his bp meds today, denies any other complaints presently. Current pressure 160/94.

## 2024-07-14 NOTE — ED Provider Triage Note (Signed)
 Emergency Medicine Provider Triage Evaluation Note  Theodore Lamb , a 78 y.o. male  was evaluated in triage.  Pt complains of feelings of chest discomfort flushed face nausea.  He took his blood pressure at home and noted his systolic pressure was above 200 this made him concerned to came in for evaluation here his blood pressure is normalized.  He has no complaints of chest pain at this time and states that he has had a multitude of workups for this in the past.  Review of Systems  Positive: Chest discomfort Negative: Loss of consciousness  Physical Exam  BP (!) 160/94 (BP Location: Left Arm)   Pulse 61   Temp 97.9 F (36.6 C)   Resp 10   SpO2 100%  Gen:   Awake, no distress   Resp:  Normal effort  MSK:   Moves extremities without difficulty  Other:    Medical Decision Making  Medically screening exam initiated at 7:20 PM.  Appropriate orders placed.  Theodore Lamb was informed that the remainder of the evaluation will be completed by another provider, this initial triage assessment does not replace that evaluation, and the importance of remaining in the ED until their evaluation is complete.     Arloa Chroman, PA-C 07/14/24 1921

## 2024-07-20 DIAGNOSIS — H43813 Vitreous degeneration, bilateral: Secondary | ICD-10-CM | POA: Diagnosis not present

## 2024-07-20 DIAGNOSIS — D3131 Benign neoplasm of right choroid: Secondary | ICD-10-CM | POA: Diagnosis not present

## 2024-07-20 DIAGNOSIS — D3132 Benign neoplasm of left choroid: Secondary | ICD-10-CM | POA: Diagnosis not present

## 2024-07-24 ENCOUNTER — Encounter (INDEPENDENT_AMBULATORY_CARE_PROVIDER_SITE_OTHER): Payer: Self-pay

## 2024-08-14 DIAGNOSIS — R899 Unspecified abnormal finding in specimens from other organs, systems and tissues: Secondary | ICD-10-CM | POA: Diagnosis not present

## 2024-09-03 DIAGNOSIS — C61 Malignant neoplasm of prostate: Secondary | ICD-10-CM | POA: Diagnosis not present

## 2024-09-03 DIAGNOSIS — N1831 Chronic kidney disease, stage 3a: Secondary | ICD-10-CM | POA: Diagnosis not present

## 2024-09-03 DIAGNOSIS — N5235 Erectile dysfunction following radiation therapy: Secondary | ICD-10-CM | POA: Diagnosis not present

## 2024-09-03 DIAGNOSIS — N35914 Unspecified anterior urethral stricture, male: Secondary | ICD-10-CM | POA: Diagnosis not present

## 2024-10-11 ENCOUNTER — Emergency Department (HOSPITAL_COMMUNITY)
Admission: EM | Admit: 2024-10-11 | Discharge: 2024-10-12 | Disposition: A | Attending: Emergency Medicine | Admitting: Emergency Medicine

## 2024-10-11 ENCOUNTER — Encounter (HOSPITAL_COMMUNITY): Payer: Self-pay | Admitting: Emergency Medicine

## 2024-10-11 ENCOUNTER — Emergency Department (HOSPITAL_COMMUNITY)

## 2024-10-11 DIAGNOSIS — R0789 Other chest pain: Secondary | ICD-10-CM | POA: Insufficient documentation

## 2024-10-11 DIAGNOSIS — R079 Chest pain, unspecified: Secondary | ICD-10-CM

## 2024-10-11 LAB — BASIC METABOLIC PANEL WITH GFR
Anion gap: 12 (ref 5–15)
BUN: 30 mg/dL — ABNORMAL HIGH (ref 8–23)
CO2: 22 mmol/L (ref 22–32)
Calcium: 9.4 mg/dL (ref 8.9–10.3)
Chloride: 101 mmol/L (ref 98–111)
Creatinine, Ser: 1.19 mg/dL (ref 0.61–1.24)
GFR, Estimated: >60 mL/min
Glucose, Bld: 111 mg/dL — ABNORMAL HIGH (ref 70–99)
Potassium: 3.9 mmol/L (ref 3.5–5.1)
Sodium: 135 mmol/L (ref 135–145)

## 2024-10-11 LAB — CBC
HCT: 45.3 % (ref 39.0–52.0)
Hemoglobin: 15.3 g/dL (ref 13.0–17.0)
MCH: 30.6 pg (ref 26.0–34.0)
MCHC: 33.8 g/dL (ref 30.0–36.0)
MCV: 90.6 fL (ref 80.0–100.0)
Platelets: 239 K/uL (ref 150–400)
RBC: 5 MIL/uL (ref 4.22–5.81)
RDW: 12.4 % (ref 11.5–15.5)
WBC: 10 K/uL (ref 4.0–10.5)
nRBC: 0 % (ref 0.0–0.2)

## 2024-10-11 LAB — TROPONIN T, HIGH SENSITIVITY: Troponin T High Sensitivity: 16 ng/L (ref 0–19)

## 2024-10-11 NOTE — ED Triage Notes (Signed)
 Pt here from home with c/o chest pain started today , along with some sob and and slight nausea ,took 324mg  asa proir to arrival

## 2024-10-11 NOTE — ED Provider Triage Note (Signed)
 Emergency Medicine Provider Triage Evaluation Note  Theodore Lamb , a 79 y.o. male  was evaluated in triage.  Pt complains of a few episodes of chset pressure today with some SOB.  Asymptomatic currently.  Typed symptoms into AI and was told to come to the ED.  Had echo, holter monitor within the past year which was normal.  Denies prior hx of CAD.  Review of Systems  Positive: Chest pain Negative: fever  Physical Exam  BP 118/67 (BP Location: Right Arm)   Pulse (!) 53   Temp 98 F (36.7 C)   Resp 18   SpO2 97%  Gen:   Awake, no distress   Resp:  Normal effort  MSK:   Moves extremities without difficulty  Other:    Medical Decision Making  Medically screening exam initiated at 10:53 PM.  Appropriate orders placed.  Theodore Lamb was informed that the remainder of the evaluation will be completed by another provider, this initial triage assessment does not replace that evaluation, and the importance of remaining in the ED until their evaluation is complete.  Chest pain.  Resolved now.  EKG, labs, CXR.   Theodore Olam HERO, PA-C 10/11/24 2254

## 2024-10-11 NOTE — ED Provider Notes (Incomplete)
 " [place] [facility] Provider Note   CSN: [id number] Arrival date & time: 10/11/24  2218     Patient presents with: Chest Pain   Theodore Lamb is a 79 y.o. male.  {Add pertinent medical, surgical, social history, OB history to YEP:67052} Patient presents to the emergency department for evaluation of chest discomfort.  Patient reports that he has not been feeling well for a couple of weeks, thought he had the flu.  Still experiencing nasal congestion and and feeling weak.  For the last couple of days, however, he has been having episodes of feeling heaviness in his chest with shortness of breath.  He reports that he feels like he gets out of breath walking around over the last couple of days.  He had an episode tonight that was somewhat severe, felt very weak, like he may be getting ready to pass out in association with the chest heaviness and shortness of breath.  Currently, however, patient reports his symptoms have resolved.       Prior to Admission medications  Medication Sig Start Date End Date Taking? Authorizing Provider  amLODipine  (NORVASC ) 2.5 MG tablet Take 1 tablet (2.5 mg total) by mouth daily. 05/13/23   Nahser, Aleene PARAS, MD  atorvastatin  (LIPITOR) 20 MG tablet Take 1 tablet (20 mg total) by mouth daily. 10/30/23 12/31/23  Wyn Jackee VEAR Mickey., NP  ezetimibe  (ZETIA ) 10 MG tablet Take 1 tablet (10 mg total) by mouth daily. 10/30/23   Wyn Jackee VEAR Mickey., NP  hydrochlorothiazide  (MICROZIDE ) 12.5 MG capsule Take 1 capsule (12.5 mg total) by mouth daily. 10/30/23 01/28/24  Wyn Jackee VEAR Mickey., NP  losartan  (COZAAR ) 100 MG tablet Take 0.5 tablets (50 mg total) by mouth 2 (two) times daily. 10/30/23   Wyn Jackee VEAR Mickey., NP  metoprolol  tartrate (LOPRESSOR ) 100 MG tablet Take 1 tablet by mouth two hours prior to scan 12/31/23   Nahser, Aleene PARAS, MD    Allergies: Bupropion, Candesartan cilexetil, and Leuprolide     Review of Systems  Updated Vital  Signs BP 118/67 (BP Location: Right Arm)   Pulse (!) 53   Temp 98 F (36.7 C)   Resp 18   SpO2 97%   Physical Exam Vitals and nursing note reviewed.  Constitutional:      General: He is not in acute distress.    Appearance: He is well-developed.  HENT:     Head: Normocephalic and atraumatic.     Mouth/Throat:     Mouth: Mucous membranes are moist.  Eyes:     General: Vision grossly intact. Gaze aligned appropriately.     Extraocular Movements: Extraocular movements intact.     Conjunctiva/sclera: Conjunctivae normal.  Cardiovascular:     Rate and Rhythm: Normal rate and regular rhythm.     Pulses: Normal pulses.     Heart sounds: Normal heart sounds, S1 normal and S2 normal. No murmur heard.    No friction rub. No gallop.  Pulmonary:     Effort: Pulmonary effort is normal. No respiratory distress.     Breath sounds: Normal breath sounds.  Abdominal:     Palpations: Abdomen is soft.     Tenderness: There is no abdominal tenderness. There is no guarding or rebound.     Hernia: No hernia is present.  Musculoskeletal:        General: No swelling.     Cervical back: Full passive range of motion without pain, normal range of motion and  neck supple. No pain with movement, spinous process tenderness or muscular tenderness. Normal range of motion.     Right lower leg: No edema.     Left lower leg: No edema.  Skin:    General: Skin is warm and dry.     Capillary Refill: Capillary refill takes less than 2 seconds.     Findings: No ecchymosis, erythema, lesion or wound.  Neurological:     Mental Status: He is alert and oriented to person, place, and time.     GCS: GCS eye subscore is 4. GCS verbal subscore is 5. GCS motor subscore is 6.     Cranial Nerves: Cranial nerves 2-12 are intact.     Sensory: Sensation is intact.     Motor: Motor function is intact. No weakness or abnormal muscle tone.     Coordination: Coordination is intact.  Psychiatric:        Mood and Affect: Mood  normal.        Speech: Speech normal.        Behavior: Behavior normal.     (all labs ordered are listed, but only abnormal results are displayed) Labs Reviewed  BASIC METABOLIC PANEL WITH GFR - Abnormal; Notable for the following components:      Result Value   Glucose, Bld 111 (*)    BUN 30 (*)    All other components within normal limits  CBC  TROPONIN T, HIGH SENSITIVITY    EKG: EKG Interpretation Date/Time:  Sunday October 11 2024 22:41:58 EST Ventricular Rate:  55 PR Interval:  206 QRS Duration:  144 QT Interval:  446 QTC Calculation: 426 R Axis:   52  Text Interpretation: Sinus bradycardia Right bundle branch block Abnormal ECG When compared with ECG of 14-Jul-2024 19:41, No acute changes Confirmed by Haze Lonni PARAS [phone]) on 10/11/2024 11:15:50 PM  Radiology: No results found.  {Document cardiac monitor, telemetry assessment procedure when appropriate:32947} Procedures   Medications Ordered in the ED - No data to display    {Click here for ABCD2, HEART and other calculators REFRESH Note before signing:1}                              Medical Decision Making Amount and/or Complexity of Data Reviewed External Data Reviewed: radiology, ECG and notes.    Details: CT coronary FFR 02/26/2024: FINDINGS: 1. Left Main: 0.96; low likelihood of hemodynamic significance.   2. LAD: 0.88; low likelihood of hemodynamic significance. 3. D2: 0.86; low likelihood of hemodynamic significance. 4. LCX: 0.92; low likelihood of hemodynamic significance. 5. Mid RCA: 0.93; low likelihood of hemodynamic significance. 6. RPDA: 0.81; low likelihood of hemodynamic significance. 7. RPLV: 0.91; low likelihood of hemodynamic significance.    Labs: ordered. Decision-making details documented in ED Course. Radiology: ordered.   ***  {Document critical care time when appropriate  Document review of labs and clinical decision tools ie CHADS2VASC2, etc  Document your  independent review of radiology images and any outside records  Document your discussion with family members, caretakers and with consultants  Document social determinants of health affecting pt's care  Document your decision making why or why not admission, treatments were needed:32947:::1}   Final diagnoses:  None    ED Discharge Orders     None        "

## 2024-10-11 NOTE — ED Provider Notes (Signed)
 " [place] [facility] Provider Note   CSN: [id number] Arrival date & time: 10/11/24  2218     Patient presents with: Chest Pain   Theodore Lamb is a 79 y.o. male.   Patient presents to the emergency department for evaluation of chest discomfort.  Patient reports that he has not been feeling well for a couple of weeks, thought he had the flu.  Still experiencing nasal congestion and and feeling weak.  For the last couple of days, however, he has been having episodes of feeling heaviness in his chest with shortness of breath.  He reports that he feels like he gets out of breath walking around over the last couple of days.  He had an episode tonight that was somewhat severe, felt very weak, like he may be getting ready to pass out in association with the chest heaviness and shortness of breath.  Currently, however, patient reports his symptoms have resolved.       Prior to Admission medications  Medication Sig Start Date End Date Taking? Authorizing Provider  amLODipine  (NORVASC ) 2.5 MG tablet Take 1 tablet (2.5 mg total) by mouth daily. 05/13/23   Nahser, Aleene PARAS, MD  atorvastatin  (LIPITOR) 20 MG tablet Take 1 tablet (20 mg total) by mouth daily. 10/30/23 12/31/23  Wyn Jackee VEAR Mickey., NP  ezetimibe  (ZETIA ) 10 MG tablet Take 1 tablet (10 mg total) by mouth daily. 10/30/23   Wyn Jackee VEAR Mickey., NP  hydrochlorothiazide  (MICROZIDE ) 12.5 MG capsule Take 1 capsule (12.5 mg total) by mouth daily. 10/30/23 01/28/24  Wyn Jackee VEAR Mickey., NP  losartan  (COZAAR ) 100 MG tablet Take 0.5 tablets (50 mg total) by mouth 2 (two) times daily. 10/30/23   Wyn Jackee VEAR Mickey., NP  metoprolol  tartrate (LOPRESSOR ) 100 MG tablet Take 1 tablet by mouth two hours prior to scan 12/31/23   Nahser, Aleene PARAS, MD    Allergies: Bupropion, Candesartan cilexetil, and Leuprolide     Review of Systems  Updated Vital Signs BP 98/62   Pulse (!) 50   Temp 98 F (36.7 C)   Resp 15   SpO2 94%    Physical Exam Vitals and nursing note reviewed.  Constitutional:      General: He is not in acute distress.    Appearance: He is well-developed.  HENT:     Head: Normocephalic and atraumatic.     Mouth/Throat:     Mouth: Mucous membranes are moist.  Eyes:     General: Vision grossly intact. Gaze aligned appropriately.     Extraocular Movements: Extraocular movements intact.     Conjunctiva/sclera: Conjunctivae normal.  Cardiovascular:     Rate and Rhythm: Normal rate and regular rhythm.     Pulses: Normal pulses.     Heart sounds: Normal heart sounds, S1 normal and S2 normal. No murmur heard.    No friction rub. No gallop.  Pulmonary:     Effort: Pulmonary effort is normal. No respiratory distress.     Breath sounds: Normal breath sounds.  Abdominal:     Palpations: Abdomen is soft.     Tenderness: There is no abdominal tenderness. There is no guarding or rebound.     Hernia: No hernia is present.  Musculoskeletal:        General: No swelling.     Cervical back: Full passive range of motion without pain, normal range of motion and neck supple. No pain with movement, spinous process tenderness or muscular tenderness. Normal  range of motion.     Right lower leg: No edema.     Left lower leg: No edema.  Skin:    General: Skin is warm and dry.     Capillary Refill: Capillary refill takes less than 2 seconds.     Findings: No ecchymosis, erythema, lesion or wound.  Neurological:     Mental Status: He is alert and oriented to person, place, and time.     GCS: GCS eye subscore is 4. GCS verbal subscore is 5. GCS motor subscore is 6.     Cranial Nerves: Cranial nerves 2-12 are intact.     Sensory: Sensation is intact.     Motor: Motor function is intact. No weakness or abnormal muscle tone.     Coordination: Coordination is intact.  Psychiatric:        Mood and Affect: Mood normal.        Speech: Speech normal.        Behavior: Behavior normal.     (all labs ordered are  listed, but only abnormal results are displayed) Labs Reviewed  BASIC METABOLIC PANEL WITH GFR - Abnormal; Notable for the following components:      Result Value   Glucose, Bld 111 (*)    BUN 30 (*)    All other components within normal limits  CBC  TROPONIN T, HIGH SENSITIVITY  TROPONIN T, HIGH SENSITIVITY    EKG: EKG Interpretation Date/Time:  Sunday October 11 2024 22:41:58 EST Ventricular Rate:  55 PR Interval:  206 QRS Duration:  144 QT Interval:  446 QTC Calculation: 426 R Axis:   52  Text Interpretation: Sinus bradycardia Right bundle branch block Abnormal ECG When compared with ECG of 14-Jul-2024 19:41, No acute changes Confirmed by Haze Lonni PARAS [phone]) on 10/11/2024 11:15:50 PM  Radiology: CT Angio Chest Pulmonary Embolism (PE) W or WO Contrast Result Date: 10/12/2024 EXAM: CTA of the Chest with contrast for PE 10/12/2024 03:16:05 AM TECHNIQUE: CTA of the chest was performed without and with the administration of 75 mL of intravenous iohexol  (OMNIPAQUE ) 350 MG/ML injection. Multiplanar reformatted images are provided for review. MIP images are provided for review. Automated exposure control, iterative reconstruction, and/or weight based adjustment of the mA/kV was utilized to reduce the radiation dose to as low as reasonably achievable. COMPARISON: Chest radiograph dated 10/11/2024 and cardiac CT chest dated 02/26/2024. CLINICAL HISTORY: Pulmonary embolism (PE) suspected, high prob. Chest pain, shortness of breath FINDINGS: PULMONARY ARTERIES: Pulmonary arteries are adequately opacified for evaluation. No pulmonary embolism. Main pulmonary artery is normal in caliber. MEDIASTINUM: The heart and pericardium demonstrate no acute abnormality. Thoracic aortic atherosclerosis. LYMPH NODES: No mediastinal, hilar or axillary lymphadenopathy. LUNGS AND PLEURA: Mild centrilobular and paraseptal emphysematous changes, upper lobe predominant. No focal consolidation or pulmonary  edema. No pleural effusion or pneumothorax. UPPER ABDOMEN: Limited images of the upper abdomen are unremarkable. SOFT TISSUES AND BONES: Degenerative changes of the visualized thoracolumbar spine. No acute soft tissue abnormality. IMPRESSION: 1. No pulmonary embolism. 2. Negative CT chest. Electronically signed by: Pinkie Pebbles MD MD 10/12/2024 03:22 AM EST RP Workstation: [id number]   DG Chest 2 View Result Date: 10/11/2024 EXAM: 2 VIEW(S) XRAY OF THE CHEST 10/11/2024 11:08:23 PM COMPARISON: 07/14/2024 CLINICAL HISTORY: cp jennie FINDINGS: LUNGS AND PLEURA: No focal pulmonary opacity. No pleural effusion. No pneumothorax. HEART AND MEDIASTINUM: Aortic atherosclerosis. No acute abnormality of the cardiac and mediastinal silhouettes. BONES AND SOFT TISSUES: Multilevel bridging enthesopathy of thoracic spine. IMPRESSION: 1. No acute  findings. Electronically signed by: Morgane Naveau MD MD 10/11/2024 11:27 PM EST RP Workstation: [id number]     Procedures   Medications Ordered in the ED  iohexol  (OMNIPAQUE ) 350 MG/ML injection 75 mL (75 mLs Intravenous Contrast Given 10/12/24 0317)                                    Medical Decision Making Amount and/or Complexity of Data Reviewed External Data Reviewed: radiology, ECG and notes.    Details: CT coronary FFR 02/26/2024: FINDINGS: 1. Left Main: 0.96; low likelihood of hemodynamic significance.   2. LAD: 0.88; low likelihood of hemodynamic significance. 3. D2: 0.86; low likelihood of hemodynamic significance. 4. LCX: 0.92; low likelihood of hemodynamic significance. 5. Mid RCA: 0.93; low likelihood of hemodynamic significance. 6. RPDA: 0.81; low likelihood of hemodynamic significance. 7. RPLV: 0.91; low likelihood of hemodynamic significance.    Labs: ordered. Decision-making details documented in ED Course. Radiology: ordered.  Risk Prescription drug management.   Differential Diagnosis considered includes, but not limited to: STEMI;  NSTEMI; myocarditis; pericarditis; pulmonary embolism; aortic dissection; pneumothorax; pneumonia; gastritis; musculoskeletal pain  Presents to the emergency department for evaluation of chest pain.  He reports that he was recently ill with upper respiratory infection around Christmas time and does not feel like he has completely recovered.  Patient has been experiencing dyspnea on exertion with some pain over the last few days.  Patient concerned that he might have developed pneumonia.  He does not, however, have any persistent or productive cough currently.  Reviewing his records reveals he did have cardiac CT that did not show any significant blockages.  Serial troponins without elevation.  EKG without changes.  Because of the breathing findings with somewhat atypical pain, PE considered.  Patient underwent CT angiography of chest that did not show any evidence of PE or other pathology in the lungs.  Workup has been reassuring.  At this point, I feel the patient would be safe for discharge as he has improved here in the ED without intervention.  He can follow-up with cardiology in the office.  Given return precautions.     Final diagnoses:  Chest pain, unspecified type    ED Discharge Orders          Ordered    Ambulatory referral to Cardiology       Comments: If you have not heard from the Cardiology office within the next 72 hours please call [phone].   10/12/24 0403               Haze Lonni PARAS, MD 10/12/24 0404  "

## 2024-10-12 ENCOUNTER — Emergency Department (HOSPITAL_COMMUNITY)

## 2024-10-12 ENCOUNTER — Encounter (INDEPENDENT_AMBULATORY_CARE_PROVIDER_SITE_OTHER): Payer: Self-pay

## 2024-10-12 ENCOUNTER — Ambulatory Visit (INDEPENDENT_AMBULATORY_CARE_PROVIDER_SITE_OTHER)

## 2024-10-12 VITALS — BP 137/77 | HR 53 | Ht 68.0 in | Wt 200.0 lb

## 2024-10-12 DIAGNOSIS — H9193 Unspecified hearing loss, bilateral: Secondary | ICD-10-CM | POA: Diagnosis not present

## 2024-10-12 LAB — TROPONIN T, HIGH SENSITIVITY: Troponin T High Sensitivity: <15 ng/L (ref 0–19)

## 2024-10-12 MED ORDER — IOHEXOL 350 MG/ML SOLN
75.0000 mL | Freq: Once | INTRAVENOUS | Status: AC | PRN
  Administered 2024-10-12: 75 mL via INTRAVENOUS

## 2024-10-12 NOTE — Progress Notes (Unsigned)
 Dear Dr. Okey, Here is my assessment for our mutual patient, Theodore Lamb. Thank you for allowing me the opportunity to care for your patient. Please do not hesitate to contact me should you have any other questions. Sincerely, Dr. Hadassah Parody  Otolaryngology Clinic Note Referring provider: Dr. Okey HPI:   Initial HPI (10/12/2024) Theodore Lamb is a 79 y.o. male kindly referred by Dr. Okey for evaluation of ***.   H&N Surgery: *** Personal or FHx of bleeding dz or anesthesia difficulty: no ***  GLP-1: *** AP/AC: ***  Tobacco: *** Alcohol: ***.  Independent Review of Additional Tests or Records:  Referral note 04/24/2024 Theodore Okey, MD: Progressive hearing loss, refer to ENT  CT head without contrast 06/13/2024 independently reviewed showing aerated mastoids bilaterally   PMH/Meds/All/SocHx/FamHx/ROS:   Past Medical History:  Diagnosis Date   Cancer of floor of mouth (HCC) 2007   Cervical spondylosis    Depression    FH: alcoholism    H/O leukocytosis    leukocytosis and erythrocytosis   Hypertension    Nuclear sclerotic cataract of both eyes 10/31/2020   Prostate cancer (HCC)    S/P radiation therapy 02/23/2015 through 04/21/2015     Prostate 7800 cGy in 40 sessions, seminal vesicles, and pelvic lymph nodes 5600 cGy in 40 sessions                         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     Past Surgical History:  Procedure Laterality Date   LESION REMOVAL  2007   floor of mouth   TONSILLECTOMY AND ADENOIDECTOMY      Family History  Problem Relation Age of Onset   Testicular cancer Paternal Uncle    Colon cancer Other      Social Connections: Not on file     Current Outpatient Medications  Medication Instructions   amLODipine  (NORVASC ) 2.5 mg, Oral, Daily   atorvastatin  (LIPITOR) 20 mg, Oral, Daily   ezetimibe  (ZETIA ) 10 mg, Oral, Daily   hydrochlorothiazide  (MICROZIDE ) 12.5 mg, Oral, Daily   losartan   (COZAAR ) 50 mg, Oral, 2 times daily   metoprolol  tartrate (LOPRESSOR ) 100 MG tablet Take 1 tablet by mouth two hours prior to scan     Physical Exam:   BP 137/77 (BP Location: Right Arm, Patient Position: Sitting)   Pulse (!) 53   Ht 5' 8 (1.727 m)   Wt 200 lb (90.7 kg)   SpO2 93%   BMI 30.41 kg/m   Salient findings:  CN II-XII intact ***with the exception of CNVIII as above***  *** Bilateral EAC clear and TM intact with well pneumatized middle ear spaces Weber 512: *** Rinne 512: AC > BC b/l *** Rine 1024: AC > BC b/l ***  Anterior rhinoscopy: Septum ***; bilateral inferior turbinates with ***  No lesions of oral cavity/oropharynx; dentition ***  No obviously palpable neck masses/lymphadenopathy/thyromegaly  No respiratory distress or stridor***  Seprately Identifiable Procedures:  Prior to initiating any procedures, risks/benefits/alternatives were explained to the patient and verbal consent obtained. None***  Impression & Plans:  Theodore Lamb is a 79 y.o. male with ***  No diagnosis found.   See below regarding exact medications prescribed this encounter including dosages and route: No orders of the defined types were placed in this encounter.     Thank you for allowing me the opportunity to care for your patient. Please do not hesitate to contact  me should you have any other questions.  Sincerely, Hadassah Parody, MD Otolaryngologist (ENT), [facility] Phone: [phone] Fax: [phone]  MDM:  Level *** Complexity/Problems addressed: ***- *** Data complexity: ***-  independent review of *** - Morbidity: ***- ***  - Prescription Drug prescribed or managed: ***

## 2024-10-12 NOTE — ED Notes (Signed)
 Patient transported to CT

## 2024-10-26 ENCOUNTER — Ambulatory Visit: Admitting: Physician Assistant

## 2024-11-02 ENCOUNTER — Ambulatory Visit: Admitting: Physician Assistant

## 2024-11-13 ENCOUNTER — Ambulatory Visit (INDEPENDENT_AMBULATORY_CARE_PROVIDER_SITE_OTHER): Admitting: Audiology

## 2024-11-13 ENCOUNTER — Ambulatory Visit (INDEPENDENT_AMBULATORY_CARE_PROVIDER_SITE_OTHER)

## 2024-12-09 ENCOUNTER — Ambulatory Visit: Admitting: Physician Assistant

## 2024-12-15 ENCOUNTER — Ambulatory Visit: Admitting: Cardiology
